# Patient Record
Sex: Female | Born: 1945 | Race: White | Hispanic: No | Smoking: Former smoker
Health system: Southern US, Community
[De-identification: ages and names within clinical notes are randomized; demographics above are authoritative.]

## PROBLEM LIST (undated history)

## (undated) DIAGNOSIS — F329 Major depressive disorder, single episode, unspecified: Secondary | ICD-10-CM

## (undated) DIAGNOSIS — R0600 Dyspnea, unspecified: Secondary | ICD-10-CM

## (undated) DIAGNOSIS — N289 Disorder of kidney and ureter, unspecified: Secondary | ICD-10-CM

## (undated) DIAGNOSIS — F32A Depression, unspecified: Secondary | ICD-10-CM

## (undated) DIAGNOSIS — E7800 Pure hypercholesterolemia, unspecified: Secondary | ICD-10-CM

## (undated) DIAGNOSIS — E039 Hypothyroidism, unspecified: Secondary | ICD-10-CM

## (undated) DIAGNOSIS — R413 Other amnesia: Secondary | ICD-10-CM

## (undated) DIAGNOSIS — J449 Chronic obstructive pulmonary disease, unspecified: Secondary | ICD-10-CM

## (undated) DIAGNOSIS — C801 Malignant (primary) neoplasm, unspecified: Secondary | ICD-10-CM

## (undated) HISTORY — DX: Hypothyroidism, unspecified: E03.9

## (undated) HISTORY — DX: Other amnesia: R41.3

## (undated) HISTORY — PX: TIBIA FRACTURE SURGERY: SHX806

## (undated) HISTORY — PX: ANKLE FRACTURE SURGERY: SHX122

## (undated) HISTORY — PX: OTHER SURGICAL HISTORY: SHX169

---

## 2006-09-27 ENCOUNTER — Ambulatory Visit (HOSPITAL_COMMUNITY): Admission: RE | Admit: 2006-09-27 | Discharge: 2006-09-27 | Payer: Self-pay | Admitting: Family Medicine

## 2006-10-16 ENCOUNTER — Ambulatory Visit: Payer: Self-pay | Admitting: Gastroenterology

## 2007-08-25 ENCOUNTER — Ambulatory Visit (HOSPITAL_COMMUNITY): Admission: RE | Admit: 2007-08-25 | Discharge: 2007-08-25 | Payer: Self-pay | Admitting: Gastroenterology

## 2007-08-25 ENCOUNTER — Ambulatory Visit: Payer: Self-pay | Admitting: Gastroenterology

## 2007-08-25 ENCOUNTER — Encounter: Payer: Self-pay | Admitting: Gastroenterology

## 2009-11-25 ENCOUNTER — Emergency Department (HOSPITAL_COMMUNITY): Admission: EM | Admit: 2009-11-25 | Discharge: 2009-11-25 | Payer: Self-pay | Admitting: Emergency Medicine

## 2011-04-17 NOTE — Op Note (Signed)
NAMEJAYLIANA, Laura Foster             ACCOUNT NO.:  [id number]   MEDICAL RECORD NO.:  [id number]          PATIENT TYPE:  AMB   LOCATION:  DAY                           FACILITY:  [facility]   PHYSICIAN:  Kassie Mends, M.D.      DATE OF BIRTH:  07/05/1946   DATE OF PROCEDURE:  08/25/2007  DATE OF DISCHARGE:                               OPERATIVE REPORT   PROCEDURE:  Colonoscopy with cold forceps and snare cautery polypectomy.   INDICATION FOR EXAM:  Laura Foster is a 66 year old female who presents  for average risk colon cancer screening.   FINDINGS:  1. Multiple sessile rectal polyps seen.  The polyps ranged from 4 mm      to 8 mm.  The polyps which were less than 6 mm were removed via      cold forceps.  The polyps over 6 mm were removed via snare cautery.  2. Rare sigmoid diverticulosis.  3. Otherwise no masses or inflammatory changes, AVMs or hemorrhoids      seen.   RECOMMENDATIONS:  1. She should follow a high fiber diet.  She is given a handout on      high-fiber diet polyps and diverticulosis.  2. Will call Ms. Turski with results of her biopsies.  If polyps      are adenomatous, then she needs a screening colonoscopy in 5 years.      As well her siblings and children need to begin colon cancer      screening at age 52 and then every 10 years, because her      adenomatous polyps would have been diagnosed after the age of 75.  3. No aspirin, NSAIDs or anticoagulation for 7 days.   MEDICATIONS:  1. Demerol 50 mg IV.  2. Versed 5 mg IV.   PROCEDURE TECHNIQUE:  Physical exam was performed.  Informed consent was  obtained from the patient after explaining the benefits, risks and  alternatives to the procedure.  The patient was connected to the monitor  and placed in the left lateral position.  Continuous oxygen was supplied  by nasal cannula.  IV medicine administered through an indwelling  cannula.  After administration of sedation and rectal exam, the  patient's rectum was  intubated.  The scope  was advanced under direct visualization to the cecum.  The scope was  removed slowly by carefully examine the color, texture, anatomy and  integrity of the mucosa on the way out.  The patient was recovered in  endoscopy and discharged home in satisfactory condition.      Kassie Mends, M.D.  Electronically Signed     SM/MEDQ  D:  08/25/2007  T:  08/25/2007  Job:  [id number]   cc:   Patrica Duel, M.D.  Fax: [phone]
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
## 2012-03-26 ENCOUNTER — Other Ambulatory Visit (HOSPITAL_COMMUNITY): Payer: Self-pay | Admitting: Internal Medicine

## 2012-03-26 DIAGNOSIS — Z139 Encounter for screening, unspecified: Secondary | ICD-10-CM

## 2012-03-26 DIAGNOSIS — IMO0002 Reserved for concepts with insufficient information to code with codable children: Secondary | ICD-10-CM | POA: Diagnosis not present

## 2012-03-26 DIAGNOSIS — K219 Gastro-esophageal reflux disease without esophagitis: Secondary | ICD-10-CM | POA: Diagnosis not present

## 2012-03-27 DIAGNOSIS — K219 Gastro-esophageal reflux disease without esophagitis: Secondary | ICD-10-CM | POA: Diagnosis not present

## 2012-03-27 DIAGNOSIS — Z79899 Other long term (current) drug therapy: Secondary | ICD-10-CM | POA: Diagnosis not present

## 2012-03-28 ENCOUNTER — Ambulatory Visit (HOSPITAL_COMMUNITY): Payer: Self-pay

## 2012-04-03 ENCOUNTER — Ambulatory Visit (HOSPITAL_COMMUNITY)
Admission: RE | Admit: 2012-04-03 | Discharge: 2012-04-03 | Disposition: A | Discharge status: Home / Self Care | Payer: Medicare Other | Source: Ambulatory Visit | Attending: Internal Medicine | Admitting: Internal Medicine

## 2012-04-03 DIAGNOSIS — Z13820 Encounter for screening for osteoporosis: Secondary | ICD-10-CM | POA: Diagnosis not present

## 2012-04-03 DIAGNOSIS — Z1231 Encounter for screening mammogram for malignant neoplasm of breast: Secondary | ICD-10-CM | POA: Insufficient documentation

## 2012-04-03 DIAGNOSIS — Z139 Encounter for screening, unspecified: Secondary | ICD-10-CM

## 2012-04-03 DIAGNOSIS — M810 Age-related osteoporosis without current pathological fracture: Secondary | ICD-10-CM | POA: Diagnosis not present

## 2012-04-03 DIAGNOSIS — N63 Unspecified lump in unspecified breast: Secondary | ICD-10-CM | POA: Diagnosis not present

## 2012-04-03 DIAGNOSIS — Z780 Asymptomatic menopausal state: Secondary | ICD-10-CM | POA: Diagnosis not present

## 2012-04-07 ENCOUNTER — Other Ambulatory Visit: Payer: Self-pay | Admitting: Internal Medicine

## 2012-04-07 DIAGNOSIS — R928 Other abnormal and inconclusive findings on diagnostic imaging of breast: Secondary | ICD-10-CM

## 2012-04-16 ENCOUNTER — Ambulatory Visit (HOSPITAL_COMMUNITY)
Admission: RE | Admit: 2012-04-16 | Discharge: 2012-04-16 | Disposition: A | Discharge status: Home / Self Care | Payer: Medicare Other | Source: Ambulatory Visit | Attending: Internal Medicine | Admitting: Internal Medicine

## 2012-04-16 DIAGNOSIS — R928 Other abnormal and inconclusive findings on diagnostic imaging of breast: Secondary | ICD-10-CM | POA: Diagnosis not present

## 2012-04-30 DIAGNOSIS — IMO0002 Reserved for concepts with insufficient information to code with codable children: Secondary | ICD-10-CM | POA: Diagnosis not present

## 2012-04-30 DIAGNOSIS — E039 Hypothyroidism, unspecified: Secondary | ICD-10-CM | POA: Diagnosis not present

## 2012-06-13 DIAGNOSIS — E0590 Thyrotoxicosis, unspecified without thyrotoxic crisis or storm: Secondary | ICD-10-CM | POA: Diagnosis not present

## 2012-06-17 DIAGNOSIS — E039 Hypothyroidism, unspecified: Secondary | ICD-10-CM | POA: Insufficient documentation

## 2012-06-20 DIAGNOSIS — E079 Disorder of thyroid, unspecified: Secondary | ICD-10-CM | POA: Diagnosis not present

## 2012-08-08 DIAGNOSIS — E079 Disorder of thyroid, unspecified: Secondary | ICD-10-CM | POA: Diagnosis not present

## 2012-08-15 ENCOUNTER — Other Ambulatory Visit (HOSPITAL_COMMUNITY): Payer: Self-pay | Admitting: "Endocrinology

## 2012-08-15 DIAGNOSIS — E0590 Thyrotoxicosis, unspecified without thyrotoxic crisis or storm: Secondary | ICD-10-CM | POA: Diagnosis not present

## 2012-08-18 ENCOUNTER — Ambulatory Visit (HOSPITAL_COMMUNITY)
Admission: RE | Admit: 2012-08-18 | Discharge: 2012-08-18 | Disposition: A | Discharge status: Home / Self Care | Payer: Medicare Other | Source: Ambulatory Visit | Attending: "Endocrinology | Admitting: "Endocrinology

## 2012-08-18 ENCOUNTER — Encounter (HOSPITAL_COMMUNITY): Payer: Self-pay

## 2012-08-18 DIAGNOSIS — E0590 Thyrotoxicosis, unspecified without thyrotoxic crisis or storm: Secondary | ICD-10-CM

## 2012-08-18 HISTORY — DX: Disorder of kidney and ureter, unspecified: N28.9

## 2012-08-18 MED ORDER — SODIUM IODIDE I 131 CAPSULE
10.0000 | Freq: Once | INTRAVENOUS | Status: AC | PRN
  Administered 2012-08-18: 11 via ORAL

## 2012-08-19 ENCOUNTER — Encounter (HOSPITAL_COMMUNITY)
Admission: RE | Admit: 2012-08-19 | Discharge: 2012-08-19 | Disposition: A | Discharge status: Home / Self Care | Payer: Medicare Other | Source: Ambulatory Visit | Attending: "Endocrinology | Admitting: "Endocrinology

## 2012-08-19 DIAGNOSIS — E0520 Thyrotoxicosis with toxic multinodular goiter without thyrotoxic crisis or storm: Secondary | ICD-10-CM | POA: Diagnosis not present

## 2012-08-19 MED ORDER — SODIUM PERTECHNETATE TC 99M INJECTION
10.0000 | Freq: Once | INTRAVENOUS | Status: AC | PRN
  Administered 2012-08-19: 10 via INTRAVENOUS

## 2012-08-26 DIAGNOSIS — H2589 Other age-related cataract: Secondary | ICD-10-CM | POA: Diagnosis not present

## 2012-08-29 ENCOUNTER — Other Ambulatory Visit (HOSPITAL_COMMUNITY): Payer: Self-pay | Admitting: "Endocrinology

## 2012-08-29 DIAGNOSIS — E0510 Thyrotoxicosis with toxic single thyroid nodule without thyrotoxic crisis or storm: Secondary | ICD-10-CM

## 2012-09-01 ENCOUNTER — Other Ambulatory Visit (HOSPITAL_COMMUNITY): Payer: Self-pay | Admitting: "Endocrinology

## 2012-09-01 DIAGNOSIS — E0510 Thyrotoxicosis with toxic single thyroid nodule without thyrotoxic crisis or storm: Secondary | ICD-10-CM

## 2012-09-02 ENCOUNTER — Ambulatory Visit (HOSPITAL_BASED_OUTPATIENT_CLINIC_OR_DEPARTMENT_OTHER): Admission: RE | Admit: 2012-09-02 | Payer: Medicare Other | Source: Ambulatory Visit

## 2012-09-02 ENCOUNTER — Encounter (HOSPITAL_COMMUNITY)
Admission: RE | Admit: 2012-09-02 | Discharge: 2012-09-02 | Disposition: A | Discharge status: Home / Self Care | Payer: Medicare Other | Source: Ambulatory Visit | Attending: "Endocrinology | Admitting: "Endocrinology

## 2012-09-02 ENCOUNTER — Encounter (HOSPITAL_COMMUNITY): Payer: Self-pay

## 2012-09-02 DIAGNOSIS — E0510 Thyrotoxicosis with toxic single thyroid nodule without thyrotoxic crisis or storm: Secondary | ICD-10-CM | POA: Diagnosis not present

## 2012-09-02 DIAGNOSIS — E0590 Thyrotoxicosis, unspecified without thyrotoxic crisis or storm: Secondary | ICD-10-CM | POA: Diagnosis not present

## 2012-09-02 MED ORDER — SODIUM IODIDE I 131 CAPSULE
30.0000 | Freq: Once | INTRAVENOUS | Status: AC | PRN
  Administered 2012-09-02: 30 via ORAL

## 2012-10-03 DIAGNOSIS — E0510 Thyrotoxicosis with toxic single thyroid nodule without thyrotoxic crisis or storm: Secondary | ICD-10-CM | POA: Diagnosis not present

## 2012-10-10 DIAGNOSIS — E0590 Thyrotoxicosis, unspecified without thyrotoxic crisis or storm: Secondary | ICD-10-CM | POA: Diagnosis not present

## 2012-11-07 DIAGNOSIS — E0590 Thyrotoxicosis, unspecified without thyrotoxic crisis or storm: Secondary | ICD-10-CM | POA: Diagnosis not present

## 2012-11-10 DIAGNOSIS — H2510 Age-related nuclear cataract, unspecified eye: Secondary | ICD-10-CM | POA: Diagnosis not present

## 2012-11-11 DIAGNOSIS — H2510 Age-related nuclear cataract, unspecified eye: Secondary | ICD-10-CM | POA: Diagnosis not present

## 2012-11-14 DIAGNOSIS — E018 Other iodine-deficiency related thyroid disorders and allied conditions: Secondary | ICD-10-CM | POA: Diagnosis not present

## 2012-11-19 ENCOUNTER — Ambulatory Visit: Payer: Self-pay | Admitting: Ophthalmology

## 2012-11-19 DIAGNOSIS — R0602 Shortness of breath: Secondary | ICD-10-CM | POA: Diagnosis not present

## 2012-11-19 DIAGNOSIS — H2510 Age-related nuclear cataract, unspecified eye: Secondary | ICD-10-CM | POA: Diagnosis not present

## 2012-11-19 DIAGNOSIS — Z79899 Other long term (current) drug therapy: Secondary | ICD-10-CM | POA: Diagnosis not present

## 2012-11-19 DIAGNOSIS — M810 Age-related osteoporosis without current pathological fracture: Secondary | ICD-10-CM | POA: Diagnosis not present

## 2012-11-19 DIAGNOSIS — F17200 Nicotine dependence, unspecified, uncomplicated: Secondary | ICD-10-CM | POA: Diagnosis not present

## 2012-11-19 DIAGNOSIS — R05 Cough: Secondary | ICD-10-CM | POA: Diagnosis not present

## 2012-11-19 DIAGNOSIS — K219 Gastro-esophageal reflux disease without esophagitis: Secondary | ICD-10-CM | POA: Diagnosis not present

## 2012-11-19 DIAGNOSIS — R059 Cough, unspecified: Secondary | ICD-10-CM | POA: Diagnosis not present

## 2012-11-19 DIAGNOSIS — R51 Headache: Secondary | ICD-10-CM | POA: Diagnosis not present

## 2012-11-19 DIAGNOSIS — E7800 Pure hypercholesterolemia, unspecified: Secondary | ICD-10-CM | POA: Diagnosis not present

## 2012-11-19 DIAGNOSIS — E079 Disorder of thyroid, unspecified: Secondary | ICD-10-CM | POA: Diagnosis not present

## 2012-11-21 DIAGNOSIS — J343 Hypertrophy of nasal turbinates: Secondary | ICD-10-CM | POA: Diagnosis not present

## 2012-11-21 DIAGNOSIS — R05 Cough: Secondary | ICD-10-CM | POA: Diagnosis not present

## 2012-11-21 DIAGNOSIS — R059 Cough, unspecified: Secondary | ICD-10-CM | POA: Diagnosis not present

## 2013-02-10 DIAGNOSIS — E018 Other iodine-deficiency related thyroid disorders and allied conditions: Secondary | ICD-10-CM | POA: Diagnosis not present

## 2013-02-19 DIAGNOSIS — E018 Other iodine-deficiency related thyroid disorders and allied conditions: Secondary | ICD-10-CM | POA: Diagnosis not present

## 2013-03-12 ENCOUNTER — Other Ambulatory Visit (HOSPITAL_COMMUNITY): Payer: Self-pay | Admitting: Internal Medicine

## 2013-03-12 DIAGNOSIS — Z139 Encounter for screening, unspecified: Secondary | ICD-10-CM

## 2013-04-06 ENCOUNTER — Ambulatory Visit (HOSPITAL_COMMUNITY)
Admission: RE | Admit: 2013-04-06 | Discharge: 2013-04-06 | Disposition: A | Discharge status: Home / Self Care | Payer: Medicare Other | Source: Ambulatory Visit | Attending: Internal Medicine | Admitting: Internal Medicine

## 2013-04-06 DIAGNOSIS — Z1231 Encounter for screening mammogram for malignant neoplasm of breast: Secondary | ICD-10-CM | POA: Insufficient documentation

## 2013-04-06 DIAGNOSIS — Z139 Encounter for screening, unspecified: Secondary | ICD-10-CM

## 2013-05-08 DIAGNOSIS — Z719 Counseling, unspecified: Secondary | ICD-10-CM | POA: Diagnosis not present

## 2013-05-08 DIAGNOSIS — J209 Acute bronchitis, unspecified: Secondary | ICD-10-CM | POA: Diagnosis not present

## 2013-05-08 DIAGNOSIS — IMO0002 Reserved for concepts with insufficient information to code with codable children: Secondary | ICD-10-CM | POA: Diagnosis not present

## 2013-05-25 DIAGNOSIS — IMO0002 Reserved for concepts with insufficient information to code with codable children: Secondary | ICD-10-CM | POA: Diagnosis not present

## 2013-05-25 DIAGNOSIS — L259 Unspecified contact dermatitis, unspecified cause: Secondary | ICD-10-CM | POA: Diagnosis not present

## 2013-06-18 DIAGNOSIS — E079 Disorder of thyroid, unspecified: Secondary | ICD-10-CM | POA: Diagnosis not present

## 2013-06-18 DIAGNOSIS — E0510 Thyrotoxicosis with toxic single thyroid nodule without thyrotoxic crisis or storm: Secondary | ICD-10-CM | POA: Diagnosis not present

## 2013-06-25 DIAGNOSIS — E018 Other iodine-deficiency related thyroid disorders and allied conditions: Secondary | ICD-10-CM | POA: Diagnosis not present

## 2013-06-29 DIAGNOSIS — Z961 Presence of intraocular lens: Secondary | ICD-10-CM | POA: Diagnosis not present

## 2013-06-29 DIAGNOSIS — H2510 Age-related nuclear cataract, unspecified eye: Secondary | ICD-10-CM | POA: Diagnosis not present

## 2013-09-01 DIAGNOSIS — H2589 Other age-related cataract: Secondary | ICD-10-CM | POA: Diagnosis not present

## 2013-10-12 DIAGNOSIS — Z719 Counseling, unspecified: Secondary | ICD-10-CM | POA: Diagnosis not present

## 2013-10-12 DIAGNOSIS — J209 Acute bronchitis, unspecified: Secondary | ICD-10-CM | POA: Diagnosis not present

## 2013-10-12 DIAGNOSIS — IMO0002 Reserved for concepts with insufficient information to code with codable children: Secondary | ICD-10-CM | POA: Diagnosis not present

## 2013-10-19 DIAGNOSIS — E018 Other iodine-deficiency related thyroid disorders and allied conditions: Secondary | ICD-10-CM | POA: Diagnosis not present

## 2013-10-26 DIAGNOSIS — E018 Other iodine-deficiency related thyroid disorders and allied conditions: Secondary | ICD-10-CM | POA: Diagnosis not present

## 2014-03-15 ENCOUNTER — Other Ambulatory Visit (HOSPITAL_COMMUNITY): Payer: Self-pay | Admitting: Family Medicine

## 2014-03-15 DIAGNOSIS — Z1231 Encounter for screening mammogram for malignant neoplasm of breast: Secondary | ICD-10-CM

## 2014-03-16 DIAGNOSIS — IMO0002 Reserved for concepts with insufficient information to code with codable children: Secondary | ICD-10-CM | POA: Diagnosis not present

## 2014-03-16 DIAGNOSIS — Z0000 Encounter for general adult medical examination without abnormal findings: Secondary | ICD-10-CM | POA: Diagnosis not present

## 2014-03-16 DIAGNOSIS — Z01419 Encounter for gynecological examination (general) (routine) without abnormal findings: Secondary | ICD-10-CM | POA: Diagnosis not present

## 2014-03-16 DIAGNOSIS — Z23 Encounter for immunization: Secondary | ICD-10-CM | POA: Diagnosis not present

## 2014-03-26 DIAGNOSIS — IMO0002 Reserved for concepts with insufficient information to code with codable children: Secondary | ICD-10-CM | POA: Diagnosis not present

## 2014-03-26 DIAGNOSIS — F4321 Adjustment disorder with depressed mood: Secondary | ICD-10-CM | POA: Diagnosis not present

## 2014-04-08 ENCOUNTER — Ambulatory Visit (HOSPITAL_COMMUNITY)
Admission: RE | Admit: 2014-04-08 | Discharge: 2014-04-08 | Disposition: A | Discharge status: Home / Self Care | Payer: Medicare Other | Source: Ambulatory Visit | Attending: Family Medicine | Admitting: Family Medicine

## 2014-04-08 DIAGNOSIS — Z1231 Encounter for screening mammogram for malignant neoplasm of breast: Secondary | ICD-10-CM | POA: Insufficient documentation

## 2014-04-27 DIAGNOSIS — E785 Hyperlipidemia, unspecified: Secondary | ICD-10-CM | POA: Diagnosis not present

## 2014-04-27 DIAGNOSIS — E559 Vitamin D deficiency, unspecified: Secondary | ICD-10-CM | POA: Diagnosis not present

## 2014-04-27 DIAGNOSIS — E018 Other iodine-deficiency related thyroid disorders and allied conditions: Secondary | ICD-10-CM | POA: Diagnosis not present

## 2014-05-03 DIAGNOSIS — E018 Other iodine-deficiency related thyroid disorders and allied conditions: Secondary | ICD-10-CM | POA: Diagnosis not present

## 2014-05-24 DIAGNOSIS — H2510 Age-related nuclear cataract, unspecified eye: Secondary | ICD-10-CM | POA: Diagnosis not present

## 2014-09-22 DIAGNOSIS — Z6823 Body mass index (BMI) 23.0-23.9, adult: Secondary | ICD-10-CM | POA: Diagnosis not present

## 2014-09-22 DIAGNOSIS — Z719 Counseling, unspecified: Secondary | ICD-10-CM | POA: Diagnosis not present

## 2014-09-22 DIAGNOSIS — Z720 Tobacco use: Secondary | ICD-10-CM | POA: Diagnosis not present

## 2014-09-22 DIAGNOSIS — E063 Autoimmune thyroiditis: Secondary | ICD-10-CM | POA: Diagnosis not present

## 2014-09-22 DIAGNOSIS — E782 Mixed hyperlipidemia: Secondary | ICD-10-CM | POA: Diagnosis not present

## 2014-09-22 DIAGNOSIS — Z23 Encounter for immunization: Secondary | ICD-10-CM | POA: Diagnosis not present

## 2014-11-03 DIAGNOSIS — E063 Autoimmune thyroiditis: Secondary | ICD-10-CM | POA: Diagnosis not present

## 2014-11-03 DIAGNOSIS — R7301 Impaired fasting glucose: Secondary | ICD-10-CM | POA: Diagnosis not present

## 2014-11-03 DIAGNOSIS — E782 Mixed hyperlipidemia: Secondary | ICD-10-CM | POA: Diagnosis not present

## 2014-11-03 DIAGNOSIS — Z6823 Body mass index (BMI) 23.0-23.9, adult: Secondary | ICD-10-CM | POA: Diagnosis not present

## 2014-11-09 DIAGNOSIS — E063 Autoimmune thyroiditis: Secondary | ICD-10-CM | POA: Diagnosis not present

## 2014-11-09 DIAGNOSIS — Z6823 Body mass index (BMI) 23.0-23.9, adult: Secondary | ICD-10-CM | POA: Diagnosis not present

## 2014-11-09 DIAGNOSIS — E782 Mixed hyperlipidemia: Secondary | ICD-10-CM | POA: Diagnosis not present

## 2014-11-09 DIAGNOSIS — R7301 Impaired fasting glucose: Secondary | ICD-10-CM | POA: Diagnosis not present

## 2014-11-16 DIAGNOSIS — E032 Hypothyroidism due to medicaments and other exogenous substances: Secondary | ICD-10-CM | POA: Diagnosis not present

## 2015-03-02 DIAGNOSIS — J301 Allergic rhinitis due to pollen: Secondary | ICD-10-CM | POA: Diagnosis not present

## 2015-03-02 DIAGNOSIS — Z6822 Body mass index (BMI) 22.0-22.9, adult: Secondary | ICD-10-CM | POA: Diagnosis not present

## 2015-03-02 DIAGNOSIS — F419 Anxiety disorder, unspecified: Secondary | ICD-10-CM | POA: Diagnosis not present

## 2015-03-08 ENCOUNTER — Other Ambulatory Visit (HOSPITAL_COMMUNITY): Payer: Self-pay | Admitting: Family Medicine

## 2015-03-08 DIAGNOSIS — Z1231 Encounter for screening mammogram for malignant neoplasm of breast: Secondary | ICD-10-CM

## 2015-03-31 DIAGNOSIS — Z6821 Body mass index (BMI) 21.0-21.9, adult: Secondary | ICD-10-CM | POA: Diagnosis not present

## 2015-03-31 DIAGNOSIS — N6459 Other signs and symptoms in breast: Secondary | ICD-10-CM | POA: Diagnosis not present

## 2015-03-31 DIAGNOSIS — E782 Mixed hyperlipidemia: Secondary | ICD-10-CM | POA: Diagnosis not present

## 2015-03-31 DIAGNOSIS — R7301 Impaired fasting glucose: Secondary | ICD-10-CM | POA: Diagnosis not present

## 2015-03-31 DIAGNOSIS — Z0000 Encounter for general adult medical examination without abnormal findings: Secondary | ICD-10-CM | POA: Diagnosis not present

## 2015-04-08 ENCOUNTER — Ambulatory Visit (HOSPITAL_COMMUNITY)
Admission: RE | Admit: 2015-04-08 | Discharge: 2015-04-08 | Disposition: A | Discharge status: Home / Self Care | Payer: Medicare Other | Source: Ambulatory Visit | Attending: Family Medicine | Admitting: Family Medicine

## 2015-04-08 DIAGNOSIS — Z1231 Encounter for screening mammogram for malignant neoplasm of breast: Secondary | ICD-10-CM

## 2015-04-11 ENCOUNTER — Ambulatory Visit (HOSPITAL_COMMUNITY)
Admission: RE | Admit: 2015-04-11 | Discharge: 2015-04-11 | Disposition: A | Discharge status: Home / Self Care | Payer: Medicare Other | Source: Ambulatory Visit | Attending: Family Medicine | Admitting: Family Medicine

## 2015-04-11 DIAGNOSIS — Z1231 Encounter for screening mammogram for malignant neoplasm of breast: Secondary | ICD-10-CM | POA: Insufficient documentation

## 2015-04-28 DIAGNOSIS — Z6822 Body mass index (BMI) 22.0-22.9, adult: Secondary | ICD-10-CM | POA: Diagnosis not present

## 2015-04-28 DIAGNOSIS — D473 Essential (hemorrhagic) thrombocythemia: Secondary | ICD-10-CM | POA: Diagnosis not present

## 2015-05-10 DIAGNOSIS — Z1211 Encounter for screening for malignant neoplasm of colon: Secondary | ICD-10-CM | POA: Diagnosis not present

## 2015-05-11 DIAGNOSIS — E032 Hypothyroidism due to medicaments and other exogenous substances: Secondary | ICD-10-CM | POA: Diagnosis not present

## 2015-05-11 NOTE — H&P (Signed)
  NTS SOAP Note  Vital Signs:  Vitals as of: 07/05/2918: Systolic 166: Diastolic 82: Heart Rate 77: Temp 65F: Height 68ft 3in: Weight 134Lbs 0 Ounces: BMI 23.74  BMI : 23.74 kg/m2  Subjective: This 69 year old female presents for of need for screening TCS.  Last had a colonscopy many years ago.  No gi complaints.  No family h/o colon carcinoma.  Review of Symptoms:  Constitutional:unremarkable   Head:unremarkable Eyes:unremarkable   Nose/Mouth/Throat:unremarkable Cardiovascular:  unremarkable Respiratory:unremarkable Gastrointestinal:  unremarkable   Genitourinary:unremarkable   Musculoskeletal:unremarkable Skin:unremarkable Hematolgic/Lymphatic:unremarkable   Allergic/Immunologic:unremarkable   Past Medical History:  Reviewed  Past Medical History  Surgical History: none Medical Problems: high cholesterol, hypothyroidism Allergies: nkda Medications: levothyroxine, alprazolam, atorvastatin, alendronate   Social History:Reviewed  Social History  Preferred Language: English Race:  White Ethnicity: Not Hispanic / Latino Age: 61 year Marital Status:  M Alcohol: no   Smoking Status: Current every day smoker reviewed on 05/10/2015 Started Date:  Packs per day: 1.00 Functional Status reviewed on 05/10/2015 ------------------------------------------------ Bathing: Normal Cooking: Normal Dressing: Normal Driving: Normal Eating: Normal Managing Meds: Normal Oral Care: Normal Shopping: Normal Toileting: Normal Transferring: Normal Walking: Normal Cognitive Status reviewed on 05/10/2015 ------------------------------------------------ Attention: Normal Decision Making: Normal Language: Normal Memory: Normal Motor: Normal Perception: Normal Problem Solving: Normal Visual and Spatial: Normal   Family History:Reviewed  Family Health History Mother, Deceased; Healthy;  Father, Deceased; Heart disease;     Objective  Information: General:Well appearing, well nourished in no distress. Heart:RRR, no murmur Lungs:  CTA bilaterally, no wheezes, rhonchi, rales.  Breathing unlabored. Abdomen:Soft, NT/ND, no HSM, no masses. deferred to procedure  Assessment:Need for screening TCS  Diagnoses: V76.51  Z12.11 Screening for malignant neoplasm of colon (Encounter for screening for malignant neoplasm of colon)  Procedures: 06004 - OFFICE OUTPATIENT NEW 20 MINUTES    Plan:  Scheduled for TCS on 05/17/15.   Patient Education:Alternative treatments to surgery were discussed with patient (and family).  Risks and benefits  of procedure including bleeding and perforation were fully explained to the patient (and family) who gave informed consent. Patient/family questions were addressed.  Follow-up:Pending Surgery

## 2015-05-17 ENCOUNTER — Ambulatory Visit (HOSPITAL_COMMUNITY)
Admission: RE | Admit: 2015-05-17 | Discharge: 2015-05-17 | Disposition: A | Discharge status: Home / Self Care | Payer: Medicare Other | Source: Ambulatory Visit | Attending: General Surgery | Admitting: General Surgery

## 2015-05-17 ENCOUNTER — Encounter (HOSPITAL_COMMUNITY): Payer: Self-pay | Admitting: *Deleted

## 2015-05-17 ENCOUNTER — Encounter (HOSPITAL_COMMUNITY)
Admission: RE | Disposition: A | Discharge status: Home / Self Care | Payer: Self-pay | Source: Ambulatory Visit | Attending: General Surgery

## 2015-05-17 DIAGNOSIS — Z79899 Other long term (current) drug therapy: Secondary | ICD-10-CM | POA: Insufficient documentation

## 2015-05-17 DIAGNOSIS — F17210 Nicotine dependence, cigarettes, uncomplicated: Secondary | ICD-10-CM | POA: Diagnosis not present

## 2015-05-17 DIAGNOSIS — E780 Pure hypercholesterolemia: Secondary | ICD-10-CM | POA: Diagnosis not present

## 2015-05-17 DIAGNOSIS — Z1211 Encounter for screening for malignant neoplasm of colon: Secondary | ICD-10-CM | POA: Insufficient documentation

## 2015-05-17 DIAGNOSIS — K5730 Diverticulosis of large intestine without perforation or abscess without bleeding: Secondary | ICD-10-CM | POA: Diagnosis not present

## 2015-05-17 DIAGNOSIS — E039 Hypothyroidism, unspecified: Secondary | ICD-10-CM | POA: Diagnosis not present

## 2015-05-17 HISTORY — PX: COLONOSCOPY: SHX5424

## 2015-05-17 SURGERY — COLONOSCOPY
Anesthesia: Moderate Sedation

## 2015-05-17 MED ORDER — MEPERIDINE HCL 50 MG/ML IJ SOLN
INTRAMUSCULAR | Status: DC | PRN
  Administered 2015-05-17: 50 mg via INTRAVENOUS

## 2015-05-17 MED ORDER — MEPERIDINE HCL 50 MG/ML IJ SOLN
INTRAMUSCULAR | Status: DC
Start: 2015-05-17 — End: 2015-05-17
  Filled 2015-05-17: qty 1

## 2015-05-17 MED ORDER — MIDAZOLAM HCL 5 MG/5ML IJ SOLN
INTRAMUSCULAR | Status: AC
  Filled 2015-05-17: qty 5

## 2015-05-17 MED ORDER — MIDAZOLAM HCL 5 MG/5ML IJ SOLN
INTRAMUSCULAR | Status: DC | PRN
  Administered 2015-05-17: 3 mg via INTRAVENOUS
  Administered 2015-05-17: 1 mg via INTRAVENOUS

## 2015-05-17 MED ORDER — STERILE WATER FOR IRRIGATION IR SOLN
Status: DC | PRN
  Administered 2015-05-17: 08:00:00

## 2015-05-17 MED ORDER — SODIUM CHLORIDE 0.9 % IV SOLN
INTRAVENOUS | Status: DC
  Administered 2015-05-17: 07:00:00 via INTRAVENOUS

## 2015-05-17 NOTE — Discharge Instructions (Signed)
Diverticulosis °Diverticulosis is the condition that develops when small pouches (diverticula) form in the wall of your colon. Your colon, or large intestine, is where water is absorbed and stool is formed. The pouches form when the inside layer of your colon pushes through weak spots in the outer layers of your colon. °CAUSES  °No one knows exactly what causes diverticulosis. °RISK FACTORS °· Being older than 50. Your risk for this condition increases with age. Diverticulosis is rare in people younger than 40 years. By age 80, almost everyone has it. °· Eating a low-fiber diet. °· Being frequently constipated. °· Being overweight. °· Not getting enough exercise. °· Smoking. °· Taking over-the-counter pain medicines, like aspirin and ibuprofen. °SYMPTOMS  °Most people with diverticulosis do not have symptoms. °DIAGNOSIS  °Because diverticulosis often has no symptoms, health care providers often discover the condition during an exam for other colon problems. In many cases, a health care provider will diagnose diverticulosis while using a flexible scope to examine the colon (colonoscopy). °TREATMENT  °If you have never developed an infection related to diverticulosis, you may not need treatment. If you have had an infection before, treatment may include: °· Eating more fruits, vegetables, and grains. °· Taking a fiber supplement. °· Taking a live bacteria supplement (probiotic). °· Taking medicine to relax your colon. °HOME CARE INSTRUCTIONS  °· Drink at least 6-8 glasses of water each day to prevent constipation. °· Try not to strain when you have a bowel movement. °· Keep all follow-up appointments. °If you have had an infection before:  °· Increase the fiber in your diet as directed by your health care provider or dietitian. °· Take a dietary fiber supplement if your health care provider approves. °· Only take medicines as directed by your health care provider. °SEEK MEDICAL CARE IF:  °· You have abdominal  pain. °· You have bloating. °· You have cramps. °· You have not gone to the bathroom in 3 days. °SEEK IMMEDIATE MEDICAL CARE IF:  °· Your pain gets worse. °· Your bloating becomes very bad. °· You have a fever or chills, and your symptoms suddenly get worse. °· You begin vomiting. °· You have bowel movements that are bloody or black. °MAKE SURE YOU: °· Understand these instructions. °· Will watch your condition. °· Will get help right away if you are not doing well or get worse. °Document Released: 08/16/2004 Document Revised: 11/24/2013 Document Reviewed: 10/14/2013 °ExitCare® Patient Information ©2015 [employer]. This information is not intended to replace advice given to you by your health care provider. Make sure you discuss any questions you have with your health care provider. °Colonoscopy, Care After °Refer to this sheet in the next few weeks. These instructions provide you with information on caring for yourself after your procedure. Your health care provider may also give you more specific instructions. Your treatment has been planned according to current medical practices, but problems sometimes occur. Call your health care provider if you have any problems or questions after your procedure. °WHAT TO EXPECT AFTER THE PROCEDURE  °After your procedure, it is typical to have the following: °· A small amount of blood in your stool. °· Moderate amounts of gas and mild abdominal cramping or bloating. °HOME CARE INSTRUCTIONS °· Do not drive, operate machinery, or sign important documents for 24 hours. °· You may shower and resume your regular physical activities, but move at a slower pace for the first 24 hours. °· Take frequent rest periods for the first 24 hours. °· Walk   around or put a warm pack on your abdomen to help reduce abdominal cramping and bloating. °· Drink enough fluids to keep your urine clear or pale yellow. °· You may resume your normal diet as instructed by your health care provider. Avoid  heavy or fried foods that are hard to digest. °· Avoid drinking alcohol for 24 hours or as instructed by your health care provider. °· Only take over-the-counter or prescription medicines as directed by your health care provider. °· If a tissue sample (biopsy) was taken during your procedure: °¨ Do not take aspirin or blood thinners for 7 days, or as instructed by your health care provider. °¨ Do not drink alcohol for 7 days, or as instructed by your health care provider. °¨ Eat soft foods for the first 24 hours. °SEEK MEDICAL CARE IF: °You have persistent spotting of blood in your stool 2-3 days after the procedure. °SEEK IMMEDIATE MEDICAL CARE IF: °· You have more than a small spotting of blood in your stool. °· You pass large blood clots in your stool. °· Your abdomen is swollen (distended). °· You have nausea or vomiting. °· You have a fever. °· You have increasing abdominal pain that is not relieved with medicine. °Document Released: 07/03/2004 Document Revised: 09/09/2013 Document Reviewed: 07/27/2013 °ExitCare® Patient Information ©2015 [employer]. This information is not intended to replace advice given to you by your health care provider. Make sure you discuss any questions you have with your health care provider. ° °

## 2015-05-17 NOTE — Op Note (Signed)
[facility] [address] [place], [address]   COLONOSCOPY PROCEDURE REPORT     EXAM DATE: 06-03-15  PATIENT NAME:      Laura Foster, Laura Foster           MR #:      [id number]  BIRTHDATE:       January 05, 1946      VISIT #:     [phone]  ATTENDING:     Aviva Signs, MD     STATUS:     outpatient ASSISTANT:  INDICATIONS:  The patient is a 69 yr old female here for a colonoscopy due to average risk patient for colon cancer. PROCEDURE PERFORMED:     Colonoscopy, screening MEDICATIONS:     Demerol 50 mg IV and Versed 4 mg IV ESTIMATED BLOOD LOSS:     None  CONSENT: The patient understands the risks and benefits of the procedure and understands that these risks include, but are not limited to: sedation, allergic reaction, infection, perforation and/or bleeding. Alternative means of evaluation and treatment include, among others: physical exam, x-rays, and/or surgical intervention. The patient elects to proceed with this endoscopic procedure.  DESCRIPTION OF PROCEDURE: During intra-op preparation period all mechanical & medical equipment was checked for proper function. Hand hygiene and appropriate measures for infection prevention was taken. After the risks, benefits and alternatives of the procedure were thoroughly explained, Informed consent was verified, confirmed and timeout was successfully executed by the treatment team. A digital exam revealed no abnormalities of the rectum. The [id number] ([id number]) endoscope was introduced through the anus and advanced to the cecum, which was identified by both the appendix and ileocecal valve. adequate (Trilyte was used) The instrument was then slowly withdrawn as the colon was fully examined.Estimated blood loss is zero unless otherwise noted in this procedure report.   COLON FINDINGS: There was mild diverticulosis noted in the sigmoid colon.   The examination was otherwise normal. Retroflexed views revealed no  abnormalities. The scope was then completely withdrawn from the patient and the procedure terminated. SCOPE WITHDRAWAL TIME:    ADVERSE EVENTS:      There were no immediate complications.  IMPRESSIONS:     1.  Mild diverticulosis was noted in the sigmoid colon 2.  The examination was otherwise normal  RECOMMENDATIONS:     OP follow-up is advised on a PRN basis. RECALL:  _____________________________ Aviva Signs, MD eSigned:  Aviva Signs, MD 2015/06/03 7:50 AM   cc:   CPT CODES: ICD CODES:  The ICD and CPT codes recommended by this software are interpretations from the data that the clinical staff has captured with the software.  The verification of the translation of this report to the ICD and CPT codes and modifiers is the sole responsibility of the health care institution and practicing physician where this report was generated.  [employer] will not be held responsible for the validity of the ICD and CPT codes included on this report.  AMA assumes no liability for data contained or not contained herein. CPT is a Designer, television/film set of the [employer].

## 2015-05-17 NOTE — Interval H&P Note (Signed)
History and Physical Interval Note:  05/17/2015 7:18 AM  Laura Foster  has presented today for surgery, with the diagnosis of screening  The various methods of treatment have been discussed with the patient and family. After consideration of risks, benefits and other options for treatment, the patient has consented to  Procedure(s): COLONOSCOPY (N/A) as a surgical intervention .  The patient's history has been reviewed, patient examined, no change in status, stable for surgery.  I have reviewed the patient's chart and labs.  Questions were answered to the patient's satisfaction.     Aviva Signs A

## 2015-05-18 DIAGNOSIS — E032 Hypothyroidism due to medicaments and other exogenous substances: Secondary | ICD-10-CM | POA: Diagnosis not present

## 2015-05-19 ENCOUNTER — Encounter (HOSPITAL_COMMUNITY): Payer: Self-pay | Admitting: General Surgery

## 2015-05-31 DIAGNOSIS — H2512 Age-related nuclear cataract, left eye: Secondary | ICD-10-CM | POA: Diagnosis not present

## 2015-08-05 DIAGNOSIS — Z1389 Encounter for screening for other disorder: Secondary | ICD-10-CM | POA: Diagnosis not present

## 2015-08-05 DIAGNOSIS — F419 Anxiety disorder, unspecified: Secondary | ICD-10-CM | POA: Diagnosis not present

## 2015-08-05 DIAGNOSIS — Z6823 Body mass index (BMI) 23.0-23.9, adult: Secondary | ICD-10-CM | POA: Diagnosis not present

## 2015-11-10 ENCOUNTER — Other Ambulatory Visit: Payer: Self-pay | Admitting: "Endocrinology

## 2015-11-10 DIAGNOSIS — E032 Hypothyroidism due to medicaments and other exogenous substances: Secondary | ICD-10-CM | POA: Diagnosis not present

## 2015-11-11 LAB — TSH+FREE T4
FREE T4: 1.62 ng/dL (ref 0.82–1.77)
TSH: 3.64 u[IU]/mL (ref 0.450–4.500)

## 2015-11-17 ENCOUNTER — Encounter: Payer: Self-pay | Admitting: "Endocrinology

## 2015-11-17 ENCOUNTER — Ambulatory Visit (INDEPENDENT_AMBULATORY_CARE_PROVIDER_SITE_OTHER): Payer: Medicare Other | Admitting: "Endocrinology

## 2015-11-17 VITALS — BP 122/81 | HR 96 | Ht 62.0 in | Wt 141.0 lb

## 2015-11-17 DIAGNOSIS — E032 Hypothyroidism due to medicaments and other exogenous substances: Secondary | ICD-10-CM

## 2015-11-17 DIAGNOSIS — E890 Postprocedural hypothyroidism: Secondary | ICD-10-CM | POA: Insufficient documentation

## 2015-11-17 MED ORDER — LEVOTHYROXINE SODIUM 75 MCG PO TABS: 75.0000 ug | ORAL_TABLET | Freq: Every day | ORAL | Status: DC
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
## 2016-01-30 DIAGNOSIS — J019 Acute sinusitis, unspecified: Secondary | ICD-10-CM | POA: Diagnosis not present

## 2016-01-30 DIAGNOSIS — Z6823 Body mass index (BMI) 23.0-23.9, adult: Secondary | ICD-10-CM | POA: Diagnosis not present

## 2016-01-30 DIAGNOSIS — J189 Pneumonia, unspecified organism: Secondary | ICD-10-CM | POA: Diagnosis not present

## 2016-01-30 DIAGNOSIS — E782 Mixed hyperlipidemia: Secondary | ICD-10-CM | POA: Diagnosis not present

## 2016-01-30 DIAGNOSIS — Z1389 Encounter for screening for other disorder: Secondary | ICD-10-CM | POA: Diagnosis not present

## 2016-01-30 DIAGNOSIS — E063 Autoimmune thyroiditis: Secondary | ICD-10-CM | POA: Diagnosis not present

## 2016-01-30 DIAGNOSIS — F419 Anxiety disorder, unspecified: Secondary | ICD-10-CM | POA: Diagnosis not present

## 2016-04-16 DIAGNOSIS — Z6822 Body mass index (BMI) 22.0-22.9, adult: Secondary | ICD-10-CM | POA: Diagnosis not present

## 2016-04-16 DIAGNOSIS — Z0000 Encounter for general adult medical examination without abnormal findings: Secondary | ICD-10-CM | POA: Diagnosis not present

## 2016-04-16 DIAGNOSIS — Z1389 Encounter for screening for other disorder: Secondary | ICD-10-CM | POA: Diagnosis not present

## 2016-07-09 ENCOUNTER — Other Ambulatory Visit (HOSPITAL_COMMUNITY): Payer: Self-pay | Admitting: Family Medicine

## 2016-07-09 ENCOUNTER — Ambulatory Visit (HOSPITAL_COMMUNITY)
Admission: RE | Admit: 2016-07-09 | Discharge: 2016-07-09 | Disposition: A | Discharge status: Home / Self Care | Payer: Medicare Other | Source: Ambulatory Visit | Attending: Family Medicine | Admitting: Family Medicine

## 2016-07-09 DIAGNOSIS — J449 Chronic obstructive pulmonary disease, unspecified: Secondary | ICD-10-CM | POA: Insufficient documentation

## 2016-07-09 DIAGNOSIS — Z6822 Body mass index (BMI) 22.0-22.9, adult: Secondary | ICD-10-CM | POA: Diagnosis not present

## 2016-07-09 DIAGNOSIS — R918 Other nonspecific abnormal finding of lung field: Secondary | ICD-10-CM | POA: Insufficient documentation

## 2016-07-09 DIAGNOSIS — J45909 Unspecified asthma, uncomplicated: Secondary | ICD-10-CM | POA: Diagnosis not present

## 2016-11-09 ENCOUNTER — Other Ambulatory Visit: Payer: Self-pay | Admitting: "Endocrinology

## 2016-11-09 DIAGNOSIS — E032 Hypothyroidism due to medicaments and other exogenous substances: Secondary | ICD-10-CM | POA: Diagnosis not present

## 2016-11-10 LAB — T4, FREE: FREE T4: 1.46 ng/dL (ref 0.82–1.77)

## 2016-11-10 LAB — TSH: TSH: 1.6 u[IU]/mL (ref 0.450–4.500)

## 2016-11-16 ENCOUNTER — Encounter: Payer: Self-pay | Admitting: "Endocrinology

## 2016-11-16 ENCOUNTER — Ambulatory Visit (INDEPENDENT_AMBULATORY_CARE_PROVIDER_SITE_OTHER): Payer: Medicare Other | Admitting: "Endocrinology

## 2016-11-16 VITALS — BP 124/75 | HR 86 | Ht 62.0 in | Wt 134.0 lb

## 2016-11-16 DIAGNOSIS — E890 Postprocedural hypothyroidism: Secondary | ICD-10-CM | POA: Diagnosis not present

## 2016-11-16 MED ORDER — LEVOTHYROXINE SODIUM 75 MCG PO TABS: 75.0000 ug | ORAL_TABLET | Freq: Every day | ORAL | 4 refills | Status: DC
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
## 2016-12-05 DIAGNOSIS — R7309 Other abnormal glucose: Secondary | ICD-10-CM | POA: Diagnosis not present

## 2016-12-05 DIAGNOSIS — F419 Anxiety disorder, unspecified: Secondary | ICD-10-CM | POA: Diagnosis not present

## 2016-12-05 DIAGNOSIS — Z1389 Encounter for screening for other disorder: Secondary | ICD-10-CM | POA: Diagnosis not present

## 2016-12-05 DIAGNOSIS — E782 Mixed hyperlipidemia: Secondary | ICD-10-CM | POA: Diagnosis not present

## 2016-12-05 DIAGNOSIS — J449 Chronic obstructive pulmonary disease, unspecified: Secondary | ICD-10-CM | POA: Diagnosis not present

## 2016-12-05 DIAGNOSIS — Z6821 Body mass index (BMI) 21.0-21.9, adult: Secondary | ICD-10-CM | POA: Diagnosis not present

## 2017-05-17 DIAGNOSIS — Z6820 Body mass index (BMI) 20.0-20.9, adult: Secondary | ICD-10-CM | POA: Diagnosis not present

## 2017-05-17 DIAGNOSIS — E063 Autoimmune thyroiditis: Secondary | ICD-10-CM | POA: Diagnosis not present

## 2017-05-17 DIAGNOSIS — Z79899 Other long term (current) drug therapy: Secondary | ICD-10-CM | POA: Diagnosis not present

## 2017-05-17 DIAGNOSIS — F419 Anxiety disorder, unspecified: Secondary | ICD-10-CM | POA: Diagnosis not present

## 2017-05-17 DIAGNOSIS — E782 Mixed hyperlipidemia: Secondary | ICD-10-CM | POA: Diagnosis not present

## 2017-10-09 DIAGNOSIS — Z23 Encounter for immunization: Secondary | ICD-10-CM | POA: Diagnosis not present

## 2017-10-09 DIAGNOSIS — F17210 Nicotine dependence, cigarettes, uncomplicated: Secondary | ICD-10-CM | POA: Diagnosis not present

## 2017-10-09 DIAGNOSIS — E785 Hyperlipidemia, unspecified: Secondary | ICD-10-CM | POA: Diagnosis not present

## 2017-10-09 DIAGNOSIS — F411 Generalized anxiety disorder: Secondary | ICD-10-CM | POA: Diagnosis not present

## 2017-10-09 DIAGNOSIS — Z6821 Body mass index (BMI) 21.0-21.9, adult: Secondary | ICD-10-CM | POA: Diagnosis not present

## 2017-10-17 DIAGNOSIS — E782 Mixed hyperlipidemia: Secondary | ICD-10-CM | POA: Diagnosis not present

## 2017-10-23 DIAGNOSIS — E039 Hypothyroidism, unspecified: Secondary | ICD-10-CM | POA: Diagnosis not present

## 2017-10-23 DIAGNOSIS — F419 Anxiety disorder, unspecified: Secondary | ICD-10-CM | POA: Diagnosis not present

## 2017-10-23 DIAGNOSIS — F17210 Nicotine dependence, cigarettes, uncomplicated: Secondary | ICD-10-CM | POA: Diagnosis not present

## 2017-10-23 DIAGNOSIS — E785 Hyperlipidemia, unspecified: Secondary | ICD-10-CM | POA: Diagnosis not present

## 2017-10-23 DIAGNOSIS — J449 Chronic obstructive pulmonary disease, unspecified: Secondary | ICD-10-CM | POA: Diagnosis not present

## 2017-10-23 DIAGNOSIS — R634 Abnormal weight loss: Secondary | ICD-10-CM | POA: Diagnosis not present

## 2017-10-23 DIAGNOSIS — Z6821 Body mass index (BMI) 21.0-21.9, adult: Secondary | ICD-10-CM | POA: Diagnosis not present

## 2017-10-23 DIAGNOSIS — F339 Major depressive disorder, recurrent, unspecified: Secondary | ICD-10-CM | POA: Diagnosis not present

## 2017-10-23 DIAGNOSIS — G3184 Mild cognitive impairment, so stated: Secondary | ICD-10-CM | POA: Diagnosis not present

## 2017-10-23 DIAGNOSIS — Z0000 Encounter for general adult medical examination without abnormal findings: Secondary | ICD-10-CM | POA: Diagnosis not present

## 2017-11-21 ENCOUNTER — Ambulatory Visit: Payer: Medicare Other | Admitting: "Endocrinology

## 2017-12-02 DIAGNOSIS — F17210 Nicotine dependence, cigarettes, uncomplicated: Secondary | ICD-10-CM | POA: Diagnosis not present

## 2017-12-02 DIAGNOSIS — G3184 Mild cognitive impairment, so stated: Secondary | ICD-10-CM | POA: Diagnosis not present

## 2017-12-02 DIAGNOSIS — Z6821 Body mass index (BMI) 21.0-21.9, adult: Secondary | ICD-10-CM | POA: Diagnosis not present

## 2017-12-02 DIAGNOSIS — J449 Chronic obstructive pulmonary disease, unspecified: Secondary | ICD-10-CM | POA: Diagnosis not present

## 2017-12-26 DIAGNOSIS — H2512 Age-related nuclear cataract, left eye: Secondary | ICD-10-CM | POA: Diagnosis not present

## 2017-12-30 DIAGNOSIS — M79675 Pain in left toe(s): Secondary | ICD-10-CM | POA: Diagnosis not present

## 2017-12-30 DIAGNOSIS — I739 Peripheral vascular disease, unspecified: Secondary | ICD-10-CM | POA: Diagnosis not present

## 2017-12-30 DIAGNOSIS — M79674 Pain in right toe(s): Secondary | ICD-10-CM | POA: Diagnosis not present

## 2017-12-30 DIAGNOSIS — B351 Tinea unguium: Secondary | ICD-10-CM | POA: Diagnosis not present

## 2018-01-02 DIAGNOSIS — G3184 Mild cognitive impairment, so stated: Secondary | ICD-10-CM | POA: Diagnosis not present

## 2018-01-02 DIAGNOSIS — J449 Chronic obstructive pulmonary disease, unspecified: Secondary | ICD-10-CM | POA: Diagnosis not present

## 2018-02-12 ENCOUNTER — Other Ambulatory Visit: Payer: Self-pay

## 2018-02-12 NOTE — Patient Outreach (Signed)
[facility]) Care Management  02/12/2018  TIFFANI KADOW 1946/07/25 [id number]   TELEPHONE SCREENING Referral date: 02/11/18 Referral source: primary MD  Referral reason: medical, social work assessment and environmental safety Insurance: Medicare  Telephone call to patient regarding primary MD referral. HIPAA verified with patient. Discussed and offered [facility] care management services to patient.  Patient refused services. Patient states, " I am doing fine right now and don't feel I need the services." patient verbally agreed to have [facility] care management brochure mailed to her.   RNCM called patients primary MD and left message for Caryl Pina regarding patients refusal of services.   PLAN: RNCM will refer patient to care management assistant to close due to patient refusing services.  RNCM will send notification to patients primary MD of closure.  RNCM will send patient [facility] care management brochure as discussed.   Quinn Plowman RN,BSN,CCM [facility] Telephonic  [phone]

## 2018-02-13 DIAGNOSIS — H2512 Age-related nuclear cataract, left eye: Secondary | ICD-10-CM | POA: Diagnosis not present

## 2018-02-17 DIAGNOSIS — J449 Chronic obstructive pulmonary disease, unspecified: Secondary | ICD-10-CM | POA: Diagnosis not present

## 2018-02-17 DIAGNOSIS — G3184 Mild cognitive impairment, so stated: Secondary | ICD-10-CM | POA: Diagnosis not present

## 2018-02-20 NOTE — Discharge Instructions (Signed)

## 2018-02-26 ENCOUNTER — Ambulatory Visit: Payer: Medicare Other | Admitting: Anesthesiology

## 2018-02-26 ENCOUNTER — Encounter
Admission: RE | Disposition: A | Discharge status: Home / Self Care | Payer: Self-pay | Source: Ambulatory Visit | Attending: Ophthalmology

## 2018-02-26 ENCOUNTER — Ambulatory Visit
Admission: RE | Admit: 2018-02-26 | Discharge: 2018-02-26 | Disposition: A | Discharge status: Home / Self Care | Payer: Medicare Other | Source: Ambulatory Visit | Attending: Ophthalmology | Admitting: Ophthalmology

## 2018-02-26 DIAGNOSIS — H25812 Combined forms of age-related cataract, left eye: Secondary | ICD-10-CM | POA: Diagnosis not present

## 2018-02-26 DIAGNOSIS — J449 Chronic obstructive pulmonary disease, unspecified: Secondary | ICD-10-CM | POA: Insufficient documentation

## 2018-02-26 DIAGNOSIS — F17210 Nicotine dependence, cigarettes, uncomplicated: Secondary | ICD-10-CM | POA: Diagnosis not present

## 2018-02-26 DIAGNOSIS — H2512 Age-related nuclear cataract, left eye: Secondary | ICD-10-CM | POA: Diagnosis not present

## 2018-02-26 HISTORY — PX: CATARACT EXTRACTION W/PHACO: SHX586

## 2018-02-26 HISTORY — DX: Chronic obstructive pulmonary disease, unspecified: J44.9

## 2018-02-26 HISTORY — DX: Dyspnea, unspecified: R06.00

## 2018-02-26 HISTORY — DX: Other amnesia: R41.3

## 2018-02-26 HISTORY — DX: Pure hypercholesterolemia, unspecified: E78.00

## 2018-02-26 HISTORY — DX: Major depressive disorder, single episode, unspecified: F32.9

## 2018-02-26 HISTORY — DX: Depression, unspecified: F32.A

## 2018-02-26 SURGERY — PHACOEMULSIFICATION, CATARACT, WITH IOL INSERTION
Anesthesia: Monitor Anesthesia Care | Site: Eye | Laterality: Left | Wound class: "Clean "

## 2018-02-26 MED ORDER — ACETAMINOPHEN 160 MG/5ML PO SOLN: 325.0000 mg | ORAL | Status: DC | PRN

## 2018-02-26 MED ORDER — FENTANYL CITRATE (PF) 100 MCG/2ML IJ SOLN
INTRAMUSCULAR | Status: DC | PRN
  Administered 2018-02-26: 50 ug via INTRAVENOUS

## 2018-02-26 MED ORDER — CEFUROXIME OPHTHALMIC INJECTION 1 MG/0.1 ML
INJECTION | OPHTHALMIC | Status: DC | PRN
  Administered 2018-02-26: 0.1 mL via OPHTHALMIC

## 2018-02-26 MED ORDER — ACETAMINOPHEN 325 MG PO TABS: 650.0000 mg | ORAL_TABLET | Freq: Once | ORAL | Status: DC | PRN

## 2018-02-26 MED ORDER — ONDANSETRON HCL 4 MG/2ML IJ SOLN: 4.0000 mg | Freq: Once | INTRAMUSCULAR | Status: DC | PRN

## 2018-02-26 MED ORDER — BRIMONIDINE TARTRATE-TIMOLOL 0.2-0.5 % OP SOLN
OPHTHALMIC | Status: DC | PRN
  Administered 2018-02-26: 1 [drp] via OPHTHALMIC

## 2018-02-26 MED ORDER — EPINEPHRINE PF 1 MG/ML IJ SOLN
INTRAOCULAR | Status: DC | PRN
  Administered 2018-02-26: 58 mL via OPHTHALMIC

## 2018-02-26 MED ORDER — MIDAZOLAM HCL 2 MG/2ML IJ SOLN
INTRAMUSCULAR | Status: DC | PRN
  Administered 2018-02-26: 1.5 mg via INTRAVENOUS

## 2018-02-26 MED ORDER — LIDOCAINE HCL (PF) 2 % IJ SOLN
INTRAOCULAR | Status: DC | PRN
  Administered 2018-02-26: 1 mL

## 2018-02-26 MED ORDER — MOXIFLOXACIN HCL 0.5 % OP SOLN
1.0000 [drp] | OPHTHALMIC | Status: DC | PRN
  Administered 2018-02-26 (×3): 1 [drp] via OPHTHALMIC

## 2018-02-26 MED ORDER — LACTATED RINGERS IV SOLN: INTRAVENOUS | Status: DC

## 2018-02-26 MED ORDER — NA HYALUR & NA CHOND-NA HYALUR 0.4-0.35 ML IO KIT
PACK | INTRAOCULAR | Status: DC | PRN
  Administered 2018-02-26: 1 mL via INTRAOCULAR

## 2018-02-26 MED ORDER — ARMC OPHTHALMIC DILATING DROPS
1.0000 "application " | OPHTHALMIC | Status: DC | PRN
  Administered 2018-02-26 (×3): 1 via OPHTHALMIC

## 2018-02-26 SURGICAL SUPPLY — 27 items

## 2018-02-26 NOTE — Op Note (Signed)
OPERATIVE NOTE  YAMELI DELAMATER [id number] 02/26/2018   PREOPERATIVE DIAGNOSIS:  Nuclear sclerotic cataract left eye. H25.12   POSTOPERATIVE DIAGNOSIS:    Nuclear sclerotic cataract left eye.     PROCEDURE:  Phacoemusification with posterior chamber intraocular lens placement of the left eye   LENS:   Implant Name Type Inv. Item Serial No. Manufacturer Lot No. LRB No. Used  LENS IOL DIOP 23.0 - [id number] Intraocular Lens LENS IOL DIOP 23.0 [id number] AMO  Left 1        ULTRASOUND TIME: 18  % of 1 minutes 25 seconds, CDE 15.6  SURGEON:  Wyonia Hough, MD   ANESTHESIA:  Topical with tetracaine drops and 2% Xylocaine jelly, augmented with 1% preservative-free intracameral lidocaine.    COMPLICATIONS:  None.   DESCRIPTION OF PROCEDURE:  The patient was identified in the holding room and transported to the operating room and placed in the supine position under the operating microscope.  The left eye was identified as the operative eye and it was prepped and draped in the usual sterile ophthalmic fashion.   A 1 millimeter clear-corneal paracentesis was made at the 1:30 position.  0.5 ml of preservative-free 1% lidocaine was injected into the anterior chamber.  The anterior chamber was filled with Viscoat viscoelastic.  A 2.4 millimeter keratome was used to make a near-clear corneal incision at the 10:30 position.  .  A curvilinear capsulorrhexis was made with a cystotome and capsulorrhexis forceps.  Balanced salt solution was used to hydrodissect and hydrodelineate the nucleus.   Phacoemulsification was then used in stop and chop fashion to remove the lens nucleus and epinucleus.  The remaining cortex was then removed using the irrigation and aspiration handpiece. Provisc was then placed into the capsular bag to distend it for lens placement.  A lens was then injected into the capsular bag.  The remaining viscoelastic was aspirated.   Wounds were hydrated with balanced salt  solution.  The anterior chamber was inflated to a physiologic pressure with balanced salt solution.  No wound leaks were noted. Cefuroxime 0.1 ml of a 10mg /ml solution was injected into the anterior chamber for a dose of 1 mg of intracameral antibiotic at the completion of the case.   Timolol and Brimonidine drops were applied to the eye.  The patient was taken to the recovery room in stable condition without complications of anesthesia or surgery.  Tiesha Marich 02/26/2018, 11:42 AM

## 2018-02-26 NOTE — Transfer of Care (Signed)
Immediate Anesthesia Transfer of Care Note  Patient: Laura Foster  Procedure(s) Performed: CATARACT EXTRACTION PHACO AND INTRAOCULAR LENS PLACEMENT (IOC) LEFT (Left Eye)  Patient Location: PACU  Anesthesia Type: MAC  Level of Consciousness: awake, alert  and patient cooperative  Airway and Oxygen Therapy: Patient Spontanous Breathing and Patient connected to supplemental oxygen  Post-op Assessment: Post-op Vital signs reviewed, Patient's Cardiovascular Status Stable, Respiratory Function Stable, Patent Airway and No signs of Nausea or vomiting  Post-op Vital Signs: Reviewed and stable  Complications: No apparent anesthesia complications

## 2018-02-26 NOTE — Anesthesia Postprocedure Evaluation (Signed)
Anesthesia Post Note  Patient: Laura Foster  Procedure(s) Performed: CATARACT EXTRACTION PHACO AND INTRAOCULAR LENS PLACEMENT (IOC) LEFT (Left Eye)  Patient location during evaluation: PACU Anesthesia Type: MAC Level of consciousness: awake and alert, oriented and patient cooperative Pain management: pain level controlled Vital Signs Assessment: post-procedure vital signs reviewed and stable Respiratory status: spontaneous breathing, nonlabored ventilation and respiratory function stable Cardiovascular status: blood pressure returned to baseline and stable Postop Assessment: adequate PO intake Anesthetic complications: no    Darrin Nipper

## 2018-02-26 NOTE — H&P (Signed)
The History and Physical notes are on paper, have been signed, and are to be scanned. The patient remains stable and unchanged from the H&P.   Previous H&P reviewed, patient examined, and there are no changes.  Laura Foster 02/26/2018 10:28 AM

## 2018-02-26 NOTE — Anesthesia Procedure Notes (Signed)
Procedure Name: MAC Performed by: Ophie Burrowes, CRNA Pre-anesthesia Checklist: Patient identified, Emergency Drugs available, Suction available, Timeout performed and Patient being monitored Patient Re-evaluated:Patient Re-evaluated prior to induction Oxygen Delivery Method: Nasal cannula Placement Confirmation: positive ETCO2       

## 2018-02-26 NOTE — Anesthesia Preprocedure Evaluation (Signed)
Anesthesia Evaluation  Patient identified by MRN, date of birth, ID band Patient awake    Reviewed: Allergy & Precautions, NPO status , Patient's Chart, lab work & pertinent test results  History of Anesthesia Complications Negative for: history of anesthetic complications  Airway Mallampati: III  TM Distance: >3 FB Neck ROM: Full    Dental  (+) Lower Dentures, Upper Dentures   Pulmonary COPD, Current Smoker (trying to quit; had 3 cigarettes yesterday),  Snoring    Pulmonary exam normal breath sounds clear to auscultation       Cardiovascular Exercise Tolerance: Good negative cardio ROS Normal cardiovascular exam Rhythm:Regular Rate:Normal     Neuro/Psych PSYCHIATRIC DISORDERS Depression negative neurological ROS     GI/Hepatic negative GI ROS,   Endo/Other  Hypothyroidism   Renal/GU Renal InsufficiencyRenal disease     Musculoskeletal   Abdominal   Peds  Hematology negative hematology ROS (+)   Anesthesia Other Findings   Reproductive/Obstetrics                             Anesthesia Physical Anesthesia Plan  ASA: III  Anesthesia Plan: MAC   Post-op Pain Management:    Induction: Intravenous  PONV Risk Score and Plan: 1 and TIVA and Midazolam  Airway Management Planned: Natural Airway  Additional Equipment:   Intra-op Plan:   Post-operative Plan:   Informed Consent: I have reviewed the patients History and Physical, chart, labs and discussed the procedure including the risks, benefits and alternatives for the proposed anesthesia with the patient or authorized representative who has indicated his/her understanding and acceptance.     Plan Discussed with: CRNA  Anesthesia Plan Comments:         Anesthesia Quick Evaluation

## 2018-02-27 ENCOUNTER — Encounter: Payer: Self-pay | Admitting: Ophthalmology

## 2018-03-06 ENCOUNTER — Ambulatory Visit: Payer: Medicare Other | Admitting: Neurology

## 2018-04-09 DIAGNOSIS — I10 Essential (primary) hypertension: Secondary | ICD-10-CM | POA: Diagnosis not present

## 2018-04-09 DIAGNOSIS — E782 Mixed hyperlipidemia: Secondary | ICD-10-CM | POA: Diagnosis not present

## 2018-04-14 DIAGNOSIS — F17201 Nicotine dependence, unspecified, in remission: Secondary | ICD-10-CM | POA: Diagnosis not present

## 2018-04-14 DIAGNOSIS — G3184 Mild cognitive impairment, so stated: Secondary | ICD-10-CM | POA: Diagnosis not present

## 2018-04-14 DIAGNOSIS — E039 Hypothyroidism, unspecified: Secondary | ICD-10-CM | POA: Diagnosis not present

## 2018-04-14 DIAGNOSIS — Z6822 Body mass index (BMI) 22.0-22.9, adult: Secondary | ICD-10-CM | POA: Diagnosis not present

## 2018-04-14 DIAGNOSIS — J449 Chronic obstructive pulmonary disease, unspecified: Secondary | ICD-10-CM | POA: Diagnosis not present

## 2018-04-24 ENCOUNTER — Other Ambulatory Visit: Payer: Self-pay

## 2018-04-24 ENCOUNTER — Ambulatory Visit (INDEPENDENT_AMBULATORY_CARE_PROVIDER_SITE_OTHER): Payer: Medicare Other | Admitting: Neurology

## 2018-04-24 ENCOUNTER — Telehealth: Payer: Self-pay | Admitting: Neurology

## 2018-04-24 ENCOUNTER — Encounter: Payer: Self-pay | Admitting: Neurology

## 2018-04-24 VITALS — BP 98/67 | HR 84 | Ht 63.0 in

## 2018-04-24 DIAGNOSIS — R413 Other amnesia: Secondary | ICD-10-CM | POA: Insufficient documentation

## 2018-04-24 DIAGNOSIS — E538 Deficiency of other specified B group vitamins: Secondary | ICD-10-CM | POA: Diagnosis not present

## 2018-04-24 HISTORY — DX: Other amnesia: R41.3

## 2018-04-24 MED ORDER — DONEPEZIL HCL 10 MG PO TABS: 10.0000 mg | ORAL_TABLET | Freq: Every day | ORAL | 3 refills | Status: DC

## 2018-04-24 NOTE — Telephone Encounter (Signed)
Medicare/aarp order sent to GI. No auth they will reach the pt to schedule.
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
## 2018-04-24 NOTE — Patient Instructions (Addendum)
   We will go up on the Aricept to 10 mg at night.  Begin Aricept (donepezil) at 5 mg at night for one month. If this medication is well-tolerated, please call our office and we will call in a prescription for the 10 mg tablets. Look out for side effects that may include nausea, diarrhea, weight loss, or stomach cramps. This medication will also cause a runny nose, therefore there is no need for allergy medications for this purpose.

## 2018-04-25 LAB — VITAMIN B12: VITAMIN B 12: 290 pg/mL (ref 232–1245)

## 2018-04-25 LAB — SEDIMENTATION RATE: Sed Rate: 3 mm/hr (ref 0–40)

## 2018-04-25 LAB — RPR: RPR: NONREACTIVE

## 2018-04-29 ENCOUNTER — Telehealth: Payer: Self-pay | Admitting: *Deleted

## 2018-04-29 NOTE — Telephone Encounter (Signed)
-----   Message from Kathrynn Ducking, MD sent at 04/25/2018  2:58 PM EDT -----  The blood work results are unremarkable. Please call the patient.  ----- Message ----- From: Lavone Neri Lab Results In Sent: 04/25/2018   5:38 AM To: Kathrynn Ducking, MD

## 2018-04-29 NOTE — Telephone Encounter (Signed)
Called and spoke w/ pt daughter, [name] (on [place]) about unremarkable labs per CW,MD. She verbalized understanding. She asked when they would hear about scheduling MRI.  I checked and order was sent to [facility]. Gave her phone to their office to call and schedule: [phone].

## 2018-05-07 ENCOUNTER — Ambulatory Visit
Admission: RE | Admit: 2018-05-07 | Discharge: 2018-05-07 | Disposition: A | Discharge status: Home / Self Care | Payer: Medicare Other | Source: Ambulatory Visit | Attending: Neurology | Admitting: Neurology

## 2018-05-07 DIAGNOSIS — R413 Other amnesia: Secondary | ICD-10-CM | POA: Diagnosis not present

## 2018-05-08 ENCOUNTER — Telehealth: Payer: Self-pay | Admitting: Neurology

## 2018-05-08 NOTE — Telephone Encounter (Signed)
I called the patient.  MRI of the brain shows minimal white matter changes.  A single area of hemosiderin deposition was noted in the left parieto-occipital area, significance of this is not clear.    MRI brain 05/08/18:  IMPRESSION:   MRI brain (without) demonstrating: 1. Small focus of hemosiderin noted in the left parietal-occipital sulci. Could be related to amyloid angiopathy, remote subarachnoid hemorrhage, or other causes of mineral deposition.  2. Mild chronic small vessel ischemic disease.  3. No acute findings.

## 2018-06-09 ENCOUNTER — Telehealth: Payer: Self-pay | Admitting: Neurology

## 2018-06-09 NOTE — Telephone Encounter (Signed)
Pt's daughter [name] [facility]) request MRI results. She can be reached at [phone].

## 2018-06-09 NOTE — Telephone Encounter (Signed)
I spoke with her daughter to discuss the MRI of the brain.  It shows age-related mild generalized cortical atrophy (not significantly worse in the mesial temporal lobes) and age-related chronic microvascular ischemic changes with a small chronic hemorrhage in the left occipital lobe.  As there is only one hemosiderin focus, cerebral amyloid angiopathy is less likely.

## 2018-06-09 NOTE — Telephone Encounter (Signed)
Dr. Felecia Shelling- can you call daughter back to discuss MRI results. I am not comfortable with this per Dr. Jannifer Franklin note

## 2018-10-13 DIAGNOSIS — E039 Hypothyroidism, unspecified: Secondary | ICD-10-CM | POA: Diagnosis not present

## 2018-10-13 DIAGNOSIS — Z6822 Body mass index (BMI) 22.0-22.9, adult: Secondary | ICD-10-CM | POA: Diagnosis not present

## 2018-10-13 DIAGNOSIS — J449 Chronic obstructive pulmonary disease, unspecified: Secondary | ICD-10-CM | POA: Diagnosis not present

## 2018-10-13 DIAGNOSIS — G3184 Mild cognitive impairment, so stated: Secondary | ICD-10-CM | POA: Diagnosis not present

## 2018-10-17 DIAGNOSIS — F411 Generalized anxiety disorder: Secondary | ICD-10-CM | POA: Diagnosis not present

## 2018-10-17 DIAGNOSIS — J449 Chronic obstructive pulmonary disease, unspecified: Secondary | ICD-10-CM | POA: Diagnosis not present

## 2018-10-17 DIAGNOSIS — F17200 Nicotine dependence, unspecified, uncomplicated: Secondary | ICD-10-CM | POA: Diagnosis not present

## 2018-10-17 DIAGNOSIS — G3184 Mild cognitive impairment, so stated: Secondary | ICD-10-CM | POA: Diagnosis not present

## 2018-10-17 DIAGNOSIS — E782 Mixed hyperlipidemia: Secondary | ICD-10-CM | POA: Diagnosis not present

## 2018-10-17 DIAGNOSIS — E039 Hypothyroidism, unspecified: Secondary | ICD-10-CM | POA: Diagnosis not present

## 2018-10-17 DIAGNOSIS — Z0000 Encounter for general adult medical examination without abnormal findings: Secondary | ICD-10-CM | POA: Diagnosis not present

## 2018-10-17 DIAGNOSIS — Z23 Encounter for immunization: Secondary | ICD-10-CM | POA: Diagnosis not present

## 2018-10-17 DIAGNOSIS — F331 Major depressive disorder, recurrent, moderate: Secondary | ICD-10-CM | POA: Diagnosis not present

## 2018-10-27 ENCOUNTER — Encounter: Payer: Self-pay | Admitting: Adult Health

## 2018-10-27 ENCOUNTER — Ambulatory Visit (INDEPENDENT_AMBULATORY_CARE_PROVIDER_SITE_OTHER): Payer: Medicare Other | Admitting: Adult Health

## 2018-10-27 VITALS — BP 102/59 | HR 86 | Ht 63.0 in | Wt 139.6 lb

## 2018-10-27 DIAGNOSIS — R413 Other amnesia: Secondary | ICD-10-CM | POA: Diagnosis not present

## 2018-10-27 MED ORDER — MEMANTINE HCL 5 MG PO TABS: 5.0000 mg | ORAL_TABLET | Freq: Two times a day (BID) | ORAL | 11 refills | Status: DC

## 2018-10-27 NOTE — Progress Notes (Signed)
I have read the note, and I agree with the clinical assessment and plan.  Charles K Willis   
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
## 2018-10-27 NOTE — Patient Instructions (Signed)
Your Plan:  Continue Aricept  Start Namenda 5 mg twice a day Memory score is stable If your symptoms worsen or you develop new symptoms please let us know.   Thank you for coming to see [place] at [facility]. I hope we have been able to provide you high quality care today.  You may receive a patient satisfaction survey over the next few weeks. We would appreciate your feedback and comments so that we may continue to improve ourselves and the health of our patients.  Memantine Tablets What is this medicine? MEMANTINE (MEM an teen) is used to treat dementia caused by Alzheimer's disease. This medicine may be used for other purposes; ask your health care provider or pharmacist if you have questions. COMMON BRAND NAME(S): Namenda What should I tell my health care provider before I take this medicine? They need to know if you have any of these conditions: -difficulty passing urine -kidney disease -liver disease -seizures -an unusual or allergic reaction to memantine, other medicines, foods, dyes, or preservatives -pregnant or trying to get pregnant -breast-feeding How should I use this medicine? Take this medicine by mouth with a glass of water. Follow the directions on the prescription label. You may take this medicine with or without food. Take your doses at regular intervals. Do not take your medicine more often than directed. Continue to take your medicine even if you feel better. Do not stop taking except on the advice of your doctor or health care professional. Talk to your pediatrician regarding the use of this medicine in children. Special care may be needed. Overdosage: If you think you have taken too much of this medicine contact a poison control center or emergency room at once. NOTE: This medicine is only for you. Do not share this medicine with others. What if I miss a dose? If you miss a dose, take it as soon as you can. If it is almost time for your next dose,  take only that dose. Do not take double or extra doses. If you do not take your medicine for several days, contact your health care provider. Your dose may need to be changed. What may interact with this medicine? -acetazolamide -amantadine -cimetidine -dextromethorphan -dofetilide -hydrochlorothiazide -ketamine -metformin -methazolamide -quinidine -ranitidine -sodium bicarbonate -triamterene This list may not describe all possible interactions. Give your health care provider a list of all the medicines, herbs, non-prescription drugs, or dietary supplements you use. Also tell them if you smoke, drink alcohol, or use illegal drugs. Some items may interact with your medicine. What should I watch for while using this medicine? Visit your doctor or health care professional for regular checks on your progress. Check with your doctor or health care professional if there is no improvement in your symptoms or if they get worse. You may get drowsy or dizzy. Do not drive, use machinery, or do anything that needs mental alertness until you know how this drug affects you. Do not stand or sit up quickly, especially if you are an older patient. This reduces the risk of dizzy or fainting spells. Alcohol can make you more drowsy and dizzy. Avoid alcoholic drinks. What side effects may I notice from receiving this medicine? Side effects that you should report to your doctor or health care professional as soon as possible: -allergic reactions like skin rash, itching or hives, swelling of the face, lips, or tongue -agitation or a feeling of restlessness -depressed mood -dizziness -hallucinations -redness, blistering, peeling or loosening of the skin,  including inside the mouth -seizures -vomiting Side effects that usually do not require medical attention (report to your doctor or health care professional if they continue or are bothersome): -constipation -diarrhea -headache -nausea -trouble  sleeping This list may not describe all possible side effects. Call your doctor for medical advice about side effects. You may report side effects to FDA at 1-800-FDA-1088. Where should I keep my medicine? Keep out of the reach of children. Store at room temperature between 15 degrees and 30 degrees C (59 degrees and 86 degrees F). Throw away any unused medicine after the expiration date. NOTE: This sheet is a summary. It may not cover all possible information. If you have questions about this medicine, talk to your doctor, pharmacist, or health care provider.  2018 Elsevier/Gold Standard (2013-09-07 14:10:42)

## 2019-04-16 DIAGNOSIS — Z0000 Encounter for general adult medical examination without abnormal findings: Secondary | ICD-10-CM | POA: Diagnosis not present

## 2019-04-20 DIAGNOSIS — E039 Hypothyroidism, unspecified: Secondary | ICD-10-CM | POA: Diagnosis not present

## 2019-04-20 DIAGNOSIS — E782 Mixed hyperlipidemia: Secondary | ICD-10-CM | POA: Diagnosis not present

## 2019-04-22 ENCOUNTER — Other Ambulatory Visit: Payer: Self-pay | Admitting: Neurology

## 2019-04-29 ENCOUNTER — Other Ambulatory Visit: Payer: Self-pay | Admitting: Internal Medicine

## 2019-04-29 DIAGNOSIS — E039 Hypothyroidism, unspecified: Secondary | ICD-10-CM | POA: Diagnosis not present

## 2019-04-29 DIAGNOSIS — Z0000 Encounter for general adult medical examination without abnormal findings: Secondary | ICD-10-CM | POA: Diagnosis not present

## 2019-04-29 DIAGNOSIS — G3184 Mild cognitive impairment, so stated: Secondary | ICD-10-CM | POA: Diagnosis not present

## 2019-04-29 DIAGNOSIS — F331 Major depressive disorder, recurrent, moderate: Secondary | ICD-10-CM | POA: Diagnosis not present

## 2019-04-29 DIAGNOSIS — Z780 Asymptomatic menopausal state: Secondary | ICD-10-CM

## 2019-04-29 DIAGNOSIS — J449 Chronic obstructive pulmonary disease, unspecified: Secondary | ICD-10-CM | POA: Diagnosis not present

## 2019-04-29 DIAGNOSIS — E782 Mixed hyperlipidemia: Secondary | ICD-10-CM | POA: Diagnosis not present

## 2019-04-29 DIAGNOSIS — F17218 Nicotine dependence, cigarettes, with other nicotine-induced disorders: Secondary | ICD-10-CM | POA: Diagnosis not present

## 2019-04-29 DIAGNOSIS — E2839 Other primary ovarian failure: Secondary | ICD-10-CM

## 2019-04-29 DIAGNOSIS — F411 Generalized anxiety disorder: Secondary | ICD-10-CM | POA: Diagnosis not present

## 2019-04-30 ENCOUNTER — Other Ambulatory Visit: Payer: Self-pay | Admitting: Neurology

## 2019-04-30 NOTE — Telephone Encounter (Signed)
Rx refilled.

## 2019-06-09 ENCOUNTER — Encounter: Payer: Self-pay | Admitting: Adult Health

## 2019-06-09 ENCOUNTER — Ambulatory Visit (INDEPENDENT_AMBULATORY_CARE_PROVIDER_SITE_OTHER): Payer: Medicare Other | Admitting: Adult Health

## 2019-06-09 ENCOUNTER — Other Ambulatory Visit: Payer: Self-pay

## 2019-06-09 VITALS — BP 112/70 | HR 68 | Temp 96.6°F | Ht 63.0 in | Wt 142.6 lb

## 2019-06-09 DIAGNOSIS — R413 Other amnesia: Secondary | ICD-10-CM | POA: Diagnosis not present

## 2019-06-09 NOTE — Patient Instructions (Signed)
Your Plan:  Continue Aricept  Memory score stable Consider help for the home If your symptoms worsen or you develop new symptoms please let us know.   Thank you for coming to see [place] at [facility]. I hope we have been able to provide you high quality care today.  You may receive a patient satisfaction survey over the next few weeks. We would appreciate your feedback and comments so that we may continue to improve ourselves and the health of our patients.

## 2019-06-09 NOTE — Progress Notes (Signed)
I have read the note, and I agree with the clinical assessment and plan.  Charles K Willis   
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
## 2019-08-11 DIAGNOSIS — E039 Hypothyroidism, unspecified: Secondary | ICD-10-CM | POA: Diagnosis not present

## 2019-08-11 DIAGNOSIS — E782 Mixed hyperlipidemia: Secondary | ICD-10-CM | POA: Diagnosis not present

## 2019-08-11 DIAGNOSIS — J449 Chronic obstructive pulmonary disease, unspecified: Secondary | ICD-10-CM | POA: Diagnosis not present

## 2019-08-11 DIAGNOSIS — G3184 Mild cognitive impairment, so stated: Secondary | ICD-10-CM | POA: Diagnosis not present

## 2019-09-22 ENCOUNTER — Other Ambulatory Visit: Payer: Self-pay | Admitting: Neurology

## 2019-10-19 ENCOUNTER — Other Ambulatory Visit: Payer: Self-pay | Admitting: Adult Health

## 2019-10-22 DIAGNOSIS — E782 Mixed hyperlipidemia: Secondary | ICD-10-CM | POA: Diagnosis not present

## 2019-10-22 DIAGNOSIS — E039 Hypothyroidism, unspecified: Secondary | ICD-10-CM | POA: Diagnosis not present

## 2019-11-11 DIAGNOSIS — E7849 Other hyperlipidemia: Secondary | ICD-10-CM | POA: Diagnosis not present

## 2019-11-11 DIAGNOSIS — J449 Chronic obstructive pulmonary disease, unspecified: Secondary | ICD-10-CM | POA: Diagnosis not present

## 2019-12-16 ENCOUNTER — Ambulatory Visit (INDEPENDENT_AMBULATORY_CARE_PROVIDER_SITE_OTHER): Payer: Medicare Other | Admitting: Adult Health

## 2019-12-16 ENCOUNTER — Other Ambulatory Visit: Payer: Self-pay

## 2019-12-16 ENCOUNTER — Encounter: Payer: Self-pay | Admitting: Adult Health

## 2019-12-16 VITALS — BP 117/74 | HR 63 | Temp 96.9°F | Ht 65.0 in | Wt 146.4 lb

## 2019-12-16 DIAGNOSIS — R413 Other amnesia: Secondary | ICD-10-CM

## 2019-12-16 MED ORDER — MEMANTINE HCL 10 MG PO TABS: 10.0000 mg | ORAL_TABLET | Freq: Two times a day (BID) | ORAL | 11 refills | Status: DC

## 2019-12-16 NOTE — Patient Instructions (Signed)
Your Plan:  Continue Aricept Increase Namenda to 10 mg twice a day If your symptoms worsen or you develop new symptoms please let us know.   Thank you for coming to see [place] at [facility]. I hope we have been able to provide you high quality care today.  You may receive a patient satisfaction survey over the next few weeks. We would appreciate your feedback and comments so that we may continue to improve ourselves and the health of our patients.
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
## 2019-12-30 DIAGNOSIS — E7849 Other hyperlipidemia: Secondary | ICD-10-CM | POA: Diagnosis not present

## 2019-12-30 DIAGNOSIS — J449 Chronic obstructive pulmonary disease, unspecified: Secondary | ICD-10-CM | POA: Diagnosis not present

## 2020-01-19 ENCOUNTER — Other Ambulatory Visit: Payer: Self-pay

## 2020-01-19 NOTE — Patient Outreach (Signed)
[facility]) Care Management  01/19/2020  XYA GENUNG [birth year] [id number]   Medication Adherence call to Mrs. [facility] patients telephone number and cell number belongs to some one else. Laura Foster is showing past due on Atorvastatin 10 mg under [name].   [place] Management Direct Dial [phone]  Fax [phone] [email or web address]

## 2020-02-05 DIAGNOSIS — G3184 Mild cognitive impairment, so stated: Secondary | ICD-10-CM | POA: Diagnosis not present

## 2020-02-05 DIAGNOSIS — J449 Chronic obstructive pulmonary disease, unspecified: Secondary | ICD-10-CM | POA: Diagnosis not present

## 2020-02-05 DIAGNOSIS — E782 Mixed hyperlipidemia: Secondary | ICD-10-CM | POA: Diagnosis not present

## 2020-02-17 ENCOUNTER — Other Ambulatory Visit: Payer: Self-pay | Admitting: Adult Health

## 2020-05-26 DIAGNOSIS — E039 Hypothyroidism, unspecified: Secondary | ICD-10-CM | POA: Diagnosis not present

## 2020-05-26 DIAGNOSIS — E782 Mixed hyperlipidemia: Secondary | ICD-10-CM | POA: Diagnosis not present

## 2020-05-26 DIAGNOSIS — E7849 Other hyperlipidemia: Secondary | ICD-10-CM | POA: Diagnosis not present

## 2020-06-13 DIAGNOSIS — E039 Hypothyroidism, unspecified: Secondary | ICD-10-CM | POA: Diagnosis not present

## 2020-06-13 DIAGNOSIS — E782 Mixed hyperlipidemia: Secondary | ICD-10-CM | POA: Diagnosis not present

## 2020-06-13 DIAGNOSIS — J449 Chronic obstructive pulmonary disease, unspecified: Secondary | ICD-10-CM | POA: Diagnosis not present

## 2020-06-13 DIAGNOSIS — G3184 Mild cognitive impairment, so stated: Secondary | ICD-10-CM | POA: Diagnosis not present

## 2020-06-20 ENCOUNTER — Other Ambulatory Visit (HOSPITAL_COMMUNITY): Payer: Self-pay | Admitting: Adult Health Nurse Practitioner

## 2020-06-20 ENCOUNTER — Other Ambulatory Visit: Payer: Self-pay

## 2020-06-20 ENCOUNTER — Ambulatory Visit (HOSPITAL_COMMUNITY)
Admission: RE | Admit: 2020-06-20 | Discharge: 2020-06-20 | Disposition: A | Discharge status: Home / Self Care | Payer: Medicare Other | Source: Ambulatory Visit | Attending: Adult Health Nurse Practitioner | Admitting: Adult Health Nurse Practitioner

## 2020-06-20 DIAGNOSIS — J439 Emphysema, unspecified: Secondary | ICD-10-CM | POA: Diagnosis not present

## 2020-06-20 DIAGNOSIS — J441 Chronic obstructive pulmonary disease with (acute) exacerbation: Secondary | ICD-10-CM | POA: Insufficient documentation

## 2020-06-21 ENCOUNTER — Ambulatory Visit: Payer: Medicare Other | Admitting: Adult Health

## 2020-06-29 DIAGNOSIS — Z720 Tobacco use: Secondary | ICD-10-CM | POA: Diagnosis not present

## 2020-06-29 DIAGNOSIS — E039 Hypothyroidism, unspecified: Secondary | ICD-10-CM | POA: Diagnosis not present

## 2020-06-29 DIAGNOSIS — E782 Mixed hyperlipidemia: Secondary | ICD-10-CM | POA: Diagnosis not present

## 2020-06-29 DIAGNOSIS — E7849 Other hyperlipidemia: Secondary | ICD-10-CM | POA: Diagnosis not present

## 2020-06-29 DIAGNOSIS — J449 Chronic obstructive pulmonary disease, unspecified: Secondary | ICD-10-CM | POA: Diagnosis not present

## 2020-07-18 DIAGNOSIS — G3184 Mild cognitive impairment, so stated: Secondary | ICD-10-CM | POA: Diagnosis not present

## 2020-07-18 DIAGNOSIS — Z720 Tobacco use: Secondary | ICD-10-CM | POA: Diagnosis not present

## 2020-07-18 DIAGNOSIS — E039 Hypothyroidism, unspecified: Secondary | ICD-10-CM | POA: Diagnosis not present

## 2020-07-18 DIAGNOSIS — J449 Chronic obstructive pulmonary disease, unspecified: Secondary | ICD-10-CM | POA: Diagnosis not present

## 2020-08-15 ENCOUNTER — Other Ambulatory Visit: Payer: Self-pay | Admitting: Adult Health

## 2020-08-24 DIAGNOSIS — E7849 Other hyperlipidemia: Secondary | ICD-10-CM | POA: Diagnosis not present

## 2020-08-24 DIAGNOSIS — E782 Mixed hyperlipidemia: Secondary | ICD-10-CM | POA: Diagnosis not present

## 2020-08-24 DIAGNOSIS — J449 Chronic obstructive pulmonary disease, unspecified: Secondary | ICD-10-CM | POA: Diagnosis not present

## 2020-08-24 DIAGNOSIS — Z720 Tobacco use: Secondary | ICD-10-CM | POA: Diagnosis not present

## 2020-08-24 DIAGNOSIS — E039 Hypothyroidism, unspecified: Secondary | ICD-10-CM | POA: Diagnosis not present

## 2020-10-05 ENCOUNTER — Other Ambulatory Visit: Payer: Self-pay

## 2020-10-05 ENCOUNTER — Ambulatory Visit (INDEPENDENT_AMBULATORY_CARE_PROVIDER_SITE_OTHER): Payer: Medicare Other | Admitting: Adult Health

## 2020-10-05 ENCOUNTER — Encounter: Payer: Self-pay | Admitting: Adult Health

## 2020-10-05 VITALS — BP 118/72 | Ht 65.0 in | Wt 154.2 lb

## 2020-10-05 DIAGNOSIS — R413 Other amnesia: Secondary | ICD-10-CM

## 2020-10-05 NOTE — Patient Instructions (Signed)
Your Plan:  Continue aricept and namenda Memory score is stable If your symptoms worsen or you develop new symptoms please let us know.   Thank you for coming to see [place] at [facility]. I hope we have been able to provide you high quality care today.  You may receive a patient satisfaction survey over the next few weeks. We would appreciate your feedback and comments so that we may continue to improve ourselves and the health of our patients.
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
## 2020-12-12 ENCOUNTER — Other Ambulatory Visit: Payer: Self-pay | Admitting: Adult Health

## 2021-01-11 DIAGNOSIS — F17201 Nicotine dependence, unspecified, in remission: Secondary | ICD-10-CM | POA: Diagnosis not present

## 2021-01-11 DIAGNOSIS — F17218 Nicotine dependence, cigarettes, with other nicotine-induced disorders: Secondary | ICD-10-CM | POA: Diagnosis not present

## 2021-01-11 DIAGNOSIS — E039 Hypothyroidism, unspecified: Secondary | ICD-10-CM | POA: Diagnosis not present

## 2021-01-11 DIAGNOSIS — Z720 Tobacco use: Secondary | ICD-10-CM | POA: Diagnosis not present

## 2021-01-11 DIAGNOSIS — Z6822 Body mass index (BMI) 22.0-22.9, adult: Secondary | ICD-10-CM | POA: Diagnosis not present

## 2021-01-16 DIAGNOSIS — J449 Chronic obstructive pulmonary disease, unspecified: Secondary | ICD-10-CM | POA: Diagnosis not present

## 2021-01-16 DIAGNOSIS — E782 Mixed hyperlipidemia: Secondary | ICD-10-CM | POA: Diagnosis not present

## 2021-01-16 DIAGNOSIS — E039 Hypothyroidism, unspecified: Secondary | ICD-10-CM | POA: Diagnosis not present

## 2021-01-16 DIAGNOSIS — F17218 Nicotine dependence, cigarettes, with other nicotine-induced disorders: Secondary | ICD-10-CM | POA: Diagnosis not present

## 2021-01-16 DIAGNOSIS — R7301 Impaired fasting glucose: Secondary | ICD-10-CM | POA: Diagnosis not present

## 2021-01-16 DIAGNOSIS — G3184 Mild cognitive impairment, so stated: Secondary | ICD-10-CM | POA: Diagnosis not present

## 2021-01-30 DIAGNOSIS — E782 Mixed hyperlipidemia: Secondary | ICD-10-CM | POA: Diagnosis not present

## 2021-01-30 DIAGNOSIS — E039 Hypothyroidism, unspecified: Secondary | ICD-10-CM | POA: Diagnosis not present

## 2021-01-30 DIAGNOSIS — J449 Chronic obstructive pulmonary disease, unspecified: Secondary | ICD-10-CM | POA: Diagnosis not present

## 2021-01-30 DIAGNOSIS — G3184 Mild cognitive impairment, so stated: Secondary | ICD-10-CM | POA: Diagnosis not present

## 2021-01-30 DIAGNOSIS — F17200 Nicotine dependence, unspecified, uncomplicated: Secondary | ICD-10-CM | POA: Diagnosis not present

## 2021-03-01 DIAGNOSIS — E782 Mixed hyperlipidemia: Secondary | ICD-10-CM | POA: Diagnosis not present

## 2021-03-01 DIAGNOSIS — I10 Essential (primary) hypertension: Secondary | ICD-10-CM | POA: Diagnosis not present

## 2021-03-01 DIAGNOSIS — G3184 Mild cognitive impairment, so stated: Secondary | ICD-10-CM | POA: Diagnosis not present

## 2021-03-01 DIAGNOSIS — E039 Hypothyroidism, unspecified: Secondary | ICD-10-CM | POA: Diagnosis not present

## 2021-03-01 DIAGNOSIS — F17200 Nicotine dependence, unspecified, uncomplicated: Secondary | ICD-10-CM | POA: Diagnosis not present

## 2021-04-02 DIAGNOSIS — I10 Essential (primary) hypertension: Secondary | ICD-10-CM | POA: Diagnosis not present

## 2021-04-02 DIAGNOSIS — E1165 Type 2 diabetes mellitus with hyperglycemia: Secondary | ICD-10-CM | POA: Diagnosis not present

## 2021-04-12 ENCOUNTER — Ambulatory Visit: Payer: Medicare Other | Admitting: Adult Health

## 2021-04-27 ENCOUNTER — Ambulatory Visit: Payer: Medicare Other | Admitting: Adult Health

## 2021-05-15 ENCOUNTER — Inpatient Hospital Stay (HOSPITAL_COMMUNITY)
Admission: EM | Admit: 2021-05-15 | Discharge: 2021-05-18 | DRG: 480 | Disposition: A | Discharge status: Skilled Nursing Facility | Payer: Medicare Other | Attending: Internal Medicine | Admitting: Internal Medicine

## 2021-05-15 ENCOUNTER — Other Ambulatory Visit: Payer: Self-pay

## 2021-05-15 ENCOUNTER — Encounter (HOSPITAL_COMMUNITY)
Admission: EM | Disposition: A | Discharge status: Skilled Nursing Facility | Payer: Self-pay | Source: Home / Self Care | Attending: Internal Medicine

## 2021-05-15 ENCOUNTER — Emergency Department (HOSPITAL_COMMUNITY): Payer: Medicare Other

## 2021-05-15 ENCOUNTER — Encounter (HOSPITAL_COMMUNITY): Payer: Self-pay

## 2021-05-15 DIAGNOSIS — E039 Hypothyroidism, unspecified: Secondary | ICD-10-CM | POA: Diagnosis present

## 2021-05-15 DIAGNOSIS — W19XXXA Unspecified fall, initial encounter: Secondary | ICD-10-CM | POA: Diagnosis not present

## 2021-05-15 DIAGNOSIS — Z20822 Contact with and (suspected) exposure to covid-19: Secondary | ICD-10-CM | POA: Diagnosis present

## 2021-05-15 DIAGNOSIS — S72141A Displaced intertrochanteric fracture of right femur, initial encounter for closed fracture: Secondary | ICD-10-CM | POA: Diagnosis not present

## 2021-05-15 DIAGNOSIS — Z9889 Other specified postprocedural states: Secondary | ICD-10-CM

## 2021-05-15 DIAGNOSIS — Z880 Allergy status to penicillin: Secondary | ICD-10-CM

## 2021-05-15 DIAGNOSIS — M25551 Pain in right hip: Secondary | ICD-10-CM | POA: Diagnosis not present

## 2021-05-15 DIAGNOSIS — F32A Depression, unspecified: Secondary | ICD-10-CM | POA: Diagnosis not present

## 2021-05-15 DIAGNOSIS — J441 Chronic obstructive pulmonary disease with (acute) exacerbation: Secondary | ICD-10-CM | POA: Diagnosis not present

## 2021-05-15 DIAGNOSIS — J449 Chronic obstructive pulmonary disease, unspecified: Secondary | ICD-10-CM | POA: Diagnosis present

## 2021-05-15 DIAGNOSIS — Z23 Encounter for immunization: Secondary | ICD-10-CM | POA: Diagnosis not present

## 2021-05-15 DIAGNOSIS — Z79890 Hormone replacement therapy (postmenopausal): Secondary | ICD-10-CM | POA: Diagnosis not present

## 2021-05-15 DIAGNOSIS — S72141D Displaced intertrochanteric fracture of right femur, subsequent encounter for closed fracture with routine healing: Secondary | ICD-10-CM | POA: Diagnosis not present

## 2021-05-15 DIAGNOSIS — W1830XA Fall on same level, unspecified, initial encounter: Secondary | ICD-10-CM | POA: Diagnosis present

## 2021-05-15 DIAGNOSIS — S72001A Fracture of unspecified part of neck of right femur, initial encounter for closed fracture: Secondary | ICD-10-CM

## 2021-05-15 DIAGNOSIS — R412 Retrograde amnesia: Secondary | ICD-10-CM | POA: Diagnosis not present

## 2021-05-15 DIAGNOSIS — D72829 Elevated white blood cell count, unspecified: Secondary | ICD-10-CM | POA: Diagnosis not present

## 2021-05-15 DIAGNOSIS — J9601 Acute respiratory failure with hypoxia: Secondary | ICD-10-CM | POA: Diagnosis not present

## 2021-05-15 DIAGNOSIS — S72141G Displaced intertrochanteric fracture of right femur, subsequent encounter for closed fracture with delayed healing: Secondary | ICD-10-CM | POA: Diagnosis not present

## 2021-05-15 DIAGNOSIS — R9431 Abnormal electrocardiogram [ECG] [EKG]: Secondary | ICD-10-CM | POA: Diagnosis not present

## 2021-05-15 DIAGNOSIS — Z79899 Other long term (current) drug therapy: Secondary | ICD-10-CM

## 2021-05-15 DIAGNOSIS — E7800 Pure hypercholesterolemia, unspecified: Secondary | ICD-10-CM | POA: Diagnosis present

## 2021-05-15 DIAGNOSIS — R413 Other amnesia: Secondary | ICD-10-CM | POA: Diagnosis not present

## 2021-05-15 DIAGNOSIS — Z8249 Family history of ischemic heart disease and other diseases of the circulatory system: Secondary | ICD-10-CM | POA: Diagnosis not present

## 2021-05-15 DIAGNOSIS — R52 Pain, unspecified: Secondary | ICD-10-CM | POA: Diagnosis not present

## 2021-05-15 DIAGNOSIS — F419 Anxiety disorder, unspecified: Secondary | ICD-10-CM | POA: Diagnosis present

## 2021-05-15 DIAGNOSIS — F17210 Nicotine dependence, cigarettes, uncomplicated: Secondary | ICD-10-CM | POA: Diagnosis present

## 2021-05-15 DIAGNOSIS — M6281 Muscle weakness (generalized): Secondary | ICD-10-CM | POA: Diagnosis not present

## 2021-05-15 DIAGNOSIS — R2681 Unsteadiness on feet: Secondary | ICD-10-CM | POA: Diagnosis not present

## 2021-05-15 DIAGNOSIS — Z7951 Long term (current) use of inhaled steroids: Secondary | ICD-10-CM | POA: Diagnosis not present

## 2021-05-15 DIAGNOSIS — R279 Unspecified lack of coordination: Secondary | ICD-10-CM | POA: Diagnosis not present

## 2021-05-15 DIAGNOSIS — I4581 Long QT syndrome: Secondary | ICD-10-CM | POA: Diagnosis not present

## 2021-05-15 DIAGNOSIS — Z743 Need for continuous supervision: Secondary | ICD-10-CM | POA: Diagnosis not present

## 2021-05-15 DIAGNOSIS — S72001D Fracture of unspecified part of neck of right femur, subsequent encounter for closed fracture with routine healing: Secondary | ICD-10-CM

## 2021-05-15 DIAGNOSIS — R2689 Other abnormalities of gait and mobility: Secondary | ICD-10-CM | POA: Diagnosis not present

## 2021-05-15 LAB — MAGNESIUM: Magnesium: 2.1 mg/dL (ref 1.7–2.4)

## 2021-05-15 LAB — CBC WITH DIFFERENTIAL/PLATELET
Abs Immature Granulocytes: 0.04 10*3/uL (ref 0.00–0.07)
Basophils Absolute: 0.1 10*3/uL (ref 0.0–0.1)
Basophils Relative: 0 %
Eosinophils Absolute: 0 10*3/uL (ref 0.0–0.5)
Eosinophils Relative: 0 %
HCT: 38.5 % (ref 36.0–46.0)
Hemoglobin: 12.8 g/dL (ref 12.0–15.0)
Immature Granulocytes: 0 %
Lymphocytes Relative: 5 %
Lymphs Abs: 0.7 10*3/uL (ref 0.7–4.0)
MCH: 31.8 pg (ref 26.0–34.0)
MCHC: 33.2 g/dL (ref 30.0–36.0)
MCV: 95.5 fL (ref 80.0–100.0)
Monocytes Absolute: 0.8 10*3/uL (ref 0.1–1.0)
Monocytes Relative: 5 %
Neutro Abs: 13.7 10*3/uL — ABNORMAL HIGH (ref 1.7–7.7)
Neutrophils Relative %: 90 %
Platelets: 310 10*3/uL (ref 150–400)
RBC: 4.03 MIL/uL (ref 3.87–5.11)
RDW: 13.1 % (ref 11.5–15.5)
WBC: 15.3 10*3/uL — ABNORMAL HIGH (ref 4.0–10.5)
nRBC: 0 % (ref 0.0–0.2)

## 2021-05-15 LAB — BASIC METABOLIC PANEL
Anion gap: 8 (ref 5–15)
BUN: 17 mg/dL (ref 8–23)
CO2: 23 mmol/L (ref 22–32)
Calcium: 8.4 mg/dL — ABNORMAL LOW (ref 8.9–10.3)
Chloride: 103 mmol/L (ref 98–111)
Creatinine, Ser: 0.71 mg/dL (ref 0.44–1.00)
GFR, Estimated: >60 mL/min (ref >60)
Glucose, Bld: 153 mg/dL — ABNORMAL HIGH (ref 70–99)
Potassium: 4 mmol/L (ref 3.5–5.1)
Sodium: 134 mmol/L — ABNORMAL LOW (ref 135–145)

## 2021-05-15 LAB — RESP PANEL BY RT-PCR (FLU A&B, COVID) ARPGX2
Influenza A by PCR: NEGATIVE
Influenza B by PCR: NEGATIVE
SARS Coronavirus 2 by RT PCR: NEGATIVE

## 2021-05-15 LAB — PROTIME-INR
INR: 0.9 (ref 0.8–1.2)
Prothrombin Time: 12.6 seconds (ref 11.4–15.2)

## 2021-05-15 SURGERY — FIXATION, FRACTURE, INTERTROCHANTERIC, WITH INTRAMEDULLARY ROD
Anesthesia: Choice | Laterality: Right

## 2021-05-15 MED ORDER — POLYETHYLENE GLYCOL 3350 17 G PO PACK: 17.0000 g | PACK | Freq: Every day | ORAL | Status: DC | PRN

## 2021-05-15 MED ORDER — MORPHINE SULFATE (PF) 4 MG/ML IV SOLN
4.0000 mg | INTRAVENOUS | Status: DC | PRN
  Administered 2021-05-16: 4 mg via INTRAVENOUS
  Filled 2021-05-15: qty 1

## 2021-05-15 MED ORDER — ACETAMINOPHEN 325 MG PO TABS: 650.0000 mg | ORAL_TABLET | Freq: Four times a day (QID) | ORAL | Status: DC | PRN

## 2021-05-15 MED ORDER — SODIUM CHLORIDE 0.9 % IV SOLN: INTRAVENOUS | Status: DC

## 2021-05-15 MED ORDER — IPRATROPIUM-ALBUTEROL 0.5-2.5 (3) MG/3ML IN SOLN
3.0000 mL | Freq: Two times a day (BID) | RESPIRATORY_TRACT | Status: DC
  Administered 2021-05-16 – 2021-05-18 (×5): 3 mL via RESPIRATORY_TRACT
  Filled 2021-05-15 (×5): qty 3

## 2021-05-15 MED ORDER — IPRATROPIUM-ALBUTEROL 0.5-2.5 (3) MG/3ML IN SOLN
3.0000 mL | Freq: Four times a day (QID) | RESPIRATORY_TRACT | Status: DC
  Administered 2021-05-15: 3 mL via RESPIRATORY_TRACT
  Filled 2021-05-15: qty 3

## 2021-05-15 MED ORDER — MORPHINE SULFATE (PF) 4 MG/ML IV SOLN
4.0000 mg | INTRAVENOUS | Status: AC | PRN
  Administered 2021-05-15 (×2): 4 mg via INTRAVENOUS
  Filled 2021-05-15 (×2): qty 1

## 2021-05-15 MED ORDER — ACETAMINOPHEN 650 MG RE SUPP: 650.0000 mg | Freq: Four times a day (QID) | RECTAL | Status: DC | PRN

## 2021-05-15 MED ORDER — ONDANSETRON HCL 4 MG/2ML IJ SOLN
4.0000 mg | Freq: Once | INTRAMUSCULAR | Status: AC
  Administered 2021-05-15: 4 mg via INTRAVENOUS
  Filled 2021-05-15: qty 2

## 2021-05-15 MED ORDER — IPRATROPIUM-ALBUTEROL 0.5-2.5 (3) MG/3ML IN SOLN: 3.0000 mL | RESPIRATORY_TRACT | Status: DC | PRN

## 2021-05-15 MED ORDER — ALBUTEROL SULFATE HFA 108 (90 BASE) MCG/ACT IN AERS
2.0000 | INHALATION_SPRAY | Freq: Once | RESPIRATORY_TRACT | Status: AC
  Administered 2021-05-15: 2 via RESPIRATORY_TRACT
  Filled 2021-05-15: qty 6.7
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
## 2021-05-15 NOTE — Progress Notes (Signed)
Patient ID: Laura Foster, female   DOB: 02/05/1946, 75 y.o.   MRN: [id number] Past Medical History:  Diagnosis Date   COPD (chronic obstructive pulmonary disease) ([place])    Depression    Dyspnea    Hypercholesteremia    Hypothyroidism    Memory disorder 04/24/2018   Memory loss    Renal insufficiency
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
## 2021-05-16 ENCOUNTER — Encounter (HOSPITAL_COMMUNITY)
Admission: EM | Disposition: A | Discharge status: Skilled Nursing Facility | Payer: Self-pay | Source: Home / Self Care | Attending: Internal Medicine

## 2021-05-16 ENCOUNTER — Inpatient Hospital Stay (HOSPITAL_COMMUNITY): Payer: Medicare Other | Admitting: Certified Registered"

## 2021-05-16 ENCOUNTER — Inpatient Hospital Stay (HOSPITAL_COMMUNITY): Payer: Medicare Other

## 2021-05-16 ENCOUNTER — Encounter (HOSPITAL_COMMUNITY): Payer: Self-pay | Admitting: Internal Medicine

## 2021-05-16 DIAGNOSIS — F32A Depression, unspecified: Secondary | ICD-10-CM

## 2021-05-16 DIAGNOSIS — J449 Chronic obstructive pulmonary disease, unspecified: Secondary | ICD-10-CM

## 2021-05-16 DIAGNOSIS — R413 Other amnesia: Secondary | ICD-10-CM

## 2021-05-16 DIAGNOSIS — S72141A Displaced intertrochanteric fracture of right femur, initial encounter for closed fracture: Secondary | ICD-10-CM | POA: Diagnosis not present

## 2021-05-16 DIAGNOSIS — R9431 Abnormal electrocardiogram [ECG] [EKG]: Secondary | ICD-10-CM

## 2021-05-16 HISTORY — PX: INTRAMEDULLARY (IM) NAIL INTERTROCHANTERIC: SHX5875

## 2021-05-16 LAB — BASIC METABOLIC PANEL
Anion gap: 6 (ref 5–15)
BUN: 19 mg/dL (ref 8–23)
CO2: 25 mmol/L (ref 22–32)
Calcium: 8 mg/dL — ABNORMAL LOW (ref 8.9–10.3)
Chloride: 104 mmol/L (ref 98–111)
Creatinine, Ser: 0.71 mg/dL (ref 0.44–1.00)
GFR, Estimated: >60 mL/min (ref >60)
Glucose, Bld: 126 mg/dL — ABNORMAL HIGH (ref 70–99)
Potassium: 3.9 mmol/L (ref 3.5–5.1)
Sodium: 135 mmol/L (ref 135–145)

## 2021-05-16 LAB — TYPE AND SCREEN
ABO/RH(D): O POS
Antibody Screen: NEGATIVE

## 2021-05-16 LAB — SURGICAL PCR SCREEN
MRSA, PCR: NEGATIVE
Staphylococcus aureus: NEGATIVE

## 2021-05-16 LAB — CBC
HCT: 33.8 % — ABNORMAL LOW (ref 36.0–46.0)
Hemoglobin: 10.9 g/dL — ABNORMAL LOW (ref 12.0–15.0)
MCH: 32 pg (ref 26.0–34.0)
MCHC: 32.2 g/dL (ref 30.0–36.0)
MCV: 99.1 fL (ref 80.0–100.0)
Platelets: 274 10*3/uL (ref 150–400)
RBC: 3.41 MIL/uL — ABNORMAL LOW (ref 3.87–5.11)
RDW: 13.3 % (ref 11.5–15.5)
WBC: 8.6 10*3/uL (ref 4.0–10.5)
nRBC: 0 % (ref 0.0–0.2)

## 2021-05-16 LAB — ABO/RH: ABO/RH(D): O POS

## 2021-05-16 SURGERY — FIXATION, FRACTURE, INTERTROCHANTERIC, WITH INTRAMEDULLARY ROD
Anesthesia: Spinal | Site: Hip | Laterality: Right

## 2021-05-16 MED ORDER — VASOPRESSIN 20 UNIT/ML IV SOLN
INTRAVENOUS | Status: DC | PRN
  Administered 2021-05-16: 1 [IU] via INTRAVENOUS
  Administered 2021-05-16 (×3): .5 [IU] via INTRAVENOUS

## 2021-05-16 MED ORDER — SODIUM CHLORIDE 0.9 % IR SOLN
Status: DC | PRN
  Administered 2021-05-16: 1000 mL

## 2021-05-16 MED ORDER — FENTANYL CITRATE (PF) 100 MCG/2ML IJ SOLN
INTRAMUSCULAR | Status: DC | PRN
  Administered 2021-05-16 (×2): 50 ug via INTRAVENOUS

## 2021-05-16 MED ORDER — SODIUM CHLORIDE 0.9 % IV SOLN: INTRAVENOUS | Status: AC

## 2021-05-16 MED ORDER — PROPOFOL 10 MG/ML IV BOLUS
INTRAVENOUS | Status: AC
  Filled 2021-05-16: qty 20

## 2021-05-16 MED ORDER — FENTANYL CITRATE (PF) 100 MCG/2ML IJ SOLN
INTRAMUSCULAR | Status: AC
  Filled 2021-05-16: qty 2

## 2021-05-16 MED ORDER — EPHEDRINE 5 MG/ML INJ
INTRAVENOUS | Status: AC
  Filled 2021-05-16: qty 10

## 2021-05-16 MED ORDER — OXYCODONE-ACETAMINOPHEN 5-325 MG PO TABS
1.0000 | ORAL_TABLET | ORAL | Status: DC | PRN
  Administered 2021-05-17 (×2): 1 via ORAL
  Filled 2021-05-16 (×2): qty 1

## 2021-05-16 MED ORDER — CHLORHEXIDINE GLUCONATE 4 % EX LIQD
60.0000 mL | Freq: Once | CUTANEOUS | Status: AC
  Administered 2021-05-16: 4 via TOPICAL
  Filled 2021-05-16: qty 15

## 2021-05-16 MED ORDER — ACETAMINOPHEN 325 MG PO TABS: 325.0000 mg | ORAL_TABLET | Freq: Four times a day (QID) | ORAL | Status: DC | PRN

## 2021-05-16 MED ORDER — ONDANSETRON HCL 4 MG/2ML IJ SOLN
INTRAMUSCULAR | Status: AC
  Filled 2021-05-16: qty 2

## 2021-05-16 MED ORDER — PROPOFOL 10 MG/ML IV BOLUS
INTRAVENOUS | Status: AC
  Filled 2021-05-16: qty 40

## 2021-05-16 MED ORDER — EPHEDRINE SULFATE 50 MG/ML IJ SOLN
INTRAMUSCULAR | Status: DC | PRN
  Administered 2021-05-16: 10 mg via INTRAVENOUS
  Administered 2021-05-16 (×6): 5 mg via INTRAVENOUS

## 2021-05-16 MED ORDER — ACETAMINOPHEN 500 MG PO TABS
500.0000 mg | ORAL_TABLET | Freq: Four times a day (QID) | ORAL | Status: AC
  Administered 2021-05-16 – 2021-05-17 (×4): 500 mg via ORAL
  Filled 2021-05-16 (×4): qty 1

## 2021-05-16 MED ORDER — POVIDONE-IODINE 10 % EX SWAB: 2.0000 "application " | Freq: Once | CUTANEOUS | Status: DC

## 2021-05-16 MED ORDER — VANCOMYCIN HCL IN DEXTROSE 1-5 GM/200ML-% IV SOLN
1000.0000 mg | Freq: Two times a day (BID) | INTRAVENOUS | Status: AC
  Filled 2021-05-16: qty 200

## 2021-05-16 MED ORDER — LIDOCAINE HCL (PF) 2 % IJ SOLN
INTRAMUSCULAR | Status: AC
  Filled 2021-05-16: qty 5

## 2021-05-16 MED ORDER — BUPIVACAINE HCL (PF) 0.5 % IJ SOLN
INTRAMUSCULAR | Status: AC
  Filled 2021-05-16: qty 30

## 2021-05-16 MED ORDER — CHLORHEXIDINE GLUCONATE 0.12 % MT SOLN
15.0000 mL | Freq: Once | OROMUCOSAL | Status: AC
  Administered 2021-05-16: 15 mL via OROMUCOSAL

## 2021-05-16 MED ORDER — VANCOMYCIN HCL IN DEXTROSE 1-5 GM/200ML-% IV SOLN
1000.0000 mg | INTRAVENOUS | Status: AC
  Administered 2021-05-16: 1000 mg via INTRAVENOUS

## 2021-05-16 MED ORDER — CHLORHEXIDINE GLUCONATE CLOTH 2 % EX PADS
6.0000 | MEDICATED_PAD | Freq: Every day | CUTANEOUS | Status: DC
  Administered 2021-05-16 – 2021-05-18 (×3): 6 via TOPICAL

## 2021-05-16 MED ORDER — TRANEXAMIC ACID-NACL 1000-0.7 MG/100ML-% IV SOLN
1000.0000 mg | Freq: Once | INTRAVENOUS | Status: AC
  Administered 2021-05-16: 1000 mg via INTRAVENOUS
  Filled 2021-05-16: qty 100

## 2021-05-16 MED ORDER — METHOCARBAMOL 1000 MG/10ML IJ SOLN
500.0000 mg | Freq: Four times a day (QID) | INTRAVENOUS | Status: DC | PRN
  Filled 2021-05-16: qty 5

## 2021-05-16 MED ORDER — CELECOXIB 100 MG PO CAPS
200.0000 mg | ORAL_CAPSULE | Freq: Two times a day (BID) | ORAL | Status: DC
  Administered 2021-05-16 – 2021-05-18 (×4): 200 mg via ORAL
  Filled 2021-05-16 (×5): qty 2

## 2021-05-16 MED ORDER — MORPHINE SULFATE (PF) 2 MG/ML IV SOLN
0.5000 mg | INTRAVENOUS | Status: DC | PRN
  Administered 2021-05-16: 1 mg via INTRAVENOUS
  Filled 2021-05-16: qty 1

## 2021-05-16 MED ORDER — LIDOCAINE HCL (CARDIAC) PF 50 MG/5ML IV SOSY
PREFILLED_SYRINGE | INTRAVENOUS | Status: DC | PRN
  Administered 2021-05-16: 1 mL via INTRAVENOUS

## 2021-05-16 MED ORDER — BUPIVACAINE LIPOSOME 1.3 % IJ SUSP
INTRAMUSCULAR | Status: DC | PRN
  Administered 2021-05-16: 20 mL

## 2021-05-16 MED ORDER — ASPIRIN EC 325 MG PO TBEC
325.0000 mg | DELAYED_RELEASE_TABLET | Freq: Every day | ORAL | Status: DC
  Administered 2021-05-17 – 2021-05-18 (×2): 325 mg via ORAL
  Filled 2021-05-16 (×2): qty 1

## 2021-05-16 MED ORDER — VANCOMYCIN HCL IN DEXTROSE 1-5 GM/200ML-% IV SOLN
INTRAVENOUS | Status: AC
  Administered 2021-05-16: 1000 mg via INTRAVENOUS
  Filled 2021-05-16: qty 200

## 2021-05-16 MED ORDER — FENTANYL CITRATE (PF) 100 MCG/2ML IJ SOLN: 25.0000 ug | INTRAMUSCULAR | Status: DC | PRN

## 2021-05-16 MED ORDER — PHENYLEPHRINE 40 MCG/ML (10ML) SYRINGE FOR IV PUSH (FOR BLOOD PRESSURE SUPPORT)
PREFILLED_SYRINGE | INTRAVENOUS | Status: AC
  Filled 2021-05-16: qty 10

## 2021-05-16 MED ORDER — PHENOL 1.4 % MT LIQD: 1.0000 | OROMUCOSAL | Status: DC | PRN

## 2021-05-16 MED ORDER — LACTATED RINGERS IV SOLN: INTRAVENOUS | Status: DC

## 2021-05-16 MED ORDER — METHOCARBAMOL 500 MG PO TABS: 500.0000 mg | ORAL_TABLET | Freq: Four times a day (QID) | ORAL | Status: DC | PRN

## 2021-05-16 MED ORDER — ONDANSETRON HCL 4 MG/2ML IJ SOLN: 4.0000 mg | Freq: Four times a day (QID) | INTRAMUSCULAR | Status: DC | PRN

## 2021-05-16 MED ORDER — PROPOFOL 500 MG/50ML IV EMUL
INTRAVENOUS | Status: DC | PRN
  Administered 2021-05-16: 30 mg via INTRAVENOUS
  Administered 2021-05-16: 50 ug/kg/min via INTRAVENOUS

## 2021-05-16 MED ORDER — SENNOSIDES-DOCUSATE SODIUM 8.6-50 MG PO TABS: 1.0000 | ORAL_TABLET | Freq: Every evening | ORAL | Status: DC | PRN

## 2021-05-16 MED ORDER — ONDANSETRON HCL 4 MG PO TABS
4.0000 mg | ORAL_TABLET | Freq: Four times a day (QID) | ORAL | Status: DC | PRN
  Administered 2021-05-16 – 2021-05-17 (×2): 4 mg via ORAL
  Filled 2021-05-16 (×2): qty 1

## 2021-05-16 MED ORDER — VANCOMYCIN HCL IN DEXTROSE 1-5 GM/200ML-% IV SOLN: 1000.0000 mg | INTRAVENOUS | Status: DC

## 2021-05-16 MED ORDER — PHENYLEPHRINE HCL (PRESSORS) 10 MG/ML IV SOLN
INTRAVENOUS | Status: DC | PRN
  Administered 2021-05-16 (×4): 80 ug via INTRAVENOUS
  Administered 2021-05-16: 120 ug via INTRAVENOUS
  Administered 2021-05-16 (×2): 80 ug via INTRAVENOUS

## 2021-05-16 MED ORDER — BUPIVACAINE LIPOSOME 1.3 % IJ SUSP
INTRAMUSCULAR | Status: AC
  Filled 2021-05-16: qty 20

## 2021-05-16 MED ORDER — ORAL CARE MOUTH RINSE: 15.0000 mL | Freq: Once | OROMUCOSAL | Status: AC

## 2021-05-16 MED ORDER — VASOPRESSIN 20 UNIT/ML IV SOLN
INTRAVENOUS | Status: AC
  Filled 2021-05-16: qty 1

## 2021-05-16 MED ORDER — HYDROCODONE-ACETAMINOPHEN 5-325 MG PO TABS
1.0000 | ORAL_TABLET | ORAL | Status: DC | PRN
  Administered 2021-05-16 – 2021-05-18 (×3): 2 via ORAL
  Filled 2021-05-16 (×3): qty 2

## 2021-05-16 MED ORDER — DOCUSATE SODIUM 100 MG PO CAPS
100.0000 mg | ORAL_CAPSULE | Freq: Two times a day (BID) | ORAL | Status: DC
  Administered 2021-05-16 – 2021-05-18 (×4): 100 mg via ORAL
  Filled 2021-05-16 (×4): qty 1

## 2021-05-16 MED ORDER — CHLORHEXIDINE GLUCONATE 4 % EX LIQD: 60.0000 mL | Freq: Once | CUTANEOUS | Status: DC

## 2021-05-16 MED ORDER — MENTHOL 3 MG MT LOZG: 1.0000 | LOZENGE | OROMUCOSAL | Status: DC | PRN

## 2021-05-16 MED ORDER — BUPIVACAINE HCL (PF) 0.5 % IJ SOLN
INTRAMUSCULAR | Status: DC | PRN
  Administered 2021-05-16: 2.25 mL

## 2021-05-16 SURGICAL SUPPLY — 63 items
APL PRP STRL LF DISP 70% ISPRP (MISCELLANEOUS) ×1
BIT DRILL 4.0X280 (BIT) ×1 IMPLANT
BIT DRILL AO GAMMA 4.2X300 (BIT) ×2 IMPLANT
BLADE SURG SZ10 CARB STEEL (BLADE) ×4 IMPLANT
BNDG GAUZE ELAST 4 BULKY (GAUZE/BANDAGES/DRESSINGS) ×2 IMPLANT
CHLORAPREP W/TINT 26 (MISCELLANEOUS) ×2 IMPLANT
CLOSURE STERI STRIP 1/2 X4 (GAUZE/BANDAGES/DRESSINGS) ×1 IMPLANT
CLOTH BEACON ORANGE TIMEOUT ST (SAFETY) ×2 IMPLANT
COVER LIGHT HANDLE STERIS (MISCELLANEOUS) ×4 IMPLANT
COVER MAYO STAND XLG (MISCELLANEOUS) ×2 IMPLANT
COVER PERINEAL POST (MISCELLANEOUS) ×2 IMPLANT
COVER WAND RF STERILE (DRAPES) ×2 IMPLANT
DRAPE STERI IOBAN 125X83 (DRAPES) ×2 IMPLANT
DRSG MEPILEX BORDER 4X12 (GAUZE/BANDAGES/DRESSINGS) ×2 IMPLANT
DRSG MEPILEX SACRM 8.7X9.8 (GAUZE/BANDAGES/DRESSINGS) ×2 IMPLANT
DRSG PAD ABDOMINAL 8X10 ST (GAUZE/BANDAGES/DRESSINGS) ×2 IMPLANT
ELECT REM PT RETURN 9FT ADLT (ELECTROSURGICAL) ×2
ELECTRODE REM PT RTRN 9FT ADLT (ELECTROSURGICAL) ×1 IMPLANT
GLOVE SKINSENSE NS SZ8.0 LF (GLOVE) ×1
GLOVE SKINSENSE STRL SZ8.0 LF (GLOVE) ×1 IMPLANT
GLOVE SS N UNI LF 8.5 STRL (GLOVE) ×2 IMPLANT
GLOVE SURG UNDER POLY LF SZ7 (GLOVE) ×4 IMPLANT
GOWN STRL REUS W/TWL LRG LVL3 (GOWN DISPOSABLE) ×3 IMPLANT
GOWN STRL REUS W/TWL XL LVL3 (GOWN DISPOSABLE) ×2 IMPLANT
GUIDEROD T2 3X1000 (ROD) ×2 IMPLANT
GUIDEWIRE BALL NOSE 3.0X900 (WIRE) ×2
GUIDEWIRE ORTH 900X3XBALL NOSE (WIRE) IMPLANT
INST SET MAJOR BONE (KITS) ×2 IMPLANT
K-WIRE  3.2X450M STR (WIRE) ×2
K-WIRE 3.2X450M STR (WIRE) ×1
KIT BLADEGUARD II DBL (SET/KITS/TRAYS/PACK) ×2 IMPLANT
KIT TURNOVER CYSTO (KITS) ×2 IMPLANT
KWIRE 3.2X450M STR (WIRE) ×1 IMPLANT
MANIFOLD NEPTUNE II (INSTRUMENTS) ×2 IMPLANT
MARKER SKIN DUAL TIP RULER LAB (MISCELLANEOUS) ×2 IMPLANT
NAIL ES TROCH RT 10X33X125 (Nail) ×1 IMPLANT
NDL HYPO 18GX1.5 BLUNT FILL (NEEDLE) IMPLANT
NDL HYPO 21X1.5 SAFETY (NEEDLE) ×1 IMPLANT
NDL SPNL 18GX3.5 QUINCKE PK (NEEDLE) ×1 IMPLANT
NEEDLE HYPO 18GX1.5 BLUNT FILL (NEEDLE) ×2 IMPLANT
NEEDLE HYPO 21X1.5 SAFETY (NEEDLE) ×2 IMPLANT
NEEDLE SPNL 18GX3.5 QUINCKE PK (NEEDLE) ×2 IMPLANT
NS IRRIG 1000ML POUR BTL (IV SOLUTION) ×2 IMPLANT
PACK BASIC III (CUSTOM PROCEDURE TRAY) ×2
PACK SRG BSC III STRL LF ECLPS (CUSTOM PROCEDURE TRAY) ×1 IMPLANT
PAD ARMBOARD 7.5X6 YLW CONV (MISCELLANEOUS) ×2 IMPLANT
PENCIL SMOKE EVACUATOR COATED (MISCELLANEOUS) ×2 IMPLANT
PIN GUIDE THRD AR 3.2X330 (PIN) ×1 IMPLANT
SCREW LAG GALILEO 10.5X105 (Screw) ×1 IMPLANT
SCREW LOCK CORT 5X36 (Screw) ×1 IMPLANT
SET BASIN LINEN APH (SET/KITS/TRAYS/PACK) ×2 IMPLANT
SPONGE LAP 18X18 RF (DISPOSABLE) ×4 IMPLANT
STAPLER VISISTAT 35W (STAPLE) ×1 IMPLANT
STRIP CLOSURE SKIN 1/2X4 (GAUZE/BANDAGES/DRESSINGS) ×1 IMPLANT
SUT BRALON NAB BRD #1 30IN (SUTURE) ×2 IMPLANT
SUT MNCRL 0 VIOLET CTX 36 (SUTURE) ×1 IMPLANT
SUT MON AB 2-0 CT1 36 (SUTURE) ×2 IMPLANT
SUT MONOCRYL 0 CTX 36 (SUTURE) ×2
SYR 30ML LL (SYRINGE) ×3 IMPLANT
SYR BULB IRRIG 60ML STRL (SYRINGE) ×4 IMPLANT
TOOL ACTIVATION (INSTRUMENTS) ×1 IMPLANT
TRAY FOLEY MTR SLVR 16FR STAT (SET/KITS/TRAYS/PACK) ×2 IMPLANT
YANKAUER SUCT BULB TIP NO VENT (SUCTIONS) ×2 IMPLANT

## 2021-05-16 NOTE — Progress Notes (Signed)
Gave patient her incentive spirometer and explained how to use it.  Patient gave a good effort and was able to reach 756ml X10.  Left IS at bedside with patient and told her to use it when awake 10X q1hr.  Patient understood but stated she was sleepy.  I had to place patient on 2L [place] due to pulse ox check revealing that she was 84% on RA.  Patient is currently at 94% on 2L [place].  Patient does have an upper airway wheeze, but none was heard in the lungs.

## 2021-05-16 NOTE — Anesthesia Postprocedure Evaluation (Signed)
Anesthesia Post Note  Patient: Laura Foster  Procedure(s) Performed: INTRAMEDULLARY (IM) NAIL INTERTROCHANTRIC (Right: Hip)  Patient location during evaluation: Phase II Anesthesia Type: Spinal Level of consciousness: awake Pain management: pain level controlled Vital Signs Assessment: post-procedure vital signs reviewed and stable Respiratory status: spontaneous breathing and respiratory function stable Cardiovascular status: blood pressure returned to baseline and stable Postop Assessment: no headache and no apparent nausea or vomiting Anesthetic complications: no Comments: Late entry   No notable events documented.   Last Vitals:  Vitals:   05/16/21 1408 05/16/21 1415  BP:  (!) 87/57  Pulse:    Resp:  18  Temp:    SpO2: 96% 100%    Last Pain:  Vitals:   05/16/21 1408  TempSrc:   PainSc: 0-No pain                 Louann Sjogren

## 2021-05-16 NOTE — Brief Op Note (Signed)
05/15/2021 - 05/16/2021  1:31 PM  PATIENT:  Wendie Agreste  75 y.o. female  PRE-OPERATIVE DIAGNOSIS:  right intertrochanteric hip fracture  POST-OPERATIVE DIAGNOSIS:  right intertrochanteric hip fracture  PROCEDURE:  Procedure(s): INTRAMEDULLARY (IM) NAIL INTERTROCHANTRIC (Right)  Implants Arthrex 125 x 330 mm intramedullary [place] nail with a 105 lag screw and a distal locking screw 36 mm  SURGEON:  Surgeon(s) and Role:    Carole Civil, MD - Primary  PHYSICIAN ASSISTANT:   ASSISTANTS: Corrie Dandy  ANESTHESIA:   spinal  EBL:  150cc   BLOOD ADMINISTERED:none  DRAINS: none   LOCAL MEDICATIONS USED:  Amount: Exparel 20 full-strength ml  SPECIMEN:  No Specimen  DISPOSITION OF SPECIMEN:  N/A  COUNTS:  YES  TOURNIQUET:  * No tourniquets in log *  DICTATION: .Dragon Dictation  PLAN OF CARE: Admit to inpatient   PATIENT DISPOSITION:  PACU - hemodynamically stable.   Delay start of Pharmacological VTE agent (>24hrs) due to surgical blood loss or risk of bleeding: yes

## 2021-05-16 NOTE — Interval H&P Note (Signed)
History and Physical Interval Note:  05/16/2021 11:36 AM  Laura Foster  has presented today for surgery, with the diagnosis of right hip intertrochanteric hip fracture.  The various methods of treatment have been discussed with the patient and family. After consideration of risks, benefits and other options for treatment, the patient has consented to  Procedure(s): INTRAMEDULLARY (IM) NAIL INTERTROCHANTRIC (Right) as a surgical intervention.  The patient's history has been reviewed, patient examined, no change in status, stable for surgery.  I have reviewed the patient's chart and labs.  Questions were answered to the patient's satisfaction.     Arther Abbott

## 2021-05-16 NOTE — NC FL2 (Signed)
[place] LEVEL OF CARE SCREENING TOOL     IDENTIFICATION  Patient Name: Laura Foster Birthdate: [birth year] Sex: female Admission Date (Current Location): 05/15/2021  [facility] and [place] Number:  [employer] and Address:  [place] [address], [place]      Provider Number: [id number]  Attending Physician Name and Address:  Barton Dubois, MD  Relative Name and Phone Number:       Current Level of Care: Hospital Recommended Level of Care: [place] Prior Approval Number:    Date Approved/Denied:   PASRR Number: pending  Discharge Plan: SNF    Current Diagnoses: Patient Active Problem List   Diagnosis Date Noted   Closed displaced intertrochanteric fracture of right femur ([place]) 05/15/2021   Prolonged QT interval 05/15/2021   COPD (chronic obstructive pulmonary disease) ([place]) 05/15/2021   Depression 05/15/2021   Memory disorder 04/24/2018   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 See d/c summary for updates.)  AMBULATORY STATUS COMMUNICATION OF NEEDS Skin   Extensive Assist Verbally Surgical wounds                       Personal Care Assistance Level of Assistance  Bathing, Dressing, Feeding Bathing Assistance: Maximum assistance Feeding assistance: Limited assistance Dressing Assistance: Maximum assistance     Functional Limitations Info  Sight, Speech, Hearing Sight Info: Impaired Hearing Info: Adequate Speech Info: Adequate    SPECIAL CARE FACTORS FREQUENCY  PT (By licensed PT)     PT Frequency: 5x weekly              Contractures      Additional Factors Info  Code Status, Allergies,  Psychotropic Code Status Info: Full code Allergies Info: Penicillins Psychotropic Info: Xanax, Celexa, Namenda, Aricept         Current Medications (05/16/2021):  This is the current hospital active medication list Current Facility-Administered Medications  Medication Dose Route Frequency Provider Last Rate Last Admin   0.9 %  sodium chloride infusion   Intravenous Continuous Carole Civil, MD       [START ON 05/17/2021] acetaminophen (TYLENOL) tablet 325-650 mg  325-650 mg Oral Q6H PRN Carole Civil, MD       acetaminophen (TYLENOL) tablet 500 mg  500 mg Oral Q6H Carole Civil, MD       [START ON 05/17/2021] aspirin EC tablet 325 mg  325 mg Oral Q breakfast Carole Civil, MD       celecoxib (CELEBREX) capsule 200 mg  200 mg Oral BID Carole Civil, MD       docusate sodium (COLACE) capsule 100 mg  100 mg Oral BID Carole Civil, MD       HYDROcodone-acetaminophen (NORCO/VICODIN) 5-325 MG per tablet 1-2 tablet  1-2 tablet Oral Q4H PRN Carole Civil, MD       ipratropium-albuterol (DUONEB) 0.5-2.5 (3) MG/3ML nebulizer solution 3 mL  3 mL Nebulization Q4H PRN Carole Civil, MD       ipratropium-albuterol (DUONEB) 0.5-2.5 (3) MG/3ML nebulizer solution 3 mL  3 mL Nebulization BID Carole Civil, MD   3 mL at 05/16/21 8937   menthol-cetylpyridinium (  CEPACOL) lozenge 3 mg  1 lozenge Oral PRN Carole Civil, MD       Or   phenol (CHLORASEPTIC) mouth spray 1 spray  1 spray Mouth/Throat PRN Carole Civil, MD       methocarbamol (ROBAXIN) tablet 500 mg  500 mg Oral Q6H PRN Carole Civil, MD       Or   methocarbamol (ROBAXIN) 500 mg in dextrose 5 % 50 mL IVPB  500 mg Intravenous Q6H PRN Carole Civil, MD       morphine 2 MG/ML injection 0.5-1 mg  0.5-1 mg Intravenous Q2H PRN Carole Civil, MD       ondansetron [facility]) tablet 4 mg  4 mg Oral Q6H PRN Carole Civil, MD       Or   ondansetron [facility]) injection 4 mg  4  mg Intravenous Q6H PRN Carole Civil, MD       polyethylene glycol (MIRALAX / GLYCOLAX) packet 17 g  17 g Oral Daily PRN Carole Civil, MD       senna-docusate (Senokot-S) tablet 1 tablet  1 tablet Oral QHS PRN Carole Civil, MD       tranexamic acid (CYKLOKAPRON) IVPB 1,000 mg  1,000 mg Intravenous Once Carole Civil, MD       vancomycin (VANCOCIN) IVPB 1000 mg/200 mL premix  1,000 mg Intravenous Q12H Carole Civil, MD         Discharge Medications: Please see discharge summary for a list of discharge medications.  Relevant Imaging Results:  Relevant Lab Results:   Additional Information SSN: [id number].  Salome Arnt, LCSW

## 2021-05-16 NOTE — Transfer of Care (Signed)
Immediate Anesthesia Transfer of Care Note  Patient: Laura Foster  Procedure(s) Performed: INTRAMEDULLARY (IM) NAIL INTERTROCHANTRIC (Right: Hip)  Patient Location: PACU  Anesthesia Type:Spinal  Level of Consciousness: awake, alert , oriented and patient cooperative  Airway & Oxygen Therapy: Patient Spontanous Breathing and Patient connected to face mask oxygen  Post-op Assessment: Report given to RN, Post -op Vital signs reviewed and stable and Patient moving all extremities  Post vital signs: Reviewed and stable  Last Vitals:  Vitals Value Taken Time  BP 115/62 05/16/21 1334  Temp    Pulse 87 05/16/21 1336  Resp 24 05/16/21 1336  SpO2 96 % 05/16/21 1336  Vitals shown include unvalidated device data.  Last Pain:  Vitals:   05/16/21 1034  TempSrc: Oral  PainSc: 0-No pain      Patients Stated Pain Goal: 5 (23/30/07 6226)  Complications: No notable events documented.

## 2021-05-16 NOTE — Op Note (Signed)
Postoperative plan Weightbearing as tolerated Staples if present can be removed postop day 12-14 Anticoagulation for 28 days Follow-up visit at 4 weeks for x-rays and then x-rays at 6 weeks and 12 weeks [id number]

## 2021-05-16 NOTE — Op Note (Signed)
Open treatment internal fixation of the hip with CEPHALOMEDULLARY nail  Implant Arthrex intramedullary nail  Size 330 mm length, 125 degree, 105 mm lag screw, distal locking screw 36 mm  05/16/2021 1:32 PM   Preop diagnosis right hip fracture intertrochanteric  Postoperative diagnosis same Procedure open treatment internal fixation of the right hip Surgeon [facility] Anesthesia spinal Surgical findings comminuted four-part unstable right INTERTROCHANTERIC HIP FRACTURE ; The greater trochanter was a separate fragment from the shaft and neck  BLOOD ADMINISTERED:none  DRAINS: none   LOCAL MEDICATIONS USED: Exparel 20   SPECIMEN:  No Specimen  DISPOSITION OF SPECIMEN:  N/A  COUNTS:  CORRECT   DICTATION: .Dragon Dictation   Surgeon Aline Brochure  The patient was taken to the recovery room in stable condition  PLAN OF CARE: Discharge to home after PACU  PATIENT DISPOSITION:  PACU - hemodynamically stable.   Delay start of Pharmacological VTE agent (>24hrs) due to surgical blood loss or risk of bleeding: Yes    The surgery was done as follows: The patient was identified in the preop area the surgical site (right thigh) was confirmed and marked after thorough chart review including radiographs; implants were checked personally.  The patient was brought back to the operating room for anesthesia and then was placed on the fracture table. The operative leg was placed in traction the nonoperative leg was padded and placed in a boot and abducted  The C-arm was brought in and a closed reduction was performed with the C-arm.  Traction and internal rotation and pressure on the proximal femur posterior to anterior reduce the fracture, multiplane x-rays were taken to confirm the reduction   The right leg was prepped and draped with ChloraPrep. This was followed by timeout which confirmed as surgical site, implants, x-ray gowns and badges.  The incision was made  over the right hip at the greater trochanter proximally, subcutaneous tissue was divided down to the fascial layer which was split in line with the skin and incision.  This was followed by blunt dissection with a finger down to the tip of the trochanter  The sharp tipped awl was passed down to the level lesser TROCHANTER  followed by insertion of a guidewire down to the knee.  X-ray confirmed position of the guidewire and proximal reaming was performed down to the level of the lesser trochanter.  Measurement was taken from the intramedullary wire.  330 mm nail chosen based on a 340 mm length, we then passed a  125 degree nail.  A second incision was made distal to the initial incision and the subcutaneous tissue was divided, muscle and fascia were split down to bone   After correction for rotation and posterior to anterior pressure on the proximal femur a threaded tip guidewire was passed through the cannula down to the subchondral bone  Guidewire was placed in the center center position on AP and lateral x-ray  We measured the pin distance at 115 mm and then set the reamer to 105 mm ;  followed by reaming over the guidewire with posterior to anterior pressure on the proximal femur.  The lag screw was then placed over the guidewire and the guidewire was removed.  Images confirmed a good tip to apex distance and the distal incision was made to place the distal locking screw through the guide.  Final images confirmed position of all hardware with good reduction noted  The wound was irrigated with copious amounts of saline and closed in  layered fashion starting with 0 Monocryl, #1 Bralon, 0 Monocryl and 2-0 Monocryl for the distal incision  Then we injected 20 cc of exparel  A sterile bandage was applied  Postoperative plan Weightbearing as tolerated Staples if present can be removed postop day 12-14 Anticoagulation for 28 days Follow-up visit at 4 weeks for x-rays and then x-rays at 6 weeks  and 12 weeks [id number]  SURGEON:  Surgeon(s) and Role:    Carole Civil, MD - Primary
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
## 2021-05-16 NOTE — Progress Notes (Signed)
  DOB: [birth year] Date: 05/16/2021   Must ID: [id number]   To Whom it May Concern:  Please be advised that the above named patient will require a short-term nursing home stay- anticipated 30 days or less rehabilitation and strengthening. The plan is for return home.

## 2021-05-16 NOTE — Progress Notes (Signed)
PROGRESS NOTE    Laura Foster  WVP:[id number] DOB: 28-Nov-1946 DOA: 05/15/2021 PCP: [name], MD    Chief Complaint  Patient presents with   Fall    Brief admission narrative:  As per H&P written by Dr. Denton Brick on 05/15/2021 Laura Foster is a 75 y.o. female with medical history significant for COPD, memory problems, hypothyroidism. Patient was brought to the Ed with reports of a fall today.  Patient was in the kitchen, standing when suddenly her knees gave way, and she was on the floor.  She was conscious the whole time, she is sure she did not hit her head, and she landed on her buttock.  She denies prior dizziness, no chest pain, no difficulty breathing, and reports chronic unchanged cough.  No vomiting, no loose stools and has had good oral intake.   ED Course: Stable vitals.  Leukocytosis of 15.3.  X-ray shows comminuted intratrochanteric right femoral fracture.  05/16/21 Seen by Dr. Aline Brochure this morning with anticipated surgical repair later today.  Assessment & Plan: 1-Close right hip fracture -per X-rays images, acute right intertrochanteric femoral fracture. -continue PRN analgesics -Continue n.p.o. status for now -Follow orthopedic service recommendations postoperatively.  2-Memory disorder -Continue supportive care and constant reorientation. -Continue the use of Aricept  -Patient at high risk for developing hospital-acquired delirium  3-hypothyroidism -Continue symptomatic  4-history of COPD -Not requiring oxygen supplementation -Mild expiratory wheezing appreciated. -No use of accessory muscles. Continue as needed bronchodilators -Patient counseled to quit smoking. -Recommended use of flutter valve and incentive spirometer after surgery.  5-tobacco abuse -smoking cessation counseling provided.  6-depression/anxiety -Stable mood -Continue Celexa and as needed Xanax.  7-Prolonged QT interval -Stable -No electrolytes abnormalities  appreciated -Continue monitoring on telemetry for 24 hours. -minimize the use of medications that can prolong QT.   DVT prophylaxis: SCDs Code Status: Full code Family Communication: Sister at bedside Disposition:   Status is: Inpatient  Dispo: The patient is from: Home              Anticipated d/c is to: to be determined.              Patient currently is not medically stable to d/c. Pending surgical repair of hip fracture.   Difficult to place patient No     Consultants:  Orthopedic service  Procedures:  Planning for ORIF right hip with IM nail later today 05/16/21  Antimicrobials:  None   Subjective: No fever, no chest pain, no nausea, no vomiting.  Reports pain with movement of right leg.  Objective: Vitals:   05/16/21 0158 05/16/21 0426 05/16/21 0554 05/16/21 0700  BP: (!) 117/58 122/69 108/74   Pulse: 95 79 79   Resp: 18  18   Temp: 98.7 F (37.1 C)  (!) 97.5 F (36.4 C)   TempSrc: Oral     SpO2: 98%  97% 94%  Weight:      Height:        Intake/Output Summary (Last 24 hours) at 05/16/2021 0833 Last data filed at 05/16/2021 0307 Gross per 24 hour  Intake 914.93 ml  Output --  Net 914.93 ml   Filed Weights   05/15/21 1443  Weight: 69.9 kg    Examination:  General exam: Afebrile, in no major distress while still Respiratory system: Clear to auscultation. Respiratory effort normal. Cardiovascular system: S1 & S2 heard, RRR. No JVD, murmurs, rubs, gallops or clicks. No pedal edema. Gastrointestinal system: Abdomen is nondistended, soft and nontender. No  organomegaly or masses felt. Normal bowel sounds heard. Central nervous system: No focal neurological deficits. Extremities: No cyanosis or clubbing. Skin: No  Psychiatry: Mood & affect appropriate currently.     Data Reviewed: I have personally reviewed following labs and imaging studies  CBC: Recent Labs  Lab 05/15/21 1511 05/16/21 0421  WBC 15.3* 8.6  NEUTROABS 13.7*  --   HGB 12.8  10.9*  HCT 38.5 33.8*  MCV 95.5 99.1  PLT 310 283    Basic Metabolic Panel: Recent Labs  Lab 05/15/21 1511 05/15/21 1512 05/16/21 0421  NA 134*  --  135  K 4.0  --  3.9  CL 103  --  104  CO2 23  --  25  GLUCOSE 153*  --  126*  BUN 17  --  19  CREATININE 0.71  --  0.71  CALCIUM 8.4*  --  8.0*  MG  --  2.1  --     GFR: Estimated Creatinine Clearance: 57.8 mL/min (by C-G formula based on SCr of 0.71 mg/dL).   Recent Results (from the past 240 hour(s))  Resp Panel by RT-PCR (Flu A&B, Covid) Nasopharyngeal Swab     Status: None   Collection Time: 05/15/21  3:12 PM   Specimen: Nasopharyngeal Swab; Nasopharyngeal(NP) swabs in vial transport medium  Result Value Ref Range Status   SARS Coronavirus 2 by RT PCR NEGATIVE NEGATIVE Final    Comment: (NOTE) SARS-CoV-2 target nucleic acids are NOT DETECTED.  The SARS-CoV-2 RNA is generally detectable in upper respiratory specimens during the acute phase of infection. The lowest concentration of SARS-CoV-2 viral copies this assay can detect is 138 copies/mL. A negative result does not preclude SARS-Cov-2 infection and should not be used as the sole basis for treatment or other patient management decisions. A negative result may occur with  improper specimen collection/handling, submission of specimen other than nasopharyngeal swab, presence of viral mutation(s) within the areas targeted by this assay, and inadequate number of viral copies(<138 copies/mL). A negative result must be combined with clinical observations, patient history, and epidemiological information. The expected result is Negative.  Fact Sheet for Patients:  EntrepreneurPulse.com.au  Fact Sheet for Healthcare Providers:  IncredibleEmployment.be  This test is no t yet approved or cleared by the [place] FDA and  has been authorized for detection and/or diagnosis of SARS-CoV-2 by FDA under an Emergency Use Authorization  (EUA). This EUA will remain  in effect (meaning this test can be used) for the duration of the COVID-19 declaration under Section 564(b)(1) of the Act, 21 U.S.C.section 360bbb-3(b)(1), unless the authorization is terminated  or revoked sooner.       Influenza A by PCR NEGATIVE NEGATIVE Final   Influenza B by PCR NEGATIVE NEGATIVE Final    Comment: (NOTE) The Xpert Xpress SARS-CoV-2/FLU/RSV plus assay is intended as an aid in the diagnosis of influenza from Nasopharyngeal swab specimens and should not be used as a sole basis for treatment. Nasal washings and aspirates are unacceptable for Xpert Xpress SARS-CoV-2/FLU/RSV testing.  Fact Sheet for Patients: EntrepreneurPulse.com.au  Fact Sheet for Healthcare Providers: IncredibleEmployment.be  This test is not yet approved or cleared by the [place] FDA and has been authorized for detection and/or diagnosis of SARS-CoV-2 by FDA under an Emergency Use Authorization (EUA). This EUA will remain in effect (meaning this test can be used) for the duration of the COVID-19 declaration under Section 564(b)(1) of the Act, 21 U.S.C. section 360bbb-3(b)(1), unless the authorization is terminated or revoked.  Performed at [facility], [address] [place], [place] [address]          Radiology Studies: DG Chest 1 View  Result Date: 05/15/2021 CLINICAL DATA:  Right hip pain following fall, initial encounter EXAM: CHEST  1 VIEW COMPARISON:  06/20/2020 FINDINGS: Cardiac shadow is within normal limits. The lungs are well aerated bilaterally. No focal infiltrate or sizable effusion is seen. Bony abnormality is noted IMPRESSION: No active disease. Electronically Signed   By: Inez Catalina M.D.   On: 05/15/2021 16:21   DG Pelvis 1-2 Views  Result Date: 05/15/2021 CLINICAL DATA:  Recent fall with pelvic pain on the right, initial encounter EXAM: PELVIS - 1 VIEW COMPARISON:  None. FINDINGS: Comminuted  right intratrochanteric fracture is noted with mild impaction at the fracture site. Medullary rod is noted within the proximal left femur. Pelvic ring appears intact. No soft tissue abnormality is noted. IMPRESSION: Right intratrochanteric femoral fracture. Electronically Signed   By: Inez Catalina M.D.   On: 05/15/2021 16:20   DG FEMUR, MIN 2 VIEWS RIGHT  Result Date: 05/15/2021 CLINICAL DATA:  Recent with right hip pain, initial encounter EXAM: RIGHT FEMUR 2 VIEWS COMPARISON:  None. FINDINGS: Comminuted intratrochanteric fracture of the proximal right femur is noted. No dislocation is seen. Distal femur is within normal limits. No gross soft tissue abnormality is noted. IMPRESSION: Comminuted intratrochanteric right femoral fracture. Electronically Signed   By: Inez Catalina M.D.   On: 05/15/2021 16:20        Scheduled Meds:  chlorhexidine  60 mL Topical Once   chlorhexidine  60 mL Topical Once   ipratropium-albuterol  3 mL Nebulization BID   povidone-iodine  2 application Topical Once   povidone-iodine  2 application Topical Once   Continuous Infusions:  sodium chloride 75 mL/hr at 05/16/21 0202   vancomycin       LOS: 1 day    Time spent: 30 minutes    Barton Dubois, MD [facility]   To contact the attending provider between 7A-7P or the covering provider during after hours 7P-7A, please log into the web site [email or web address] and access using universal [place] password for that web site. If you do not have the password, please call the hospital operator.  05/16/2021, 8:33 AM
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
## 2021-05-16 NOTE — Plan of Care (Signed)

## 2021-05-16 NOTE — Anesthesia Procedure Notes (Signed)
Spinal  Start time: 05/16/2021 11:41 AM End time: 05/16/2021 11:44 AM Reason for block: surgical anesthesia Staffing Performed: resident/CRNA  Resident/CRNA: Tacy Learn, CRNA Preanesthetic Checklist Completed: patient identified, IV checked, site marked, risks and benefits discussed, surgical consent, monitors and equipment checked, pre-op evaluation and timeout performed Spinal Block Patient position: right lateral decubitus Prep: ChloraPrep Patient monitoring: heart rate, cardiac monitor, continuous pulse ox and blood pressure Approach: right paramedian Location: L3-4 Injection technique: single-shot Needle Needle type: Quincke  Needle gauge: 22 G Needle length: 10 cm Assessment Sensory level: T4 Events: CSF return Additional Notes Pt tolerated well

## 2021-05-16 NOTE — Anesthesia Preprocedure Evaluation (Signed)
Anesthesia Evaluation  Patient identified by MRN, date of birth, ID band Patient awake    Reviewed: Allergy & Precautions, H&P , NPO status , Patient's Chart, lab work & pertinent test results, reviewed documented beta blocker date and time   Airway Mallampati: II  TM Distance: >3 FB Neck ROM: full    Dental no notable dental hx.    Pulmonary COPD,  COPD inhaler, Current Smoker and Patient abstained from smoking.,    Pulmonary exam normal breath sounds clear to auscultation       Cardiovascular Exercise Tolerance: Good negative cardio ROS   Rhythm:regular Rate:Normal     Neuro/Psych PSYCHIATRIC DISORDERS Depression negative neurological ROS     GI/Hepatic negative GI ROS, Neg liver ROS,   Endo/Other  Hypothyroidism   Renal/GU CRFRenal disease  negative genitourinary   Musculoskeletal   Abdominal   Peds  Hematology negative hematology ROS (+)   Anesthesia Other Findings   Reproductive/Obstetrics negative OB ROS                             Anesthesia Physical Anesthesia Plan  ASA: 2  Anesthesia Plan: Spinal   Post-op Pain Management:    Induction:   PONV Risk Score and Plan: Propofol infusion  Airway Management Planned:   Additional Equipment:   Intra-op Plan:   Post-operative Plan:   Informed Consent: I have reviewed the patients History and Physical, chart, labs and discussed the procedure including the risks, benefits and alternatives for the proposed anesthesia with the patient or authorized representative who has indicated his/her understanding and acceptance.     Dental Advisory Given  Plan Discussed with: CRNA  Anesthesia Plan Comments:         Anesthesia Quick Evaluation

## 2021-05-17 ENCOUNTER — Encounter (HOSPITAL_COMMUNITY): Payer: Self-pay | Admitting: Orthopedic Surgery

## 2021-05-17 DIAGNOSIS — S72141G Displaced intertrochanteric fracture of right femur, subsequent encounter for closed fracture with delayed healing: Secondary | ICD-10-CM | POA: Diagnosis not present

## 2021-05-17 DIAGNOSIS — R413 Other amnesia: Secondary | ICD-10-CM | POA: Diagnosis not present

## 2021-05-17 LAB — CBC
HCT: 28.3 % — ABNORMAL LOW (ref 36.0–46.0)
Hemoglobin: 9.1 g/dL — ABNORMAL LOW (ref 12.0–15.0)
MCH: 32.3 pg (ref 26.0–34.0)
MCHC: 32.2 g/dL (ref 30.0–36.0)
MCV: 100.4 fL — ABNORMAL HIGH (ref 80.0–100.0)
Platelets: 236 10*3/uL (ref 150–400)
RBC: 2.82 MIL/uL — ABNORMAL LOW (ref 3.87–5.11)
RDW: 13.1 % (ref 11.5–15.5)
WBC: 10.2 10*3/uL (ref 4.0–10.5)
nRBC: 0 % (ref 0.0–0.2)

## 2021-05-17 MED ORDER — CITALOPRAM HYDROBROMIDE 20 MG PO TABS
10.0000 mg | ORAL_TABLET | Freq: Every day | ORAL | Status: DC
  Administered 2021-05-17 – 2021-05-18 (×2): 10 mg via ORAL
  Filled 2021-05-17 (×2): qty 1

## 2021-05-17 MED ORDER — DONEPEZIL HCL 5 MG PO TABS
5.0000 mg | ORAL_TABLET | Freq: Every day | ORAL | Status: DC
  Administered 2021-05-17: 5 mg via ORAL
  Filled 2021-05-17: qty 1

## 2021-05-17 MED ORDER — LEVOTHYROXINE SODIUM 75 MCG PO TABS
75.0000 ug | ORAL_TABLET | Freq: Every day | ORAL | Status: DC
  Administered 2021-05-18: 75 ug via ORAL
  Filled 2021-05-17: qty 1

## 2021-05-17 NOTE — Progress Notes (Signed)
PROGRESS NOTE  Laura Foster TMA:[id number] DOB: Aug 16, 1946 DOA: 05/15/2021 PCP: [name], MD  Brief History:   75 y.o. female with medical history significant for COPD, memory loss, hypothyroidism. Patient was brought to the Ed with reports of a fall today.  Patient was in the kitchen, standing when suddenly her knees gave way, and she was on the floor.  She was conscious the whole time, she is sure she did not hit her head, and she landed on her buttock.  She denies prior dizziness, no chest pain, no difficulty breathing, and reports chronic unchanged cough.  No vomiting, no loose stools and has had good oral intake.  Assessment/Plan: Close right hip fracture--comminuted intertrochanteric femur fx -appreciate ortho -continue PRN analgesics -6/14/2200R-IM nail -Follow orthopedic service recommendations postoperatively. -PT>>SNF   Memory loss -Continue supportive care and constant reorientation. -Continue Aricept  -Patient at high risk for developing hospital-acquired delirium   hypothyroidism -Continue synthroid   COPD -Mild expiratory wheezing appreciated. -No use of accessory muscles. Continue as needed bronchodilators -Patient counseled to quit smoking. -Recommended use of flutter valve and incentive spirometer after surgery.   tobacco abuse -smoking cessation counseling provided.   depression/anxiety -Stable mood -Continue Celexa and as needed Xanax.   Prolonged QT interval -Stable -No electrolytes abnormalities appreciated -Continue monitoring on telemetry -minimize the use of medications that can prolong QT.      Status is: Inpatient  Remains inpatient appropriate because:IV treatments appropriate due to intensity of illness or inability to take PO  Dispo: The patient is from: Home              Anticipated d/c is to: SNF              Patient currently is not medically stable to d/c.   Difficult to place patient No        Family  Communication:   no Family at bedside  Consultants:  ortho  Code Status:  FULL   DVT Prophylaxis:  ASA 325 mg   Procedures: As Listed in Progress Note Above  Antibiotics: None       Subjective: Patient denies fevers, chills, headache, chest pain, dyspnea, nausea, vomiting, diarrhea, abdominal pain   Objective: Vitals:   05/16/21 2026 05/17/21 0402 05/17/21 0713 05/17/21 1315  BP: 117/72 101/61  116/63  Pulse: 99 98  (!) 110  Resp: 18 18  18   Temp: 97.9 F (36.6 C) 98.3 F (36.8 C)  97.7 F (36.5 C)  TempSrc: Oral Oral  Oral  SpO2: 94% 94% 96%   Weight:      Height:        Intake/Output Summary (Last 24 hours) at 05/17/2021 1754 Last data filed at 05/17/2021 1039 Gross per 24 hour  Intake 1415.54 ml  Output 250 ml  Net 1165.54 ml   Weight change:  Exam:  General:  Pt is alert, follows commands appropriately, not in acute distress HEENT: No icterus, No thrush, No neck mass, [place]/AT Cardiovascular: RRR, S1/S2, no rubs, no gallops Respiratory: bibasilar rales. No wheeze Abdomen: Soft/+BS, non tender, non distended, no guarding Extremities: No edema, No lymphangitis, No petechiae, No rashes, no synovitis   Data Reviewed: I have personally reviewed following labs and imaging studies Basic Metabolic Panel: Recent Labs  Lab 05/15/21 1511 05/15/21 1512 05/16/21 0421  NA 134*  --  135  K 4.0  --  3.9  CL 103  --  104  CO2  23  --  25  GLUCOSE 153*  --  126*  BUN 17  --  19  CREATININE 0.71  --  0.71  CALCIUM 8.4*  --  8.0*  MG  --  2.1  --    Liver Function Tests: No results for input(s): AST, ALT, ALKPHOS, BILITOT, PROT, ALBUMIN in the last 168 hours. No results for input(s): LIPASE, AMYLASE in the last 168 hours. No results for input(s): AMMONIA in the last 168 hours. Coagulation Profile: Recent Labs  Lab 05/15/21 1511  INR 0.9   CBC: Recent Labs  Lab 05/15/21 1511 05/16/21 0421 05/17/21 0437  WBC 15.3* 8.6 10.2  NEUTROABS 13.7*  --   --    HGB 12.8 10.9* 9.1*  HCT 38.5 33.8* 28.3*  MCV 95.5 99.1 100.4*  PLT 310 274 236   Cardiac Enzymes: No results for input(s): CKTOTAL, CKMB, CKMBINDEX, TROPONINI in the last 168 hours. BNP: Invalid input(s): POCBNP CBG: No results for input(s): GLUCAP in the last 168 hours. HbA1C: No results for input(s): HGBA1C in the last 72 hours. Urine analysis: No results found for: COLORURINE, APPEARANCEUR, LABSPEC, PHURINE, GLUCOSEU, HGBUR, BILIRUBINUR, KETONESUR, PROTEINUR, UROBILINOGEN, NITRITE, LEUKOCYTESUR Sepsis Labs: @LABRCNTIP (procalcitonin:4,lacticidven:4) ) Recent Results (from the past 240 hour(s))  Resp Panel by RT-PCR (Flu A&B, Covid) Nasopharyngeal Swab     Status: None   Collection Time: 05/15/21  3:12 PM   Specimen: Nasopharyngeal Swab; Nasopharyngeal(NP) swabs in vial transport medium  Result Value Ref Range Status   SARS Coronavirus 2 by RT PCR NEGATIVE NEGATIVE Final    Comment: (NOTE) SARS-CoV-2 target nucleic acids are NOT DETECTED.  The SARS-CoV-2 RNA is generally detectable in upper respiratory specimens during the acute phase of infection. The lowest concentration of SARS-CoV-2 viral copies this assay can detect is 138 copies/mL. A negative result does not preclude SARS-Cov-2 infection and should not be used as the sole basis for treatment or other patient management decisions. A negative result may occur with  improper specimen collection/handling, submission of specimen other than nasopharyngeal swab, presence of viral mutation(s) within the areas targeted by this assay, and inadequate number of viral copies(<138 copies/mL). A negative result must be combined with clinical observations, patient history, and epidemiological information. The expected result is Negative.  Fact Sheet for Patients:  EntrepreneurPulse.com.au  Fact Sheet for Healthcare Providers:  IncredibleEmployment.be  This test is no t yet approved or  cleared by the [place] FDA and  has been authorized for detection and/or diagnosis of SARS-CoV-2 by FDA under an Emergency Use Authorization (EUA). This EUA will remain  in effect (meaning this test can be used) for the duration of the COVID-19 declaration under Section 564(b)(1) of the Act, 21 U.S.C.section 360bbb-3(b)(1), unless the authorization is terminated  or revoked sooner.       Influenza A by PCR NEGATIVE NEGATIVE Final   Influenza B by PCR NEGATIVE NEGATIVE Final    Comment: (NOTE) The Xpert Xpress SARS-CoV-2/FLU/RSV plus assay is intended as an aid in the diagnosis of influenza from Nasopharyngeal swab specimens and should not be used as a sole basis for treatment. Nasal washings and aspirates are unacceptable for Xpert Xpress SARS-CoV-2/FLU/RSV testing.  Fact Sheet for Patients: EntrepreneurPulse.com.au  Fact Sheet for Healthcare Providers: IncredibleEmployment.be  This test is not yet approved or cleared by the [place] FDA and has been authorized for detection and/or diagnosis of SARS-CoV-2 by FDA under an Emergency Use Authorization (EUA). This EUA will remain in effect (meaning this test can be  used) for the duration of the COVID-19 declaration under Section 564(b)(1) of the Act, 21 U.S.C. section 360bbb-3(b)(1), unless the authorization is terminated or revoked.  Performed at [facility], [address], [place], [place] [address]   Surgical pcr screen     Status: None   Collection Time: 05/16/21  7:05 AM  Result Value Ref Range Status   MRSA, PCR NEGATIVE NEGATIVE Final   Staphylococcus aureus NEGATIVE NEGATIVE Final    Comment: (NOTE) The Xpert SA Assay (FDA approved for NASAL specimens in patients 36 years of age and older), is one component of a comprehensive surveillance program. It is not intended to diagnose infection nor to guide or monitor treatment. Performed at [facility], [address],  [place], [place] [address]      Scheduled Meds:  aspirin EC  325 mg Oral Q breakfast   celecoxib  200 mg Oral BID   Chlorhexidine Gluconate Cloth  6 each Topical Daily   docusate sodium  100 mg Oral BID   ipratropium-albuterol  3 mL Nebulization BID   Continuous Infusions:  methocarbamol (ROBAXIN) IV      Procedures/Studies: DG Chest 1 View  Result Date: 05/15/2021 CLINICAL DATA:  Right hip pain following fall, initial encounter EXAM: CHEST  1 VIEW COMPARISON:  06/20/2020 FINDINGS: Cardiac shadow is within normal limits. The lungs are well aerated bilaterally. No focal infiltrate or sizable effusion is seen. Bony abnormality is noted IMPRESSION: No active disease. Electronically Signed   By: Inez Catalina M.D.   On: 05/15/2021 16:21   DG Pelvis 1-2 Views  Result Date: 05/16/2021 CLINICAL DATA:  Post IM nail RIGHT femur EXAM: PELVIS - 1-2 VIEW COMPARISON:  Portable exam 1356 hours compared to earlier intraoperative images of 05/16/2021 FINDINGS: IM nail with compression screw at RIGHT femur across a reduced intertrochanteric fracture. Old IM nail with locking screw at proximal LEFT femur. Bones demineralized. Hip and SI joint spaces preserved. No additional fracture or dislocation seen. Atherosclerotic calcifications aorta and iliac arteries. IMPRESSION: Post nailing of intertrochanteric fracture RIGHT femur. Aortic Atherosclerosis (ICD10-I70.0). Electronically Signed   By: Lavonia Dana M.D.   On: 05/16/2021 14:19   DG Pelvis 1-2 Views  Result Date: 05/15/2021 CLINICAL DATA:  Recent fall with pelvic pain on the right, initial encounter EXAM: PELVIS - 1 VIEW COMPARISON:  None. FINDINGS: Comminuted right intratrochanteric fracture is noted with mild impaction at the fracture site. Medullary rod is noted within the proximal left femur. Pelvic ring appears intact. No soft tissue abnormality is noted. IMPRESSION: Right intratrochanteric femoral fracture. Electronically Signed   By: Inez Catalina M.D.    On: 05/15/2021 16:20   DG HIP OPERATIVE UNILAT W OR W/O PELVIS RIGHT  Result Date: 05/16/2021 CLINICAL DATA:  RIGHT hip fracture, ORIF EXAM: OPERATIVE RIGHT HIP (WITH PELVIS IF PERFORMED) 12 VIEWS TECHNIQUE: Fluoroscopic spot image(s) were submitted for interpretation post-operatively. COMPARISON:  05/15/2021 FLUOROSCOPY TIME:  3 minutes 19 seconds FINDINGS: Images demonstrate placement of an IM nail with a compression screw across a intertrochanteric fracture of the RIGHT femur. Bones demineralized. No dislocation or additional fracture identified. IMPRESSION: Post ORIF of intertrochanteric fracture RIGHT femur Electronically Signed   By: Lavonia Dana M.D.   On: 05/16/2021 13:39   DG FEMUR, MIN 2 VIEWS RIGHT  Result Date: 05/16/2021 CLINICAL DATA:  Post RIGHT femoral nail placement EXAM: RIGHT FEMUR 2 VIEWS COMPARISON:  Portable exam 1357 hours compared intraoperative images of 05/16/2021 FINDINGS: IM nail with compression screw at proximal RIGHT  femur across a reduced intertrochanteric fracture. Locking screw at mid femur. No additional fracture or dislocation identified. Joint space narrowing RIGHT knee. RIGHT hip joint space preserved. IMPRESSION: Post ORIF intertrochanteric fracture RIGHT femur. No acute complications. Electronically Signed   By: Lavonia Dana M.D.   On: 05/16/2021 14:20   DG FEMUR, MIN 2 VIEWS RIGHT  Result Date: 05/15/2021 CLINICAL DATA:  Recent with right hip pain, initial encounter EXAM: RIGHT FEMUR 2 VIEWS COMPARISON:  None. FINDINGS: Comminuted intratrochanteric fracture of the proximal right femur is noted. No dislocation is seen. Distal femur is within normal limits. No gross soft tissue abnormality is noted. IMPRESSION: Comminuted intratrochanteric right femoral fracture. Electronically Signed   By: Inez Catalina M.D.   On: 05/15/2021 16:20    Orson Eva, DO  [facility]  If 7PM-7AM, please contact night-coverage [email or web address] Password [email or web address] 05/17/2021, 5:54 PM    LOS: 2 days

## 2021-05-17 NOTE — TOC Progression Note (Signed)
Transition of Care [facility]) - Progression Note    Patient Details  Name: Laura Foster MRN: [id number] Date of Birth: 1946-04-16  Transition of [facility]) CM/SW Contact  Salome Arnt, [place] Phone Number: 05/17/2021, 10:31 AM  Clinical Narrative:  LCSW presented bed offer at [facility] and pt accepts. Facility notified. Will start authorization.      Expected Discharge Plan: [place] Barriers to Discharge: Continued Medical Work up  Expected Discharge Plan and Services Expected Discharge Plan: [place] In-house Referral: Clinical Social Work   Post Acute Care Choice: [place] Living arrangements for the past 2 months: Single Family Home                 DME Arranged: N/A DME Agency: NA                   Social Determinants of Health (SDOH) Interventions    Readmission Risk Interventions No flowsheet data found.

## 2021-05-17 NOTE — Evaluation (Signed)
Physical Therapy Evaluation Patient Details Name: Laura Foster MRN: [id number] DOB: 28-Jul-1946 Today's Date: 05/17/2021   History of Present Illness  Laura Foster is a 75 y.o. female, s/p ORIF Right hip fracture on 05/16/21 with medical history significant for COPD, memory problems, hypothyroidism.  Patient was brought to the Ed with reports of a fall today.  Patient was in the kitchen, standing when suddenly her knees gave way, and she was on the floor.  She was conscious the whole time, she is sure she did not hit her head, and she landed on her buttock.  She denies prior dizziness, no chest pain, no difficulty breathing, and reports chronic unchanged cough.  No vomiting, no loose stools and has had good oral intake.   Clinical Impression  Patient demonstrates slow labored movement for sitting up at bedside with c/o severe pain in right hip with any movement, very unsteady on feet at high risk for falls due to poor tolerance for weightbearing on RLE, limited to a few shuffling side steps before having to sit due to severe pain.  Patient tolerated sitting up in chair after therapy with family members in room - RN notified.  Patient will benefit from continued physical therapy in hospital and recommended venue below to increase strength, balance, endurance for safe ADLs and gait.      Follow Up Recommendations SNF    Equipment Recommendations  None recommended by PT    Recommendations for Other Services       Precautions / Restrictions Precautions Precautions: Fall Restrictions Weight Bearing Restrictions: Yes RLE Weight Bearing: Weight bearing as tolerated      Mobility  Bed Mobility Overal bed mobility: Needs Assistance Bed Mobility: Supine to Sit     Supine to sit: Mod assist     General bed mobility comments: slow labored movement with c/o severe pain right hip with movement    Transfers Overall transfer level: Needs assistance Equipment used: Rolling walker (2  wheeled) Transfers: Sit to/from [facility] Sit to Stand: Mod assist Stand pivot transfers: Mod assist       General transfer comment: increased time, labored movement, has difficulty moving RLE due to increased pain  Ambulation/Gait Ambulation/Gait assistance: Mod assist;Max assist Gait Distance (Feet): 4 Feet Assistive device: Rolling walker (2 wheeled) Gait Pattern/deviations: Decreased step length - right;Decreased step length - left;Decreased stride length;Decreased stance time - right;Antalgic;Shuffle Gait velocity: slow   General Gait Details: limited to a few slow labored side steps with shuffling of right foot due to severe pain  Stairs            Wheelchair Mobility    Modified Rankin (Stroke Patients Only)       Balance Overall balance assessment: Needs assistance Sitting-balance support: Feet supported;No upper extremity supported Sitting balance-Leahy Scale: Fair Sitting balance - Comments: seated at EOB   Standing balance support: During functional activity;Bilateral upper extremity supported Standing balance-Leahy Scale: Poor Standing balance comment: fair/poor using RW                             Pertinent Vitals/Pain Pain Assessment: Faces Faces Pain Scale: Hurts whole lot Pain Location: right hip with movement Pain Descriptors / Indicators: Sore;Grimacing;Guarding Pain Intervention(s): Limited activity within patient's tolerance;Monitored during session;Repositioned;Premedicated before session    Home Living Family/patient expects to be discharged to:: Private residence Living Arrangements: Alone Available Help at Discharge: Family;Available PRN/intermittently Type of Home: House Home Access:  Stairs to enter Entrance Stairs-Rails: None Entrance Stairs-Number of Steps: 1 Home Layout: One level Home Equipment: [place] - 2 wheels;Cane - single point;Shower seat;Bedside commode      Prior Function Level of  Independence: Needs assistance   Gait / Transfers Assistance Needed: Community ambulator without AD, does not drive  ADL's / Homemaking Assistance Needed: assisted by family for community ADLs        Hand Dominance   Dominant Hand: Left    Extremity/Trunk Assessment   Upper Extremity Assessment Upper Extremity Assessment: Generalized weakness    Lower Extremity Assessment Lower Extremity Assessment: Generalized weakness;RLE deficits/detail RLE Deficits / Details: grossly -3/5 RLE: Unable to fully assess due to pain RLE Sensation: WNL RLE Coordination: WNL    Cervical / Trunk Assessment Cervical / Trunk Assessment: Normal  Communication   Communication: No difficulties  Cognition Arousal/Alertness: Awake/alert Behavior During Therapy: WFL for tasks assessed/performed Overall Cognitive Status: Within Functional Limits for tasks assessed                                        General Comments      Exercises     Assessment/Plan    PT Assessment Patient needs continued PT services  PT Problem List Decreased strength;Decreased activity tolerance;Decreased balance;Decreased mobility       PT Treatment Interventions DME instruction;Gait training;Stair training;Functional mobility training;Therapeutic activities;Therapeutic exercise;Balance training;Patient/family education    PT Goals (Current goals can be found in the Care Plan section)  Acute Rehab PT Goals Patient Stated Goal: return home after rehab PT Goal Formulation: With patient Time For Goal Achievement: 05/31/21 Potential to Achieve Goals: Good    Frequency Min 3X/week   Barriers to discharge        Co-evaluation               AM-PAC PT "6 Clicks" Mobility  Outcome Measure Help needed turning from your back to your side while in a flat bed without using bedrails?: A Lot Help needed moving from lying on your back to sitting on the side of a flat bed without using bedrails?:  A Lot Help needed moving to and from a bed to a chair (including a wheelchair)?: A Lot Help needed standing up from a chair using your arms (e.g., wheelchair or bedside chair)?: A Lot Help needed to walk in hospital room?: A Lot Help needed climbing 3-5 steps with a railing? : Total 6 Click Score: 11    End of Session Equipment Utilized During Treatment: Oxygen Activity Tolerance: Patient tolerated treatment well;Patient limited by fatigue Patient left: in chair;with call bell/phone within reach Nurse Communication: Mobility status PT Visit Diagnosis: Unsteadiness on feet (R26.81);Other abnormalities of gait and mobility (R26.89);Muscle weakness (generalized) (M62.81)    Time: 2119-4174 PT Time Calculation (min) (ACUTE ONLY): 21 min   Charges:   PT Evaluation $PT Eval Moderate Complexity: 1 Mod PT Treatments $Therapeutic Activity: 8-22 mins        11:03 AM, 05/17/21 Lonell Grandchild, MPT Physical Therapist with [facility] [phone] office [phone] mobile phone

## 2021-05-17 NOTE — Progress Notes (Signed)
Patient ID: Laura Foster, female   DOB: 09/25/1946, 75 y.o.   MRN: [id number] [facility] [place]  Postop day 1 status post open treatment internal fixation right hip with intramedullary nailing  BP 101/61 (BP Location: Right Arm)   Pulse 98   Temp 98.3 F (36.8 C) (Oral)   Resp 18   Ht 5\' 5"  (1.651 m)   Wt 69.9 kg   SpO2 96%   BMI 25.63 kg/m   Postop x-rays showed hardware in good position  Patient can start weightbearing as tolerated with therapy  Patient was in good spirits today pain was controlled neurovascular exam is intact dressing had a few areas of scant bloody drainage  CBC Latest Ref Rng & Units 05/17/2021 05/16/2021 05/15/2021  WBC 4.0 - 10.5 K/uL 10.2 8.6 15.3(H)  Hemoglobin 12.0 - 15.0 g/dL 9.1(L) 10.9(L) 12.8  Hematocrit 36.0 - 46.0 % 28.3(L) 33.8(L) 38.5  Platelets 150 - 400 K/uL 236 274 310   Postoperative orthopedic plan Discharge when bed available Remove staples postop day 14 Arrange office visit postop day 28-30 DVT prophylaxis aspirin for 30 days Weightbearing status as tolerated

## 2021-05-17 NOTE — Plan of Care (Signed)
  Problem: Acute Rehab PT Goals(only PT should resolve) Goal: Pt Will Go Supine/Side To Sit Outcome: Progressing Flowsheets (Taken 05/17/2021 1105) Pt will go Supine/Side to Sit:  with minimal assist  with moderate assist Goal: Patient Will Transfer Sit To/From Stand Outcome: Progressing Flowsheets (Taken 05/17/2021 1105) Patient will transfer sit to/from stand:  with minimal assist  with moderate assist Goal: Pt Will Transfer Bed To Chair/Chair To Bed Outcome: Progressing Flowsheets (Taken 05/17/2021 1105) Pt will Transfer Bed to Chair/Chair to Bed:  with min assist  with mod assist Goal: Pt Will Ambulate Outcome: Progressing Flowsheets (Taken 05/17/2021 1105) Pt will Ambulate:  25 feet  with moderate assist  with rolling walker   11:06 AM, 05/17/21 Lonell Grandchild, MPT Physical Therapist with [facility] [phone] office [phone] mobile phone

## 2021-05-17 NOTE — TOC Initial Note (Signed)
Transition of Care [facility]) - Initial/Assessment Note    Patient Details  Name: Laura Foster MRN: [id number] Date of Birth: 10-15-1946  Transition of Care [facility]) CM/SW Contact:    Salome Arnt, [place] Phone Number: 05/17/2021, 9:42 AM  Clinical Narrative:  Pt admitted due to right hip fracture. Postop day 1. Pt reports she lives alone. Her daughter is at bedside and indicates she lives out of town. Pt is independent with ADLs at baseline. PT evaluation pending. Discussed that pt will likely require short term SNF prior to return home. Request [facility]. Pt has not received COVID vaccinations. They are aware [facility] is only accepting COVID vaccinated patients. TOC will follow up with bed offers and start SNF authorization after PT evaluation.                  Expected Discharge Plan: Skilled Nursing Facility Barriers to Discharge: Continued Medical Work up   Patient Goals and CMS Choice Patient states their goals for this hospitalization and ongoing recovery are:: anticipated short term SNF prior to return home   Choice offered to / list presented to : Patient  Expected Discharge Plan and Services Expected Discharge Plan: [place] In-house Referral: Clinical Social Work   Post Acute Care Choice: [place] Living arrangements for the past 2 months: [place]                 DME Arranged: N/A DME Agency: NA                  Prior Living Arrangements/Services Living arrangements for the past 2 months: Single Family Home Lives with:: Self Patient language and need for interpreter reviewed:: Yes Do you feel safe going back to the place where you live?: Yes      Need for Family Participation in Patient Care: No (Comment)     Criminal Activity/Legal Involvement Pertinent to Current Situation/Hospitalization: No - Comment as needed  Activities of Daily Living Home Assistive Devices/Equipment: None ADL Screening (condition at  time of admission) Patient's cognitive ability adequate to safely complete daily activities?: Yes Is the patient deaf or have difficulty hearing?: No Does the patient have difficulty seeing, even when wearing glasses/contacts?: No Does the patient have difficulty concentrating, remembering, or making decisions?: No Patient able to express need for assistance with ADLs?: No Does the patient have difficulty dressing or bathing?: No Independently performs ADLs?: Yes (appropriate for developmental age) Does the patient have difficulty walking or climbing stairs?: No Weakness of Legs: Right Weakness of Arms/Hands: None  Permission Sought/Granted                  Emotional Assessment   Attitude/Demeanor/Rapport: Engaged Affect (typically observed): Accepting Orientation: : Oriented to Place, Oriented to Self, Oriented to  Time, Oriented to Situation Alcohol / Substance Use: Not Applicable Psych Involvement: No (comment)  Admission diagnosis:  Fall [W19.XXXA] Closed right hip fracture ([place]) [S72.001A] Closed displaced intertrochanteric fracture of right femur, initial encounter [facility]) [S72.141A] Patient Active Problem List   Diagnosis Date Noted   Closed displaced intertrochanteric fracture of right femur ([place]) 05/15/2021   Prolonged QT interval 05/15/2021   COPD (chronic obstructive pulmonary disease) ([place]) 05/15/2021   Depression 05/15/2021   Memory disorder 04/24/2018   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

## 2021-05-18 DIAGNOSIS — J9601 Acute respiratory failure with hypoxia: Secondary | ICD-10-CM

## 2021-05-18 DIAGNOSIS — W19XXXD Unspecified fall, subsequent encounter: Secondary | ICD-10-CM | POA: Diagnosis present

## 2021-05-18 DIAGNOSIS — E785 Hyperlipidemia, unspecified: Secondary | ICD-10-CM | POA: Diagnosis not present

## 2021-05-18 DIAGNOSIS — Z79890 Hormone replacement therapy (postmenopausal): Secondary | ICD-10-CM | POA: Diagnosis not present

## 2021-05-18 DIAGNOSIS — R410 Disorientation, unspecified: Secondary | ICD-10-CM | POA: Diagnosis not present

## 2021-05-18 DIAGNOSIS — F419 Anxiety disorder, unspecified: Secondary | ICD-10-CM | POA: Diagnosis present

## 2021-05-18 DIAGNOSIS — R279 Unspecified lack of coordination: Secondary | ICD-10-CM | POA: Diagnosis not present

## 2021-05-18 DIAGNOSIS — Z880 Allergy status to penicillin: Secondary | ICD-10-CM | POA: Diagnosis not present

## 2021-05-18 DIAGNOSIS — Z7982 Long term (current) use of aspirin: Secondary | ICD-10-CM | POA: Diagnosis not present

## 2021-05-18 DIAGNOSIS — K264 Chronic or unspecified duodenal ulcer with hemorrhage: Secondary | ICD-10-CM | POA: Diagnosis not present

## 2021-05-18 DIAGNOSIS — K922 Gastrointestinal hemorrhage, unspecified: Secondary | ICD-10-CM | POA: Diagnosis not present

## 2021-05-18 DIAGNOSIS — I4581 Long QT syndrome: Secondary | ICD-10-CM | POA: Diagnosis not present

## 2021-05-18 DIAGNOSIS — Z7952 Long term (current) use of systemic steroids: Secondary | ICD-10-CM | POA: Diagnosis not present

## 2021-05-18 DIAGNOSIS — E039 Hypothyroidism, unspecified: Secondary | ICD-10-CM | POA: Diagnosis not present

## 2021-05-18 DIAGNOSIS — Z20822 Contact with and (suspected) exposure to covid-19: Secondary | ICD-10-CM | POA: Diagnosis not present

## 2021-05-18 DIAGNOSIS — S72141D Displaced intertrochanteric fracture of right femur, subsequent encounter for closed fracture with routine healing: Secondary | ICD-10-CM | POA: Diagnosis not present

## 2021-05-18 DIAGNOSIS — E7800 Pure hypercholesterolemia, unspecified: Secondary | ICD-10-CM | POA: Diagnosis not present

## 2021-05-18 DIAGNOSIS — J441 Chronic obstructive pulmonary disease with (acute) exacerbation: Secondary | ICD-10-CM

## 2021-05-18 DIAGNOSIS — R413 Other amnesia: Secondary | ICD-10-CM | POA: Diagnosis not present

## 2021-05-18 DIAGNOSIS — R2681 Unsteadiness on feet: Secondary | ICD-10-CM | POA: Diagnosis not present

## 2021-05-18 DIAGNOSIS — F32A Depression, unspecified: Secondary | ICD-10-CM | POA: Diagnosis not present

## 2021-05-18 DIAGNOSIS — Z9981 Dependence on supplemental oxygen: Secondary | ICD-10-CM | POA: Diagnosis not present

## 2021-05-18 DIAGNOSIS — S72141A Displaced intertrochanteric fracture of right femur, initial encounter for closed fracture: Secondary | ICD-10-CM | POA: Diagnosis not present

## 2021-05-18 DIAGNOSIS — Z833 Family history of diabetes mellitus: Secondary | ICD-10-CM | POA: Diagnosis not present

## 2021-05-18 DIAGNOSIS — R58 Hemorrhage, not elsewhere classified: Secondary | ICD-10-CM | POA: Diagnosis not present

## 2021-05-18 DIAGNOSIS — R2689 Other abnormalities of gait and mobility: Secondary | ICD-10-CM | POA: Diagnosis not present

## 2021-05-18 DIAGNOSIS — F17210 Nicotine dependence, cigarettes, uncomplicated: Secondary | ICD-10-CM | POA: Diagnosis not present

## 2021-05-18 DIAGNOSIS — Z79899 Other long term (current) drug therapy: Secondary | ICD-10-CM | POA: Diagnosis not present

## 2021-05-18 DIAGNOSIS — I700 Atherosclerosis of aorta: Secondary | ICD-10-CM | POA: Diagnosis not present

## 2021-05-18 DIAGNOSIS — R0602 Shortness of breath: Secondary | ICD-10-CM | POA: Diagnosis not present

## 2021-05-18 DIAGNOSIS — K2970 Gastritis, unspecified, without bleeding: Secondary | ICD-10-CM | POA: Diagnosis not present

## 2021-05-18 DIAGNOSIS — M6281 Muscle weakness (generalized): Secondary | ICD-10-CM | POA: Diagnosis not present

## 2021-05-18 DIAGNOSIS — F0390 Unspecified dementia without behavioral disturbance: Secondary | ICD-10-CM | POA: Diagnosis present

## 2021-05-18 DIAGNOSIS — E890 Postprocedural hypothyroidism: Secondary | ICD-10-CM | POA: Diagnosis not present

## 2021-05-18 DIAGNOSIS — D62 Acute posthemorrhagic anemia: Secondary | ICD-10-CM | POA: Diagnosis not present

## 2021-05-18 DIAGNOSIS — Z807 Family history of other malignant neoplasms of lymphoid, hematopoietic and related tissues: Secondary | ICD-10-CM | POA: Diagnosis not present

## 2021-05-18 DIAGNOSIS — K269 Duodenal ulcer, unspecified as acute or chronic, without hemorrhage or perforation: Secondary | ICD-10-CM | POA: Diagnosis not present

## 2021-05-18 DIAGNOSIS — R109 Unspecified abdominal pain: Secondary | ICD-10-CM | POA: Diagnosis not present

## 2021-05-18 DIAGNOSIS — Z820 Family history of epilepsy and other diseases of the nervous system: Secondary | ICD-10-CM | POA: Diagnosis not present

## 2021-05-18 DIAGNOSIS — Z743 Need for continuous supervision: Secondary | ICD-10-CM | POA: Diagnosis not present

## 2021-05-18 DIAGNOSIS — J449 Chronic obstructive pulmonary disease, unspecified: Secondary | ICD-10-CM | POA: Diagnosis not present

## 2021-05-18 DIAGNOSIS — Z8249 Family history of ischemic heart disease and other diseases of the circulatory system: Secondary | ICD-10-CM | POA: Diagnosis not present

## 2021-05-18 DIAGNOSIS — R412 Retrograde amnesia: Secondary | ICD-10-CM | POA: Diagnosis not present

## 2021-05-18 LAB — CBC
HCT: 27.8 % — ABNORMAL LOW (ref 36.0–46.0)
Hemoglobin: 8.9 g/dL — ABNORMAL LOW (ref 12.0–15.0)
MCH: 32 pg (ref 26.0–34.0)
MCHC: 32 g/dL (ref 30.0–36.0)
MCV: 100 fL (ref 80.0–100.0)
Platelets: 230 10*3/uL (ref 150–400)
RBC: 2.78 MIL/uL — ABNORMAL LOW (ref 3.87–5.11)
RDW: 13.1 % (ref 11.5–15.5)
WBC: 9.5 10*3/uL (ref 4.0–10.5)
nRBC: 0 % (ref 0.0–0.2)

## 2021-05-18 MED ORDER — METHYLPREDNISOLONE SODIUM SUCC 125 MG IJ SOLR
60.0000 mg | Freq: Once | INTRAMUSCULAR | Status: AC
  Administered 2021-05-18: 60 mg via INTRAVENOUS
  Filled 2021-05-18: qty 2

## 2021-05-18 MED ORDER — ALPRAZOLAM 0.5 MG PO TABS: 0.5000 mg | ORAL_TABLET | Freq: Every morning | ORAL | 0 refills | Status: DC

## 2021-05-18 MED ORDER — PREDNISONE 20 MG PO TABS: 50.0000 mg | ORAL_TABLET | Freq: Every day | ORAL | Status: DC

## 2021-05-18 MED ORDER — HYDROCODONE-ACETAMINOPHEN 5-325 MG PO TABS: 1.0000 | ORAL_TABLET | ORAL | 0 refills | Status: DC | PRN

## 2021-05-18 MED ORDER — ASPIRIN 325 MG PO TBEC: 325.0000 mg | DELAYED_RELEASE_TABLET | Freq: Every day | ORAL | 0 refills | Status: DC

## 2021-05-18 MED ORDER — PREDNISONE 50 MG PO TABS: 50.0000 mg | ORAL_TABLET | Freq: Every day | ORAL | Status: DC

## 2021-05-18 MED ORDER — COVID-19 MRNA VAC-TRIS(PFIZER) 30 MCG/0.3ML IM SUSP
0.3000 mL | Freq: Once | INTRAMUSCULAR | Status: AC
  Administered 2021-05-18: 0.3 mL via INTRAMUSCULAR
  Filled 2021-05-18: qty 0.3

## 2021-05-18 MED ORDER — IPRATROPIUM-ALBUTEROL 0.5-2.5 (3) MG/3ML IN SOLN: 3.0000 mL | RESPIRATORY_TRACT | Status: DC | PRN

## 2021-05-18 NOTE — TOC Transition Note (Signed)
Transition of Care [facility]) - CM/SW Discharge Note   Patient Details  Name: Laura Foster MRN: [id number] Date of Birth: 04-02-1946  [facility] Care [facility]) CM/SW Contact:  Natasha Bence, LCSW Phone Number: 05/18/2021, 3:06 PM   Clinical Narrative:    CSW notified of patient's readiness for discharge. Debbie with [facility] agreeable to take patient. CSW completed med necessity and called EMS. Nurse to call report. TOC signing off.    Final next level of care: Skilled Nursing Facility Barriers to Discharge: Barriers Resolved   Patient Goals and CMS Choice Patient states their goals for this hospitalization and ongoing recovery are:: Rehab with SNF CMS Medicare.gov Compare Post Acute Care list provided to:: Patient Choice offered to / list presented to : Patient  Discharge Placement              Patient chooses bed at: [facility] Patient to be transferred to facility by: [facility] Name of family member notified: Army Chaco (Sister)   [phone] Patient and family notified of of transfer: 05/18/21  Discharge Plan and Services In-house Referral: Clinical Social Work   Post Acute Care Choice: [place]          DME Arranged: N/A DME Agency: NA                  Social Determinants of Health (SDOH) Interventions     Readmission Risk Interventions No flowsheet data found.

## 2021-05-18 NOTE — Discharge Summary (Signed)
Physician Discharge Summary  Laura Foster IWP:[id number] DOB: [birth year] DOA: 05/15/2021  PCP: Laura Squibb, MD  Admit date: 05/15/2021 Discharge date: 05/18/2021  Admitted From: Home Disposition:  SNF  Recommendations for Outpatient Follow-up:  Follow up with PCP in 1-2 weeks Please obtain BMP/CBC in one week Keep patient on 2L nasal canula, wean off for saturation >92%   Equipment/Devices: 2L [place]  Discharge Condition: Stable CODE STATUS: FULL Diet recommendation: Heart Healthy   Brief/Interim Summary:  75 y.o. female with medical history significant for COPD, memory loss, hypothyroidism. Patient was brought to the Ed with reports of a fall today.  Patient was in the kitchen, standing when suddenly her knees gave way, and she was on the floor.  She was conscious the whole time, she is sure she did not hit her head, and she landed on her buttock.  She denies prior dizziness, no chest pain, no difficulty breathing, and reports chronic unchanged cough.  No vomiting, no loose stools and has had good oral intake.   ED Course: Stable vitals.  Leukocytosis of 15.3.  X-ray shows comminuted intratrochanteric right femoral fracture.   05/16/21 Seen by Dr. Aline Brochure this morning with anticipated surgical repair later today.  Discharge Diagnoses:  Close right hip fracture--comminuted intertrochanteric femur fx -appreciate ortho>>ASA 325 mg for DVT prophylaxis post op -continue PRN analgesics -6/14/2200R-IM nail -Follow orthopedic service recommendations postoperatively. -PT>>SNF -remove staples on POD#14 per ortho  Acute respiratory failure with hypoxia -due to COPD exacerbation -CXR--no infiltrates -87% on RA -stable on 2L-->94-96%    Memory loss -Continue supportive care and constant reorientation. -Continue Aricept  and Namenda -Patient at high risk for developing hospital-acquired delirium   hypothyroidism -Continue synthroid   COPD Exacerbation -Mild expiratory  wheezing appreciated. -given solumedrol -d/c with prednisone 50 mg daily x 5 more days Continue as needed bronchodilators -Patient counseled to quit smoking. -Recommended use of flutter valve and incentive spirometer after surgery.   tobacco abuse -smoking cessation counseling provided.   depression/anxiety -Stable mood -Continue Celexa and as needed Xanax.   Prolonged QT interval -Stable -No electrolytes abnormalities appreciated -Continue monitoring on telemetry -minimize the use of medications that can prolong QT.     Discharge Instructions   Allergies as of 05/18/2021       Reactions   Penicillins Rash        Medication List     TAKE these medications    albuterol 108 (90 Base) MCG/ACT inhaler Commonly known as: VENTOLIN HFA SMARTSIG:1-2 Puff(s) Via Inhaler Every 4-6 Hours PRN   ALPRAZolam 0.5 MG tablet Commonly known as: XANAX Take 1 tablet (0.5 mg total) by mouth every morning.   aspirin 325 MG EC tablet Take 1 tablet (325 mg total) by mouth daily with breakfast. Start taking on: May 19, 2021   atorvastatin 10 MG tablet Commonly known as: LIPITOR Take 10 mg by mouth daily.   citalopram 10 MG tablet Commonly known as: CELEXA Take 10 mg by mouth daily.   donepezil 5 MG tablet Commonly known as: ARICEPT Take 5 mg by mouth at bedtime.   FIBER-CAPS PO Take by mouth daily.   Fish Oil 1200 MG Cpdr Take 2 capsules by mouth daily.   HYDROcodone-acetaminophen 5-325 MG tablet Commonly known as: NORCO/VICODIN Take 1 tablet by mouth every 4 (four) hours as needed for moderate pain (pain score 4-6).   ipratropium-albuterol 0.5-2.5 (3) MG/3ML Soln Commonly known as: DUONEB Take 3 mLs by nebulization every 4 (four) hours as needed.  levothyroxine 75 MCG tablet Commonly known as: SYNTHROID Take 1 tablet (75 mcg total) by mouth daily before breakfast.   memantine 10 MG tablet Commonly known as: NAMENDA TAKE (1) TABLET BY MOUTH TWICE DAILY.    multivitamin with minerals tablet Take 1 tablet by mouth daily.   predniSONE 50 MG tablet Commonly known as: DELTASONE Take 1 tablet (50 mg total) by mouth daily with breakfast. X 5 days Start taking on: May 19, 2021   Trelegy Ellipta 100-62.5-25 MCG/INH Aepb Generic drug: Fluticasone-Umeclidin-Vilant Inhale 1 puff into the lungs daily.   umeclidinium-vilanterol 62.5-25 MCG/INH Aepb Commonly known as: ANORO ELLIPTA Inhale 1 puff into the lungs at bedtime.   Vitamin D3 25 MCG (1000 UT) Caps Take by mouth daily.        Contact information for after-discharge care     [facility] .   Service: Skilled Nursing Contact information: [place] [address] [phone]                    Allergies  Allergen Reactions   Penicillins Rash    Consultations: Ortho--[place]   Procedures/Studies: DG Chest 1 View  Result Date: 05/15/2021 CLINICAL DATA:  Right hip pain following fall, initial encounter EXAM: CHEST  1 VIEW COMPARISON:  06/20/2020 FINDINGS: Cardiac shadow is within normal limits. The lungs are well aerated bilaterally. No focal infiltrate or sizable effusion is seen. Bony abnormality is noted IMPRESSION: No active disease. Electronically Signed   By: Inez Catalina M.D.   On: 05/15/2021 16:21   DG Pelvis 1-2 Views  Result Date: 05/16/2021 CLINICAL DATA:  Post IM nail RIGHT femur EXAM: PELVIS - 1-2 VIEW COMPARISON:  Portable exam 1356 hours compared to earlier intraoperative images of 05/16/2021 FINDINGS: IM nail with compression screw at RIGHT femur across a reduced intertrochanteric fracture. Old IM nail with locking screw at proximal LEFT femur. Bones demineralized. Hip and SI joint spaces preserved. No additional fracture or dislocation seen. Atherosclerotic calcifications aorta and iliac arteries. IMPRESSION: Post nailing of intertrochanteric fracture RIGHT femur. Aortic  Atherosclerosis (ICD10-I70.0). Electronically Signed   By: Lavonia Dana M.D.   On: 05/16/2021 14:19   DG Pelvis 1-2 Views  Result Date: 05/15/2021 CLINICAL DATA:  Recent fall with pelvic pain on the right, initial encounter EXAM: PELVIS - 1 VIEW COMPARISON:  None. FINDINGS: Comminuted right intratrochanteric fracture is noted with mild impaction at the fracture site. Medullary rod is noted within the proximal left femur. Pelvic ring appears intact. No soft tissue abnormality is noted. IMPRESSION: Right intratrochanteric femoral fracture. Electronically Signed   By: Inez Catalina M.D.   On: 05/15/2021 16:20   DG HIP OPERATIVE UNILAT W OR W/O PELVIS RIGHT  Result Date: 05/16/2021 CLINICAL DATA:  RIGHT hip fracture, ORIF EXAM: OPERATIVE RIGHT HIP (WITH PELVIS IF PERFORMED) 12 VIEWS TECHNIQUE: Fluoroscopic spot image(s) were submitted for interpretation post-operatively. COMPARISON:  05/15/2021 FLUOROSCOPY TIME:  3 minutes 19 seconds FINDINGS: Images demonstrate placement of an IM nail with a compression screw across a intertrochanteric fracture of the RIGHT femur. Bones demineralized. No dislocation or additional fracture identified. IMPRESSION: Post ORIF of intertrochanteric fracture RIGHT femur Electronically Signed   By: Lavonia Dana M.D.   On: 05/16/2021 13:39   DG FEMUR, MIN 2 VIEWS RIGHT  Result Date: 05/16/2021 CLINICAL DATA:  Post RIGHT femoral nail placement EXAM: RIGHT FEMUR 2 VIEWS COMPARISON:  Portable exam 1357 hours compared intraoperative images of 05/16/2021  FINDINGS: IM nail with compression screw at proximal RIGHT femur across a reduced intertrochanteric fracture. Locking screw at mid femur. No additional fracture or dislocation identified. Joint space narrowing RIGHT knee. RIGHT hip joint space preserved. IMPRESSION: Post ORIF intertrochanteric fracture RIGHT femur. No acute complications. Electronically Signed   By: Lavonia Dana M.D.   On: 05/16/2021 14:20   DG FEMUR, MIN 2 VIEWS  RIGHT  Result Date: 05/15/2021 CLINICAL DATA:  Recent with right hip pain, initial encounter EXAM: RIGHT FEMUR 2 VIEWS COMPARISON:  None. FINDINGS: Comminuted intratrochanteric fracture of the proximal right femur is noted. No dislocation is seen. Distal femur is within normal limits. No gross soft tissue abnormality is noted. IMPRESSION: Comminuted intratrochanteric right femoral fracture. Electronically Signed   By: Inez Catalina M.D.   On: 05/15/2021 16:20        Discharge Exam: Vitals:   05/18/21 0339 05/18/21 0723  BP: 112/77   Pulse: 96   Resp: 18   Temp: 97.6 F (36.4 C)   SpO2: 97% 95%   Vitals:   05/17/21 1929 05/17/21 2032 05/18/21 0339 05/18/21 0723  BP:  109/65 112/77   Pulse:  95 96   Resp:  20 18   Temp:  98.4 F (36.9 C) 97.6 F (36.4 C)   TempSrc:  Oral    SpO2: 97% 95% 97% 95%  Weight:      Height:        General: Pt is alert, awake, not in acute distress Cardiovascular: RRR, S1/S2 +, no rubs, no gallops Respiratory: bilateral rales.  Bibasilar wheeze Abdominal: Soft, NT, ND, bowel sounds + Extremities: no edema, no cyanosis   The results of significant diagnostics from this hospitalization (including imaging, microbiology, ancillary and laboratory) are listed below for reference.    Significant Diagnostic Studies: DG Chest 1 View  Result Date: 05/15/2021 CLINICAL DATA:  Right hip pain following fall, initial encounter EXAM: CHEST  1 VIEW COMPARISON:  06/20/2020 FINDINGS: Cardiac shadow is within normal limits. The lungs are well aerated bilaterally. No focal infiltrate or sizable effusion is seen. Bony abnormality is noted IMPRESSION: No active disease. Electronically Signed   By: Inez Catalina M.D.   On: 05/15/2021 16:21   DG Pelvis 1-2 Views  Result Date: 05/16/2021 CLINICAL DATA:  Post IM nail RIGHT femur EXAM: PELVIS - 1-2 VIEW COMPARISON:  Portable exam 1356 hours compared to earlier intraoperative images of 05/16/2021 FINDINGS: IM nail with  compression screw at RIGHT femur across a reduced intertrochanteric fracture. Old IM nail with locking screw at proximal LEFT femur. Bones demineralized. Hip and SI joint spaces preserved. No additional fracture or dislocation seen. Atherosclerotic calcifications aorta and iliac arteries. IMPRESSION: Post nailing of intertrochanteric fracture RIGHT femur. Aortic Atherosclerosis (ICD10-I70.0). Electronically Signed   By: Lavonia Dana M.D.   On: 05/16/2021 14:19   DG Pelvis 1-2 Views  Result Date: 05/15/2021 CLINICAL DATA:  Recent fall with pelvic pain on the right, initial encounter EXAM: PELVIS - 1 VIEW COMPARISON:  None. FINDINGS: Comminuted right intratrochanteric fracture is noted with mild impaction at the fracture site. Medullary rod is noted within the proximal left femur. Pelvic ring appears intact. No soft tissue abnormality is noted. IMPRESSION: Right intratrochanteric femoral fracture. Electronically Signed   By: Inez Catalina M.D.   On: 05/15/2021 16:20   DG HIP OPERATIVE UNILAT W OR W/O PELVIS RIGHT  Result Date: 05/16/2021 CLINICAL DATA:  RIGHT hip fracture, ORIF EXAM: OPERATIVE RIGHT HIP (WITH PELVIS IF PERFORMED) 12 VIEWS  TECHNIQUE: Fluoroscopic spot image(s) were submitted for interpretation post-operatively. COMPARISON:  05/15/2021 FLUOROSCOPY TIME:  3 minutes 19 seconds FINDINGS: Images demonstrate placement of an IM nail with a compression screw across a intertrochanteric fracture of the RIGHT femur. Bones demineralized. No dislocation or additional fracture identified. IMPRESSION: Post ORIF of intertrochanteric fracture RIGHT femur Electronically Signed   By: Lavonia Dana M.D.   On: 05/16/2021 13:39   DG FEMUR, MIN 2 VIEWS RIGHT  Result Date: 05/16/2021 CLINICAL DATA:  Post RIGHT femoral nail placement EXAM: RIGHT FEMUR 2 VIEWS COMPARISON:  Portable exam 1357 hours compared intraoperative images of 05/16/2021 FINDINGS: IM nail with compression screw at proximal RIGHT femur across a  reduced intertrochanteric fracture. Locking screw at mid femur. No additional fracture or dislocation identified. Joint space narrowing RIGHT knee. RIGHT hip joint space preserved. IMPRESSION: Post ORIF intertrochanteric fracture RIGHT femur. No acute complications. Electronically Signed   By: Lavonia Dana M.D.   On: 05/16/2021 14:20   DG FEMUR, MIN 2 VIEWS RIGHT  Result Date: 05/15/2021 CLINICAL DATA:  Recent with right hip pain, initial encounter EXAM: RIGHT FEMUR 2 VIEWS COMPARISON:  None. FINDINGS: Comminuted intratrochanteric fracture of the proximal right femur is noted. No dislocation is seen. Distal femur is within normal limits. No gross soft tissue abnormality is noted. IMPRESSION: Comminuted intratrochanteric right femoral fracture. Electronically Signed   By: Inez Catalina M.D.   On: 05/15/2021 16:20    Microbiology: Recent Results (from the past 240 hour(s))  Resp Panel by RT-PCR (Flu A&B, Covid) Nasopharyngeal Swab     Status: None   Collection Time: 05/15/21  3:12 PM   Specimen: Nasopharyngeal Swab; Nasopharyngeal(NP) swabs in vial transport medium  Result Value Ref Range Status   SARS Coronavirus 2 by RT PCR NEGATIVE NEGATIVE Final    Comment: (NOTE) SARS-CoV-2 target nucleic acids are NOT DETECTED.  The SARS-CoV-2 RNA is generally detectable in upper respiratory specimens during the acute phase of infection. The lowest concentration of SARS-CoV-2 viral copies this assay can detect is 138 copies/mL. A negative result does not preclude SARS-Cov-2 infection and should not be used as the sole basis for treatment or other patient management decisions. A negative result may occur with  improper specimen collection/handling, submission of specimen other than nasopharyngeal swab, presence of viral mutation(s) within the areas targeted by this assay, and inadequate number of viral copies(<138 copies/mL). A negative result must be combined with clinical observations, patient history,  and epidemiological information. The expected result is Negative.  Fact Sheet for Patients:  EntrepreneurPulse.com.au  Fact Sheet for Healthcare Providers:  IncredibleEmployment.be  This test is no t yet approved or cleared by the [place] FDA and  has been authorized for detection and/or diagnosis of SARS-CoV-2 by FDA under an Emergency Use Authorization (EUA). This EUA will remain  in effect (meaning this test can be used) for the duration of the COVID-19 declaration under Section 564(b)(1) of the Act, 21 U.S.C.section 360bbb-3(b)(1), unless the authorization is terminated  or revoked sooner.       Influenza A by PCR NEGATIVE NEGATIVE Final   Influenza B by PCR NEGATIVE NEGATIVE Final    Comment: (NOTE) The Xpert Xpress SARS-CoV-2/FLU/RSV plus assay is intended as an aid in the diagnosis of influenza from Nasopharyngeal swab specimens and should not be used as a sole basis for treatment. Nasal washings and aspirates are unacceptable for Xpert Xpress SARS-CoV-2/FLU/RSV testing.  Fact Sheet for Patients: EntrepreneurPulse.com.au  Fact Sheet for Healthcare Providers: IncredibleEmployment.be  This  test is not yet approved or cleared by the [place] and has been authorized for detection and/or diagnosis of SARS-CoV-2 by FDA under an Emergency Use Authorization (EUA). This EUA will remain in effect (meaning this test can be used) for the duration of the COVID-19 declaration under Section 564(b)(1) of the Act, 21 U.S.C. section 360bbb-3(b)(1), unless the authorization is terminated or revoked.  Performed at [facility], [address] [place], [place] [address]   Surgical pcr screen     Status: None   Collection Time: 05/16/21  7:05 AM  Result Value Ref Range Status   MRSA, PCR NEGATIVE NEGATIVE Final   Staphylococcus aureus NEGATIVE NEGATIVE Final    Comment: (NOTE) The Xpert SA Assay  (FDA approved for NASAL specimens in patients 66 years of age and older), is one component of a comprehensive surveillance program. It is not intended to diagnose infection nor to guide or monitor treatment. Performed at [facility], [address] [place], [place] [address]      Labs: Basic Metabolic Panel: Recent Labs  Lab 05/15/21 1511 05/15/21 1512 05/16/21 0421  NA 134*  --  135  K 4.0  --  3.9  CL 103  --  104  CO2 23  --  25  GLUCOSE 153*  --  126*  BUN 17  --  19  CREATININE 0.71  --  0.71  CALCIUM 8.4*  --  8.0*  MG  --  2.1  --    Liver Function Tests: No results for input(s): AST, ALT, ALKPHOS, BILITOT, PROT, ALBUMIN in the last 168 hours. No results for input(s): LIPASE, AMYLASE in the last 168 hours. No results for input(s): AMMONIA in the last 168 hours. CBC: Recent Labs  Lab 05/15/21 1511 05/16/21 0421 05/17/21 0437 05/18/21 0553  WBC 15.3* 8.6 10.2 9.5  NEUTROABS 13.7*  --   --   --   HGB 12.8 10.9* 9.1* 8.9*  HCT 38.5 33.8* 28.3* 27.8*  MCV 95.5 99.1 100.4* 100.0  PLT 310 274 236 230   Cardiac Enzymes: No results for input(s): CKTOTAL, CKMB, CKMBINDEX, TROPONINI in the last 168 hours. BNP: Invalid input(s): POCBNP CBG: No results for input(s): GLUCAP in the last 168 hours.  Time coordinating discharge:  36 minutes  Signed:  Orson Eva, DO [facility] Pager: [phone] 05/18/2021, 11:51 AM

## 2021-05-18 NOTE — Progress Notes (Addendum)
Physical Therapy Treatment Patient Details Name: Laura Foster MRN: [id number] DOB: Oct 09, 1946 Today's Date: 05/18/2021    History of Present Illness Laura Foster is a 75 y.o. female, s/p ORIF Right hip fracture on 05/16/21 with medical history significant for COPD, memory problems, hypothyroidism.  Patient was brought to the Ed with reports of a fall today.  Patient was in the kitchen, standing when suddenly her knees gave way, and she was on the floor.  She was conscious the whole time, she is sure she did not hit her head, and she landed on her buttock.  She denies prior dizziness, no chest pain, no difficulty breathing, and reports chronic unchanged cough.  No vomiting, no loose stools and has had good oral intake.    PT Comments    Patient presents asleep due to reports of not sleeping well last night. Patient received pain medication prior to treatment. Patient require min assist to for bed mobility and fair balance seated at EOB. Patient was able to complete therapeutic exercise including: heel raises, calf raises, long arch quads, seated marching. Patient tolerated exercise well and required mid/mod assist for sit to stand transfer. Patient was able to ambulate 10 feet at bed side with min assist/guard forwards/backwards at the bed side. Patient on room air with SpO2 dropping to 86% and put back on 2 LPM O2.  Patient demonstrated good return to chair after therapy. Patient will benefit from continued physical therapy in hospital and recommended venue below to increase strength, balance, endurance for safe ADLs and gait.   Follow Up Recommendations  SNF     Equipment Recommendations  None recommended by PT    Recommendations for Other Services       Precautions / Restrictions Precautions Precautions: Fall Restrictions Weight Bearing Restrictions: Yes RLE Weight Bearing: Weight bearing as tolerated    Mobility  Bed Mobility Overal bed mobility: Needs Assistance Bed  Mobility: Supine to Sit     Supine to sit: Min assist     General bed mobility comments: slow labored movement with increase time Patient Response: Cooperative  Transfers Overall transfer level: Needs assistance Equipment used: Rolling walker (2 wheeled) Transfers: Sit to/from [facility] Sit to Stand: Min assist Stand pivot transfers: Min assist       General transfer comment: increased time, labored movement, has difficulty moving RLE due to increased pain  Ambulation/Gait Ambulation/Gait assistance: Min assist;Mod assist Gait Distance (Feet): 10 Feet Assistive device: Rolling walker (2 wheeled) Gait Pattern/deviations: Decreased step length - right;Decreased step length - left;Decreased stride length;Decreased stance time - right;Antalgic;Shuffle Gait velocity: decreased   General Gait Details: limited to a few slow labored side steps and forward and backwards at bed side due to increased R hip pain   Stairs             Wheelchair Mobility    Modified Rankin (Stroke Patients Only)       Balance Overall balance assessment: Needs assistance Sitting-balance support: Feet supported;No upper extremity supported Sitting balance-Leahy Scale: Fair Sitting balance - Comments: seated at EOB   Standing balance support: During functional activity;Bilateral upper extremity supported Standing balance-Leahy Scale: Poor Standing balance comment: fair/poor using RW                            Cognition Arousal/Alertness: Awake/alert Behavior During Therapy: WFL for tasks assessed/performed Overall Cognitive Status: Within Functional Limits for tasks assessed  Exercises General Exercises - Lower Extremity Long Arc Quad: Seated;AROM;Strengthening;Both;10 reps Hip Flexion/Marching: Seated;AROM;Strengthening;Both;10 reps Toe Raises: Seated;AROM;Strengthening;Both;10 reps Heel Raises:  Seated;AROM;Strengthening;Both;10 reps    General Comments        Pertinent Vitals/Pain Pain Assessment: Faces Faces Pain Scale: Hurts even more Pain Location: right hip with movement Pain Descriptors / Indicators: Sore;Grimacing;Guarding Pain Intervention(s): Limited activity within patient's tolerance;Monitored during session;Repositioned;Premedicated before session    Home Living                      Prior Function            PT Goals (current goals can now be found in the care plan section) Acute Rehab PT Goals Patient Stated Goal: return home after rehab PT Goal Formulation: With patient/family Time For Goal Achievement: 05/31/21 Potential to Achieve Goals: Good Progress towards PT goals: Progressing toward goals    Frequency    Min 3X/week      PT Plan Current plan remains appropriate    Co-evaluation              AM-PAC PT "6 Clicks" Mobility   Outcome Measure  Help needed turning from your back to your side while in a flat bed without using bedrails?: A Little Help needed moving from lying on your back to sitting on the side of a flat bed without using bedrails?: A Little Help needed moving to and from a bed to a chair (including a wheelchair)?: A Lot Help needed standing up from a chair using your arms (e.g., wheelchair or bedside chair)?: A Lot Help needed to walk in hospital room?: A Lot Help needed climbing 3-5 steps with a railing? : Total 6 Click Score: 13    End of Session Equipment Utilized During Treatment: Oxygen Activity Tolerance: Patient tolerated treatment well;Patient limited by fatigue Patient left: in chair;with call bell/phone within reach;with family/visitor present Nurse Communication: Mobility status PT Visit Diagnosis: Unsteadiness on feet (R26.81);Other abnormalities of gait and mobility (R26.89);Muscle weakness (generalized) (M62.81)     Time: 5681-2751 PT Time Calculation (min) (ACUTE ONLY): 20 min  Charges:   $Therapeutic Exercise: 8-22 mins $Therapeutic Activity: 8-22 mins                    12:24 PM, 05/18/21 Jeneen Rinks Cousler SPT  12:24 PM, 05/18/21 Lonell Grandchild, MPT Physical Therapist with [facility] 336 [phone] office [phone] mobile phone

## 2021-05-18 NOTE — Care Management Important Message (Signed)
Important Message  Patient Details  Name: Laura Foster MRN: [id number] Date of Birth: [birth year]   Medicare Important Message Given:  Yes     Tommy Medal 05/18/2021, 11:57 AM

## 2021-05-21 ENCOUNTER — Other Ambulatory Visit: Payer: Self-pay

## 2021-05-21 ENCOUNTER — Emergency Department (HOSPITAL_COMMUNITY): Payer: Medicare Other

## 2021-05-21 ENCOUNTER — Inpatient Hospital Stay (HOSPITAL_COMMUNITY)
Admission: EM | Admit: 2021-05-21 | Discharge: 2021-05-24 | DRG: 378 | Disposition: A | Discharge status: Skilled Nursing Facility | Payer: Medicare Other | Attending: Internal Medicine | Admitting: Internal Medicine

## 2021-05-21 ENCOUNTER — Encounter (HOSPITAL_COMMUNITY): Payer: Self-pay | Admitting: Emergency Medicine

## 2021-05-21 DIAGNOSIS — M6281 Muscle weakness (generalized): Secondary | ICD-10-CM | POA: Diagnosis not present

## 2021-05-21 DIAGNOSIS — E039 Hypothyroidism, unspecified: Secondary | ICD-10-CM | POA: Diagnosis not present

## 2021-05-21 DIAGNOSIS — Z79890 Hormone replacement therapy (postmenopausal): Secondary | ICD-10-CM | POA: Diagnosis not present

## 2021-05-21 DIAGNOSIS — Z9981 Dependence on supplemental oxygen: Secondary | ICD-10-CM

## 2021-05-21 DIAGNOSIS — Z743 Need for continuous supervision: Secondary | ICD-10-CM | POA: Diagnosis not present

## 2021-05-21 DIAGNOSIS — E890 Postprocedural hypothyroidism: Secondary | ICD-10-CM | POA: Diagnosis not present

## 2021-05-21 DIAGNOSIS — J9601 Acute respiratory failure with hypoxia: Secondary | ICD-10-CM | POA: Diagnosis not present

## 2021-05-21 DIAGNOSIS — Z833 Family history of diabetes mellitus: Secondary | ICD-10-CM | POA: Diagnosis not present

## 2021-05-21 DIAGNOSIS — W19XXXD Unspecified fall, subsequent encounter: Secondary | ICD-10-CM | POA: Diagnosis present

## 2021-05-21 DIAGNOSIS — J449 Chronic obstructive pulmonary disease, unspecified: Secondary | ICD-10-CM | POA: Diagnosis present

## 2021-05-21 DIAGNOSIS — Z820 Family history of epilepsy and other diseases of the nervous system: Secondary | ICD-10-CM

## 2021-05-21 DIAGNOSIS — D62 Acute posthemorrhagic anemia: Secondary | ICD-10-CM

## 2021-05-21 DIAGNOSIS — E7800 Pure hypercholesterolemia, unspecified: Secondary | ICD-10-CM | POA: Diagnosis present

## 2021-05-21 DIAGNOSIS — R279 Unspecified lack of coordination: Secondary | ICD-10-CM | POA: Diagnosis not present

## 2021-05-21 DIAGNOSIS — R412 Retrograde amnesia: Secondary | ICD-10-CM | POA: Diagnosis not present

## 2021-05-21 DIAGNOSIS — K2970 Gastritis, unspecified, without bleeding: Secondary | ICD-10-CM | POA: Diagnosis present

## 2021-05-21 DIAGNOSIS — Z880 Allergy status to penicillin: Secondary | ICD-10-CM | POA: Diagnosis not present

## 2021-05-21 DIAGNOSIS — S72141D Displaced intertrochanteric fracture of right femur, subsequent encounter for closed fracture with routine healing: Secondary | ICD-10-CM | POA: Diagnosis not present

## 2021-05-21 DIAGNOSIS — R413 Other amnesia: Secondary | ICD-10-CM | POA: Diagnosis not present

## 2021-05-21 DIAGNOSIS — F17210 Nicotine dependence, cigarettes, uncomplicated: Secondary | ICD-10-CM | POA: Diagnosis present

## 2021-05-21 DIAGNOSIS — J441 Chronic obstructive pulmonary disease with (acute) exacerbation: Secondary | ICD-10-CM | POA: Diagnosis present

## 2021-05-21 DIAGNOSIS — K922 Gastrointestinal hemorrhage, unspecified: Secondary | ICD-10-CM | POA: Diagnosis present

## 2021-05-21 DIAGNOSIS — I700 Atherosclerosis of aorta: Secondary | ICD-10-CM | POA: Diagnosis present

## 2021-05-21 DIAGNOSIS — T39395A Adverse effect of other nonsteroidal anti-inflammatory drugs [NSAID], initial encounter: Secondary | ICD-10-CM | POA: Diagnosis present

## 2021-05-21 DIAGNOSIS — R410 Disorientation, unspecified: Secondary | ICD-10-CM | POA: Diagnosis not present

## 2021-05-21 DIAGNOSIS — K269 Duodenal ulcer, unspecified as acute or chronic, without hemorrhage or perforation: Secondary | ICD-10-CM | POA: Diagnosis not present

## 2021-05-21 DIAGNOSIS — K259 Gastric ulcer, unspecified as acute or chronic, without hemorrhage or perforation: Secondary | ICD-10-CM | POA: Diagnosis not present

## 2021-05-21 DIAGNOSIS — F419 Anxiety disorder, unspecified: Secondary | ICD-10-CM | POA: Diagnosis present

## 2021-05-21 DIAGNOSIS — R2689 Other abnormalities of gait and mobility: Secondary | ICD-10-CM | POA: Diagnosis not present

## 2021-05-21 DIAGNOSIS — S72001D Fracture of unspecified part of neck of right femur, subsequent encounter for closed fracture with routine healing: Secondary | ICD-10-CM | POA: Diagnosis present

## 2021-05-21 DIAGNOSIS — Z7952 Long term (current) use of systemic steroids: Secondary | ICD-10-CM | POA: Diagnosis not present

## 2021-05-21 DIAGNOSIS — F32A Depression, unspecified: Secondary | ICD-10-CM | POA: Diagnosis not present

## 2021-05-21 DIAGNOSIS — Z807 Family history of other malignant neoplasms of lymphoid, hematopoietic and related tissues: Secondary | ICD-10-CM

## 2021-05-21 DIAGNOSIS — Z7982 Long term (current) use of aspirin: Secondary | ICD-10-CM

## 2021-05-21 DIAGNOSIS — K264 Chronic or unspecified duodenal ulcer with hemorrhage: Secondary | ICD-10-CM | POA: Diagnosis not present

## 2021-05-21 DIAGNOSIS — E785 Hyperlipidemia, unspecified: Secondary | ICD-10-CM | POA: Diagnosis not present

## 2021-05-21 DIAGNOSIS — R5381 Other malaise: Secondary | ICD-10-CM | POA: Diagnosis not present

## 2021-05-21 DIAGNOSIS — F0390 Unspecified dementia without behavioral disturbance: Secondary | ICD-10-CM | POA: Diagnosis present

## 2021-05-21 DIAGNOSIS — Z79899 Other long term (current) drug therapy: Secondary | ICD-10-CM | POA: Diagnosis not present

## 2021-05-21 DIAGNOSIS — I4581 Long QT syndrome: Secondary | ICD-10-CM | POA: Diagnosis not present

## 2021-05-21 DIAGNOSIS — Z720 Tobacco use: Secondary | ICD-10-CM | POA: Diagnosis not present

## 2021-05-21 DIAGNOSIS — Z20822 Contact with and (suspected) exposure to covid-19: Secondary | ICD-10-CM | POA: Diagnosis present

## 2021-05-21 DIAGNOSIS — R0602 Shortness of breath: Secondary | ICD-10-CM | POA: Diagnosis not present

## 2021-05-21 DIAGNOSIS — R109 Unspecified abdominal pain: Secondary | ICD-10-CM | POA: Diagnosis not present

## 2021-05-21 DIAGNOSIS — R2681 Unsteadiness on feet: Secondary | ICD-10-CM | POA: Diagnosis not present

## 2021-05-21 DIAGNOSIS — Z8249 Family history of ischemic heart disease and other diseases of the circulatory system: Secondary | ICD-10-CM

## 2021-05-21 DIAGNOSIS — R58 Hemorrhage, not elsewhere classified: Secondary | ICD-10-CM | POA: Diagnosis not present

## 2021-05-21 DIAGNOSIS — Z7401 Bed confinement status: Secondary | ICD-10-CM | POA: Diagnosis not present

## 2021-05-21 LAB — COMPREHENSIVE METABOLIC PANEL
ALT: 19 U/L (ref 0–44)
AST: 16 U/L (ref 15–41)
Albumin: 2.6 g/dL — ABNORMAL LOW (ref 3.5–5.0)
Alkaline Phosphatase: 57 U/L (ref 38–126)
Anion gap: 9 (ref 5–15)
BUN: 40 mg/dL — ABNORMAL HIGH (ref 8–23)
CO2: 27 mmol/L (ref 22–32)
Calcium: 8 mg/dL — ABNORMAL LOW (ref 8.9–10.3)
Chloride: 96 mmol/L — ABNORMAL LOW (ref 98–111)
Creatinine, Ser: 0.61 mg/dL (ref 0.44–1.00)
GFR, Estimated: >60 mL/min (ref >60)
Glucose, Bld: 144 mg/dL — ABNORMAL HIGH (ref 70–99)
Potassium: 4 mmol/L (ref 3.5–5.1)
Sodium: 132 mmol/L — ABNORMAL LOW (ref 135–145)
Total Bilirubin: 0.7 mg/dL (ref 0.3–1.2)
Total Protein: 5.5 g/dL — ABNORMAL LOW (ref 6.5–8.1)

## 2021-05-21 LAB — PREPARE RBC (CROSSMATCH)

## 2021-05-21 LAB — CBC
HCT: 22.3 % — ABNORMAL LOW (ref 36.0–46.0)
Hemoglobin: 7.3 g/dL — ABNORMAL LOW (ref 12.0–15.0)
MCH: 32 pg (ref 26.0–34.0)
MCHC: 32.7 g/dL (ref 30.0–36.0)
MCV: 97.8 fL (ref 80.0–100.0)
Platelets: 317 10*3/uL (ref 150–400)
RBC: 2.28 MIL/uL — ABNORMAL LOW (ref 3.87–5.11)
RDW: 13.4 % (ref 11.5–15.5)
WBC: 9.3 10*3/uL (ref 4.0–10.5)
nRBC: 0.5 % — ABNORMAL HIGH (ref 0.0–0.2)

## 2021-05-21 LAB — TROPONIN I (HIGH SENSITIVITY)
Troponin I (High Sensitivity): 4 ng/L (ref <18)
Troponin I (High Sensitivity): 3 ng/L (ref <18)

## 2021-05-21 LAB — RESP PANEL BY RT-PCR (FLU A&B, COVID) ARPGX2
Influenza A by PCR: NEGATIVE
Influenza B by PCR: NEGATIVE
SARS Coronavirus 2 by RT PCR: NEGATIVE

## 2021-05-21 LAB — BRAIN NATRIURETIC PEPTIDE: B Natriuretic Peptide: 52 pg/mL (ref 0.0–100.0)

## 2021-05-21 LAB — PROTIME-INR
INR: 1.1 (ref 0.8–1.2)
Prothrombin Time: 13.8 seconds (ref 11.4–15.2)

## 2021-05-21 MED ORDER — MELATONIN 5 MG PO TABS
5.0000 mg | ORAL_TABLET | Freq: Every day | ORAL | Status: DC
  Filled 2021-05-21 (×3): qty 1

## 2021-05-21 MED ORDER — SODIUM CHLORIDE 0.9% FLUSH
3.0000 mL | Freq: Two times a day (BID) | INTRAVENOUS | Status: DC
  Administered 2021-05-21 – 2021-05-22 (×2): 3 mL via INTRAVENOUS

## 2021-05-21 MED ORDER — TIOTROPIUM BROMIDE MONOHYDRATE 2.5 MCG/ACT IN AERS
2.0000 | INHALATION_SPRAY | Freq: Every day | RESPIRATORY_TRACT | Status: DC
  Filled 2021-05-21 (×4): qty 1

## 2021-05-21 MED ORDER — LEVOTHYROXINE SODIUM 75 MCG PO TABS
75.0000 ug | ORAL_TABLET | Freq: Every day | ORAL | Status: DC
  Administered 2021-05-22 – 2021-05-24 (×3): 75 ug via ORAL
  Filled 2021-05-21 (×3): qty 1

## 2021-05-21 MED ORDER — SODIUM CHLORIDE 0.9% FLUSH: 3.0000 mL | INTRAVENOUS | Status: DC | PRN

## 2021-05-21 MED ORDER — ACETAMINOPHEN 650 MG RE SUPP: 650.0000 mg | Freq: Four times a day (QID) | RECTAL | Status: DC | PRN

## 2021-05-21 MED ORDER — ALPRAZOLAM 0.5 MG PO TABS
0.5000 mg | ORAL_TABLET | Freq: Every morning | ORAL | Status: DC
  Administered 2021-05-21: 0.5 mg via ORAL
  Filled 2021-05-21: qty 1

## 2021-05-21 MED ORDER — ALBUTEROL SULFATE HFA 108 (90 BASE) MCG/ACT IN AERS
6.0000 | INHALATION_SPRAY | Freq: Once | RESPIRATORY_TRACT | Status: AC
  Administered 2021-05-21: 6 via RESPIRATORY_TRACT
  Filled 2021-05-21: qty 6.7

## 2021-05-21 MED ORDER — UMECLIDINIUM-VILANTEROL 62.5-25 MCG/INH IN AEPB: 1.0000 | INHALATION_SPRAY | Freq: Every day | RESPIRATORY_TRACT | Status: DC

## 2021-05-21 MED ORDER — SODIUM CHLORIDE 0.9 % IV BOLUS
1000.0000 mL | Freq: Once | INTRAVENOUS | Status: AC
  Administered 2021-05-21: 1000 mL via INTRAVENOUS

## 2021-05-21 MED ORDER — MULTI-VITAMIN/MINERALS PO TABS: 1.0000 | ORAL_TABLET | Freq: Every day | ORAL | Status: DC

## 2021-05-21 MED ORDER — SODIUM CHLORIDE 0.9% FLUSH
3.0000 mL | Freq: Two times a day (BID) | INTRAVENOUS | Status: DC
  Administered 2021-05-21 – 2021-05-24 (×4): 3 mL via INTRAVENOUS

## 2021-05-21 MED ORDER — ACETAMINOPHEN 325 MG PO TABS: 650.0000 mg | ORAL_TABLET | Freq: Four times a day (QID) | ORAL | Status: DC | PRN

## 2021-05-21 MED ORDER — ALPRAZOLAM 0.5 MG PO TABS
0.5000 mg | ORAL_TABLET | Freq: Two times a day (BID) | ORAL | Status: DC | PRN
  Administered 2021-05-21 – 2021-05-24 (×2): 0.5 mg via ORAL
  Filled 2021-05-21 (×2): qty 1

## 2021-05-21 MED ORDER — SODIUM CHLORIDE 0.9% FLUSH
3.0000 mL | Freq: Two times a day (BID) | INTRAVENOUS | Status: DC
  Administered 2021-05-22: 3 mL via INTRAVENOUS

## 2021-05-21 MED ORDER — UMECLIDINIUM BROMIDE 62.5 MCG/INH IN AEPB
1.0000 | INHALATION_SPRAY | Freq: Every day | RESPIRATORY_TRACT | Status: DC
  Administered 2021-05-23: 1 via RESPIRATORY_TRACT

## 2021-05-21 MED ORDER — SODIUM CHLORIDE 0.9 % IV SOLN: INTRAVENOUS | Status: DC

## 2021-05-21 MED ORDER — PANTOPRAZOLE INFUSION (NEW) - SIMPLE MED
8.0000 mg/h | INTRAVENOUS | Status: AC
  Administered 2021-05-21 – 2021-05-23 (×7): 8 mg/h via INTRAVENOUS
  Filled 2021-05-21 (×2): qty 100
  Filled 2021-05-21 (×3): qty 80
  Filled 2021-05-21: qty 100
  Filled 2021-05-21 (×2): qty 80
  Filled 2021-05-21 (×3): qty 100

## 2021-05-21 MED ORDER — SODIUM CHLORIDE 0.9% FLUSH
3.0000 mL | Freq: Two times a day (BID) | INTRAVENOUS | Status: DC
  Administered 2021-05-22 – 2021-05-24 (×3): 3 mL via INTRAVENOUS

## 2021-05-21 MED ORDER — FLUTICASONE FUROATE-VILANTEROL 100-25 MCG/INH IN AEPB
1.0000 | INHALATION_SPRAY | Freq: Every day | RESPIRATORY_TRACT | Status: DC
  Administered 2021-05-22 – 2021-05-23 (×2): 1 via RESPIRATORY_TRACT
  Filled 2021-05-21: qty 28

## 2021-05-21 MED ORDER — DEXTROSE-NACL 5-0.9 % IV SOLN: INTRAVENOUS | Status: DC

## 2021-05-21 MED ORDER — SODIUM CHLORIDE 0.9 % IV BOLUS: 500.0000 mL | Freq: Once | INTRAVENOUS | Status: DC

## 2021-05-21 MED ORDER — SODIUM CHLORIDE 0.9 % IV SOLN: 10.0000 mL/h | Freq: Once | INTRAVENOUS | Status: DC

## 2021-05-21 MED ORDER — BISACODYL 10 MG RE SUPP: 10.0000 mg | Freq: Every day | RECTAL | Status: DC | PRN

## 2021-05-21 MED ORDER — ALBUTEROL SULFATE (2.5 MG/3ML) 0.083% IN NEBU: 2.5000 mg | INHALATION_SOLUTION | RESPIRATORY_TRACT | Status: DC | PRN

## 2021-05-21 MED ORDER — PANTOPRAZOLE 80MG IVPB - SIMPLE MED
80.0000 mg | Freq: Once | INTRAVENOUS | Status: AC
  Administered 2021-05-21: 80 mg via INTRAVENOUS
  Filled 2021-05-21: qty 100

## 2021-05-21 MED ORDER — HYDROCODONE-ACETAMINOPHEN 5-325 MG PO TABS
1.0000 | ORAL_TABLET | ORAL | Status: DC | PRN
Start: 2021-05-21 — End: 2021-05-24
  Administered 2021-05-21 – 2021-05-23 (×4): 1 via ORAL
  Filled 2021-05-21 (×4): qty 1

## 2021-05-21 MED ORDER — ATORVASTATIN CALCIUM 10 MG PO TABS
10.0000 mg | ORAL_TABLET | Freq: Every day | ORAL | Status: DC
  Administered 2021-05-21 – 2021-05-24 (×4): 10 mg via ORAL
  Filled 2021-05-21 (×4): qty 1

## 2021-05-21 MED ORDER — SODIUM CHLORIDE 0.9 % IV SOLN: 250.0000 mL | INTRAVENOUS | Status: DC | PRN

## 2021-05-21 MED ORDER — POLYETHYLENE GLYCOL 3350 17 G PO PACK: 17.0000 g | PACK | Freq: Every day | ORAL | Status: DC | PRN

## 2021-05-21 MED ORDER — FLUTICASONE-UMECLIDIN-VILANT 100-62.5-25 MCG/INH IN AEPB: 1.0000 | INHALATION_SPRAY | Freq: Every day | RESPIRATORY_TRACT | Status: DC

## 2021-05-21 MED ORDER — METHYLPREDNISOLONE SODIUM SUCC 125 MG IJ SOLR
125.0000 mg | Freq: Once | INTRAMUSCULAR | Status: AC
  Administered 2021-05-21: 125 mg via INTRAVENOUS
  Filled 2021-05-21: qty 2

## 2021-05-21 MED ORDER — ADULT MULTIVITAMIN W/MINERALS CH
1.0000 | ORAL_TABLET | Freq: Every day | ORAL | Status: DC
  Administered 2021-05-21 – 2021-05-24 (×4): 1 via ORAL
  Filled 2021-05-21 (×4): qty 1
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
## 2021-05-21 NOTE — ED Triage Notes (Signed)
Pt to the ED from [place] via [place] with reports of blood in her stool that began today.
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
## 2021-05-21 NOTE — ED Notes (Signed)
bp decreased to 87/61.  Dr Roderic Palau made aware by secure chat.
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
## 2021-05-22 ENCOUNTER — Encounter (HOSPITAL_COMMUNITY)
Admission: EM | Disposition: A | Discharge status: Skilled Nursing Facility | Payer: Self-pay | Source: Home / Self Care | Attending: Family Medicine

## 2021-05-22 ENCOUNTER — Encounter (HOSPITAL_COMMUNITY): Payer: Self-pay | Admitting: Family Medicine

## 2021-05-22 ENCOUNTER — Observation Stay (HOSPITAL_COMMUNITY): Payer: Medicare Other | Admitting: Anesthesiology

## 2021-05-22 DIAGNOSIS — E7800 Pure hypercholesterolemia, unspecified: Secondary | ICD-10-CM | POA: Diagnosis present

## 2021-05-22 DIAGNOSIS — K264 Chronic or unspecified duodenal ulcer with hemorrhage: Secondary | ICD-10-CM | POA: Diagnosis present

## 2021-05-22 DIAGNOSIS — Z9981 Dependence on supplemental oxygen: Secondary | ICD-10-CM | POA: Diagnosis not present

## 2021-05-22 DIAGNOSIS — F0390 Unspecified dementia without behavioral disturbance: Secondary | ICD-10-CM | POA: Diagnosis present

## 2021-05-22 DIAGNOSIS — F32A Depression, unspecified: Secondary | ICD-10-CM | POA: Diagnosis present

## 2021-05-22 DIAGNOSIS — F17210 Nicotine dependence, cigarettes, uncomplicated: Secondary | ICD-10-CM | POA: Diagnosis present

## 2021-05-22 DIAGNOSIS — E890 Postprocedural hypothyroidism: Secondary | ICD-10-CM | POA: Diagnosis present

## 2021-05-22 DIAGNOSIS — J441 Chronic obstructive pulmonary disease with (acute) exacerbation: Secondary | ICD-10-CM | POA: Diagnosis present

## 2021-05-22 DIAGNOSIS — S72141D Displaced intertrochanteric fracture of right femur, subsequent encounter for closed fracture with routine healing: Secondary | ICD-10-CM | POA: Diagnosis not present

## 2021-05-22 DIAGNOSIS — Z807 Family history of other malignant neoplasms of lymphoid, hematopoietic and related tissues: Secondary | ICD-10-CM | POA: Diagnosis not present

## 2021-05-22 DIAGNOSIS — Z79899 Other long term (current) drug therapy: Secondary | ICD-10-CM | POA: Diagnosis not present

## 2021-05-22 DIAGNOSIS — K922 Gastrointestinal hemorrhage, unspecified: Secondary | ICD-10-CM | POA: Diagnosis present

## 2021-05-22 DIAGNOSIS — Z833 Family history of diabetes mellitus: Secondary | ICD-10-CM | POA: Diagnosis not present

## 2021-05-22 DIAGNOSIS — Z20822 Contact with and (suspected) exposure to covid-19: Secondary | ICD-10-CM | POA: Diagnosis present

## 2021-05-22 DIAGNOSIS — D62 Acute posthemorrhagic anemia: Secondary | ICD-10-CM | POA: Diagnosis present

## 2021-05-22 DIAGNOSIS — W19XXXD Unspecified fall, subsequent encounter: Secondary | ICD-10-CM | POA: Diagnosis present

## 2021-05-22 DIAGNOSIS — Z880 Allergy status to penicillin: Secondary | ICD-10-CM | POA: Diagnosis not present

## 2021-05-22 DIAGNOSIS — K269 Duodenal ulcer, unspecified as acute or chronic, without hemorrhage or perforation: Secondary | ICD-10-CM | POA: Diagnosis not present

## 2021-05-22 DIAGNOSIS — Z820 Family history of epilepsy and other diseases of the nervous system: Secondary | ICD-10-CM | POA: Diagnosis not present

## 2021-05-22 DIAGNOSIS — F419 Anxiety disorder, unspecified: Secondary | ICD-10-CM | POA: Diagnosis present

## 2021-05-22 DIAGNOSIS — K2970 Gastritis, unspecified, without bleeding: Secondary | ICD-10-CM | POA: Diagnosis present

## 2021-05-22 DIAGNOSIS — Z8249 Family history of ischemic heart disease and other diseases of the circulatory system: Secondary | ICD-10-CM | POA: Diagnosis not present

## 2021-05-22 DIAGNOSIS — I700 Atherosclerosis of aorta: Secondary | ICD-10-CM | POA: Diagnosis present

## 2021-05-22 DIAGNOSIS — Z7952 Long term (current) use of systemic steroids: Secondary | ICD-10-CM | POA: Diagnosis not present

## 2021-05-22 DIAGNOSIS — E785 Hyperlipidemia, unspecified: Secondary | ICD-10-CM | POA: Diagnosis present

## 2021-05-22 DIAGNOSIS — Z79890 Hormone replacement therapy (postmenopausal): Secondary | ICD-10-CM | POA: Diagnosis not present

## 2021-05-22 DIAGNOSIS — Z7982 Long term (current) use of aspirin: Secondary | ICD-10-CM | POA: Diagnosis not present

## 2021-05-22 DIAGNOSIS — J449 Chronic obstructive pulmonary disease, unspecified: Secondary | ICD-10-CM | POA: Diagnosis not present

## 2021-05-22 HISTORY — PX: ESOPHAGOGASTRODUODENOSCOPY (EGD) WITH PROPOFOL: SHX5813

## 2021-05-22 LAB — BASIC METABOLIC PANEL
Anion gap: 6 (ref 5–15)
BUN: 25 mg/dL — ABNORMAL HIGH (ref 8–23)
CO2: 24 mmol/L (ref 22–32)
Calcium: 7.5 mg/dL — ABNORMAL LOW (ref 8.9–10.3)
Chloride: 105 mmol/L (ref 98–111)
Creatinine, Ser: 0.48 mg/dL (ref 0.44–1.00)
GFR, Estimated: >60 mL/min (ref >60)
Glucose, Bld: 114 mg/dL — ABNORMAL HIGH (ref 70–99)
Potassium: 4.3 mmol/L (ref 3.5–5.1)
Sodium: 135 mmol/L (ref 135–145)

## 2021-05-22 LAB — CBC
HCT: 29.7 % — ABNORMAL LOW (ref 36.0–46.0)
Hemoglobin: 9.3 g/dL — ABNORMAL LOW (ref 12.0–15.0)
MCH: 30.8 pg (ref 26.0–34.0)
MCHC: 31.3 g/dL (ref 30.0–36.0)
MCV: 98.3 fL (ref 80.0–100.0)
Platelets: 315 10*3/uL (ref 150–400)
RBC: 3.02 MIL/uL — ABNORMAL LOW (ref 3.87–5.11)
RDW: 14.3 % (ref 11.5–15.5)
WBC: 10.2 10*3/uL (ref 4.0–10.5)
nRBC: 0.7 % — ABNORMAL HIGH (ref 0.0–0.2)

## 2021-05-22 LAB — TYPE AND SCREEN
ABO/RH(D): O POS
Antibody Screen: NEGATIVE
Unit division: 0
Unit division: 0

## 2021-05-22 LAB — BPAM RBC
Blood Product Expiration Date: 202207212359
Blood Product Expiration Date: 202207272359
ISSUE DATE / TIME: 202206191120
ISSUE DATE / TIME: 202206191434
Unit Type and Rh: 5100
Unit Type and Rh: 9500

## 2021-05-22 SURGERY — ESOPHAGOGASTRODUODENOSCOPY (EGD) WITH PROPOFOL
Anesthesia: General

## 2021-05-22 MED ORDER — PROPOFOL 10 MG/ML IV BOLUS
INTRAVENOUS | Status: DC | PRN
  Administered 2021-05-22 (×2): 50 mg via INTRAVENOUS

## 2021-05-22 MED ORDER — LACTATED RINGERS IV SOLN: INTRAVENOUS | Status: DC | PRN

## 2021-05-22 MED ORDER — UMECLIDINIUM BROMIDE 62.5 MCG/INH IN AEPB
1.0000 | INHALATION_SPRAY | Freq: Every day | RESPIRATORY_TRACT | Status: DC
  Administered 2021-05-22: 1 via RESPIRATORY_TRACT
  Filled 2021-05-22: qty 7

## 2021-05-22 MED ORDER — SUCRALFATE 1 GM/10ML PO SUSP
1.0000 g | Freq: Three times a day (TID) | ORAL | Status: DC
  Administered 2021-05-22 – 2021-05-24 (×9): 1 g via ORAL
  Filled 2021-05-22 (×9): qty 10

## 2021-05-22 MED ORDER — LIDOCAINE HCL 1 % IJ SOLN
INTRAMUSCULAR | Status: DC | PRN
  Administered 2021-05-22: 60 mg via INTRADERMAL

## 2021-05-22 MED ORDER — LIDOCAINE HCL (PF) 2 % IJ SOLN
INTRAMUSCULAR | Status: AC
  Filled 2021-05-22: qty 5

## 2021-05-22 MED ORDER — PROPOFOL 10 MG/ML IV BOLUS
INTRAVENOUS | Status: AC
  Filled 2021-05-22: qty 40

## 2021-05-22 MED ORDER — STERILE WATER FOR IRRIGATION IR SOLN
Status: DC | PRN
  Administered 2021-05-22: 100 mL

## 2021-05-22 MED ORDER — DEXTROSE-NACL 5-0.9 % IV SOLN: INTRAVENOUS | Status: DC

## 2021-05-22 MED ORDER — MELATONIN 3 MG PO TABS
6.0000 mg | ORAL_TABLET | Freq: Every day | ORAL | Status: DC
  Administered 2021-05-22 – 2021-05-23 (×2): 6 mg via ORAL
  Filled 2021-05-22 (×2): qty 2

## 2021-05-22 NOTE — Anesthesia Postprocedure Evaluation (Signed)
Anesthesia Post Note  Patient: Laura Foster  Procedure(s) Performed: ESOPHAGOGASTRODUODENOSCOPY (EGD) WITH PROPOFOL  Patient location during evaluation: PACU Anesthesia Type: General Level of consciousness: awake Pain management: pain level controlled Vital Signs Assessment: post-procedure vital signs reviewed and stable Respiratory status: spontaneous breathing Cardiovascular status: blood pressure returned to baseline and stable Postop Assessment: no apparent nausea or vomiting Anesthetic complications: no   No notable events documented.   Last Vitals:  Vitals:   05/22/21 0839 05/22/21 1008  BP:  139/72  Pulse:  69  Resp:  20  Temp:  36.7 C  SpO2: 96% 97%    Last Pain:  Vitals:   05/22/21 1028  TempSrc:   PainSc: 0-No pain                 Jacere Pangborn

## 2021-05-22 NOTE — Transfer of Care (Signed)
Immediate Anesthesia Transfer of Care Note  Patient: Laura Foster  Procedure(s) Performed: ESOPHAGOGASTRODUODENOSCOPY (EGD) WITH PROPOFOL  Patient Location: PACU  Anesthesia Type:General  Level of Consciousness: awake  Airway & Oxygen Therapy: Patient Spontanous Breathing  Post-op Assessment: Report given to RN  Post vital signs: Reviewed and stable  Last Vitals:  Vitals Value Taken Time  BP 132/73 05/22/21 1045  Temp    Pulse 55 05/22/21 1045  Resp 20 05/22/21 1045  SpO2 96 % 05/22/21 1045  Vitals shown include unvalidated device data.  Last Pain:  Vitals:   05/22/21 1028  TempSrc:   PainSc: 0-No pain         Complications: No notable events documented.

## 2021-05-22 NOTE — Anesthesia Preprocedure Evaluation (Signed)
Anesthesia Evaluation  Patient identified by MRN, date of birth, ID band Patient awake    Reviewed: Allergy & Precautions, NPO status , Patient's Chart, lab work & pertinent test results  History of Anesthesia Complications Negative for: history of anesthetic complications  Airway Mallampati: II  TM Distance: >3 FB Neck ROM: Full    Dental  (+) Edentulous Upper, Edentulous Lower   Pulmonary shortness of breath and with exertion, COPD, Current Smoker and Patient abstained from smoking.,    Pulmonary exam normal breath sounds clear to auscultation       Cardiovascular Exercise Tolerance: Good Normal cardiovascular exam+ dysrhythmias (Prolonged QT)  Rhythm:Regular Rate:Normal  21-May-2021 08:54:54 [facility] ROUTINE RECORD 1946-02-05 (31 yr) Female Caucasian Room:ER9 Loc:499 Technician: MTD Test ind: Comment 1:: Comment 2:: Comment 3:: Comment 4:: Vent. rate 112 BPM PR interval 127 ms QRS duration 71 ms QT/QTcB 408/557 ms P-R-T axes 91 73 * Sinus tachycardia Atrial premature complex Consider right atrial enlargement Nonspecific repol abnormality, lateral leads Prolonged QT interval   Neuro/Psych PSYCHIATRIC DISORDERS Anxiety Depression    GI/Hepatic negative GI ROS, Neg liver ROS,   Endo/Other  Hypothyroidism   Renal/GU Renal InsufficiencyRenal disease     Musculoskeletal negative musculoskeletal ROS (+)   Abdominal   Peds  Hematology negative hematology ROS (+)   Anesthesia Other Findings   Reproductive/Obstetrics negative OB ROS                           Anesthesia Physical Anesthesia Plan  ASA: 3  Anesthesia Plan: General   Post-op Pain Management:    Induction: Intravenous  PONV Risk Score and Plan: Propofol infusion  Airway Management Planned: Nasal Cannula and Natural Airway  Additional Equipment:   Intra-op Plan:   Post-operative Plan:    Informed Consent: I have reviewed the patients History and Physical, chart, labs and discussed the procedure including the risks, benefits and alternatives for the proposed anesthesia with the patient or authorized representative who has indicated his/her understanding and acceptance.     Dental advisory given  Plan Discussed with: CRNA and Surgeon  Anesthesia Plan Comments:         Anesthesia Quick Evaluation

## 2021-05-22 NOTE — Interval H&P Note (Signed)
History and Physical Interval Note:  05/22/2021 10:07 AM  Laura Foster  has presented today for surgery, with the diagnosis of Melena.  The various methods of treatment have been discussed with the patient and family. After consideration of risks, benefits and other options for treatment, the patient has consented to  Procedure(s): ESOPHAGOGASTRODUODENOSCOPY (EGD) WITH PROPOFOL (N/A) as a surgical intervention.  The patient's history has been reviewed, patient examined, no change in status, stable for surgery.  I have reviewed the patient's chart and labs.  Questions were answered to the patient's satisfaction.     [name]

## 2021-05-22 NOTE — Progress Notes (Signed)
Patient seen briefly today in anticipation for EGD with Propofol. History of right hip fracture with surgery 05/16/21, discharged 05/18/21 on aspirin 325 mg.  Returned yesterday with melena and worsening anemia (previously 8.9 at discharge from hip repair and 7.3 on admission yesterday). Received 2 units PRBCs.   Denies any overt bleeding this morning. On 1 liter nasal cannula, no shortness of breath. Hgb 9.3, up from 7.3 at admission yesterday. EGD planned for today. As of note, last colonoscopy in 2016 with sigmoid diverticulosis. Doubt occult colonic lesion and clinically consistent with most likely UGI bleed. If negative EGD, may need to consider capsule. Unclear if she would tolerate bowel prep at this time following recent surgery but may need to update colonoscopy as well if concern for occult lower GI lesion.   Discussed risks and benefits with her again with stated understanding. Sister at bedside as well. She has been NPO since midnight. Agreeable to proceed.  Annitta Needs, PhD, ANP-BC [facility]

## 2021-05-22 NOTE — TOC Initial Note (Signed)
Transition of Care [facility]) - Initial/Assessment Note    Patient Details  Name: Laura Foster MRN: [id number] Date of Birth: [birth year]  Transition of Care [facility]) CM/SW Contact:    Salome Arnt, LCSW Phone Number: 05/22/2021, 3:00 PM  Clinical Narrative:  Pt admitted due to acute GI bleed. Pt d/c from hospital to [facility] last week for rehab following hip fracture. Pt states she does not want to return to [place] and requests placement in [place]. LCSW initiated referral. Requested MD order PT eval for SNF authorization. Per Jackelyn Poling at [place], okay to return if needed.                  Expected Discharge Plan: Skilled Nursing Facility Barriers to Discharge: Continued Medical Work up   Patient Goals and CMS Choice Patient states their goals for this hospitalization and ongoing recovery are:: SNF   Choice offered to / list presented to : Patient  Expected Discharge Plan and Services Expected Discharge Plan: [place] In-house Referral: Clinical Social Work   Post Acute Care Choice: [place] Living arrangements for the past 2 months: [place], [place]                 DME Arranged: N/A DME Agency: NA                  Prior Living Arrangements/Services Living arrangements for the past 2 months: [place], [place] Lives with:: Facility Resident Patient language and need for interpreter reviewed:: Yes Do you feel safe going back to the place where you live?: Yes            Criminal Activity/Legal Involvement Pertinent to Current Situation/Hospitalization: No - Comment as needed  Activities of Daily Living Home Assistive Devices/Equipment: Dentures (specify type), Eyeglasses, Grab bars in shower, Raised toilet seat with rails ADL Screening (condition at time of admission) Patient's cognitive ability adequate to safely complete daily activities?: Yes Is the patient deaf or  have difficulty hearing?: No Does the patient have difficulty seeing, even when wearing glasses/contacts?: No Does the patient have difficulty concentrating, remembering, or making decisions?: Yes (occassionally) Patient able to express need for assistance with ADLs?: Yes Does the patient have difficulty dressing or bathing?: No Independently performs ADLs?: Yes (appropriate for developmental age) Does the patient have difficulty walking or climbing stairs?: No Weakness of Legs: Right (right hip fx 05/16/21) Weakness of Arms/Hands: None  Permission Sought/Granted                  Emotional Assessment   Attitude/Demeanor/Rapport: Engaged Affect (typically observed): Accepting Orientation: : Oriented to Self, Oriented to Place, Oriented to  Time, Oriented to Situation Alcohol / Substance Use: Not Applicable Psych Involvement: No (comment)  Admission diagnosis:  Acute GI bleeding [K92.2] Acute respiratory failure with hypoxia (HCC) [J96.01] Patient Active Problem List   Diagnosis Date Noted   Acute GI bleeding 05/21/2021   Acute respiratory failure with hypoxia ([place]) 05/18/2021   COPD with acute exacerbation ([place]) 05/18/2021   Closed displaced intertrochanteric fracture of right femur ([place]) 05/15/2021   Prolonged QT interval 05/15/2021   COPD (chronic obstructive pulmonary disease) ([place]) 05/15/2021   Depression 05/15/2021   Memory disorder 04/24/2018   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   

## 2021-05-22 NOTE — NC FL2 (Signed)
[place] LEVEL OF CARE SCREENING TOOL     IDENTIFICATION  Patient Name: Laura Foster Birthdate: March 01, 1946 Sex: female Admission Date (Current Location): 05/21/2021  [facility] and [place] Number:  [employer] and Address:  [place] [address] [place]      Provider Number: [id number]  Attending Physician Name and Address:  Roxan Hockey, MD  Relative Name and Phone Number:       Current Level of Care: Hospital Recommended Level of Care: [place] Prior Approval Number:    Date Approved/Denied:   PASRR Number: [id number]  Discharge Plan: SNF    Current Diagnoses: Patient Active Problem List   Diagnosis Date Noted   Acute GI bleeding 05/21/2021   Acute respiratory failure with hypoxia ([place]) 05/18/2021   COPD with acute exacerbation ([place]) 05/18/2021   Closed displaced intertrochanteric fracture of right femur ([place]) 05/15/2021   Prolonged QT interval 05/15/2021   COPD (chronic obstructive pulmonary disease) ([place]) 05/15/2021   Depression 05/15/2021   Memory disorder 04/24/2018   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 See d/c summary for updates.)  AMBULATORY STATUS COMMUNICATION OF NEEDS Skin   Extensive Assist Verbally Surgical wounds                       Personal Care Assistance Level of Assistance  Bathing, Dressing, Feeding Bathing Assistance: Maximum assistance Feeding assistance: Limited assistance Dressing Assistance: Maximum assistance     Functional Limitations Info  Sight, Speech, Hearing Sight Info: Impaired Hearing Info: Adequate Speech Info: Adequate    SPECIAL CARE FACTORS  FREQUENCY  PT (By licensed PT)     PT Frequency: 5x weekly              Contractures      Additional Factors Info  Code Status, Allergies, Psychotropic Code Status Info: Full code Allergies Info: Penicillins Psychotropic Info: Xanax, Celexa, Namenda, Aricept         Current Medications (05/22/2021):  This is the current hospital active medication list Current Facility-Administered Medications  Medication Dose Route Frequency Provider Last Rate Last Admin   0.9 %  sodium chloride infusion  250 mL Intravenous PRN Emokpae, Courage, MD       acetaminophen (TYLENOL) tablet 650 mg  650 mg Oral Q6H PRN Emokpae, Courage, MD       Or   acetaminophen (TYLENOL) suppository 650 mg  650 mg Rectal Q6H PRN Emokpae, Courage, MD       albuterol (PROVENTIL) (2.5 MG/3ML) 0.083% nebulizer solution 2.5 mg  2.5 mg Nebulization Q2H PRN Emokpae, Courage, MD       ALPRAZolam Duanne Moron) tablet 0.5 mg  0.5 mg Oral BID PRN Denton Brick, Courage, MD   0.5 mg at 05/21/21 2242   atorvastatin (LIPITOR) tablet 10 mg  10 mg Oral Daily Emokpae, Courage, MD   10 mg at 05/22/21 0916   bisacodyl (DULCOLAX) suppository 10 mg  10 mg Rectal Daily PRN Emokpae, Courage, MD       dextrose 5 %-0.9 % sodium chloride infusion   Intravenous Continuous Denton Brick, Courage, MD 40 mL/hr at 05/22/21 0918 Rate Change at 05/22/21 0918   fluticasone  furoate-vilanterol (BREO ELLIPTA) 100-25 MCG/INH 1 puff  1 puff Inhalation QHS Emokpae, Courage, MD       HYDROcodone-acetaminophen (NORCO/VICODIN) 5-325 MG per tablet 1 tablet  1 tablet Oral Q4H PRN Roxan Hockey, MD   1 tablet at 05/21/21 2242   levothyroxine (SYNTHROID) tablet 75 mcg  75 mcg Oral QAC breakfast Denton Brick, Courage, MD   75 mcg at 05/22/21 0618   melatonin tablet 6 mg  6 mg Oral QHS Emokpae, Courage, MD       multivitamin with minerals tablet 1 tablet  1 tablet Oral Daily Emokpae, Courage, MD   1 tablet at 05/22/21 0916   pantoprozole (PROTONIX) 80 mg /NS 100 mL infusion  8 mg/hr  Intravenous Continuous Emokpae, Courage, MD 10 mL/hr at 05/22/21 1219 8 mg/hr at 05/22/21 1219   polyethylene glycol (MIRALAX / GLYCOLAX) packet 17 g  17 g Oral Daily PRN Emokpae, Courage, MD       sodium chloride flush (NS) 0.9 % injection 3 mL  3 mL Intravenous Q12H Emokpae, Courage, MD   3 mL at 05/22/21 0919   sodium chloride flush (NS) 0.9 % injection 3 mL  3 mL Intravenous Q12H Emokpae, Courage, MD   3 mL at 05/22/21 0918   sodium chloride flush (NS) 0.9 % injection 3 mL  3 mL Intravenous PRN Emokpae, Courage, MD       sucralfate (CARAFATE) 1 GM/10ML suspension 1 g  1 g Oral TID WC & HS Annitta Needs, NP   1 g at 05/22/21 1240   umeclidinium bromide (INCRUSE ELLIPTA) 62.5 MCG/INH 1 puff  1 puff Inhalation QHS Roxan Hockey, MD         Discharge Medications: Please see discharge summary for a list of discharge medications.  Relevant Imaging Results:  Relevant Lab Results:   Additional Information SSN: [id number]. Pt received Pfizer vaccine on 05/18/21.  Salome Arnt, LCSW

## 2021-05-22 NOTE — Op Note (Signed)
[facility] Patient Name: Laura Foster Procedure Date: 05/22/2021 10:13 AM MRN: [id number] Date of Birth: [birth year] Attending MD: Elon Alas. Abbey Chatters DO CSN: [id number] Age: 75 Admit Type: Inpatient Procedure:                Upper GI endoscopy Indications:              Acute post hemorrhagic anemia, Melena Providers:                Elon Alas. Abbey Chatters, DO, Lambert Mody, Dereck Leep, Technician Referring MD:              Medicines:                See the Anesthesia note for documentation of the                            administered medications Complications:            No immediate complications. Estimated Blood Loss:     Estimated blood loss: none. Procedure:                Pre-Anesthesia Assessment:                           - The anesthesia plan was to use monitored                            anesthesia care (MAC).                           After obtaining informed consent, the endoscope was                            passed under direct vision. Throughout the                            procedure, the patient's blood pressure, pulse, and                            oxygen saturations were monitored continuously. The                            [id number]-[id number] ([id number]) scope was introduced through the                            mouth, and advanced to the second part of duodenum.                            The upper GI endoscopy was accomplished without                            difficulty. The patient tolerated the procedure                            well. Scope In:  10:27:15 AM Scope Out: 10:32:18 AM Total Procedure Duration: 0 hours 5 minutes 3 seconds  Findings:      There is no endoscopic evidence of bleeding, areas of erosion,       esophagitis, ulcerations or varices in the entire esophagus.      Diffuse mild inflammation characterized by erythema was found in the       entire examined stomach.      There is no endoscopic  evidence of bleeding in the entire examined       stomach.      One non-obstructing non-bleeding cratered duodenal ulcer with no       stigmata of bleeding was found in the duodenal bulb. The lesion was 20       mm in largest dimension. There is no evidence of perforation. No visible       vessel identified. No old blood or clots.      Three non-bleeding superficial duodenal ulcers with no stigmata of       bleeding were found in the first portion of the duodenum. Impression:               - Gastritis.                           - Non-obstructing non-bleeding duodenal ulcer with                            no stigmata of bleeding. NSAID induced etiology.                            There is no evidence of perforation.                           - Non-bleeding duodenal ulcers with no stigmata of                            bleeding.                           - No specimens collected. Moderate Sedation:      Per Anesthesia Care Recommendation:           - Return patient to hospital ward for ongoing care.                           - Full liquid diet.                           - Continue present medications.                           - Continue PPI infusion for 72 hours then switch to                            PO BID x12 weeks.                           - Avoid NSAIDs                           -  Use sucralfate suspension 1 gram PO QID. Procedure Code(s):        --- Professional ---                           [id number], Esophagogastroduodenoscopy, flexible,                            transoral; diagnostic, including collection of                            specimen(s) by brushing or washing, when performed                            (separate procedure) Diagnosis Code(s):        --- Professional ---                           K29.70, Gastritis, unspecified, without bleeding                           T39.395S, Adverse effect of other nonsteroidal                            anti-inflammatory drugs  [NSAID], sequela                           K26.9, Duodenal ulcer, unspecified as acute or                            chronic, without hemorrhage or perforation                           D62, Acute posthemorrhagic anemia                           K92.1, Melena (includes Hematochezia) CPT copyright 2019 [employer]. All rights reserved. The codes documented in this report are preliminary and upon coder review may  be revised to meet current compliance requirements. Elon Alas. Abbey Chatters, DO [place] Abbey Chatters, DO 05/22/2021 11:12:12 AM This report has been signed electronically. Number of Addenda: 0

## 2021-05-22 NOTE — Progress Notes (Signed)
Patient Demographics:    Laura Foster, is a 75 y.o. female, DOB - [birth year], WTU:[id number]  Admit date - 05/21/2021   Admitting Physician Junita Kubota Denton Brick, MD  Outpatient Primary MD for the patient is [name], MD  LOS - 0   Chief Complaint  Patient presents with   Rectal Bleeding        Subjective:    Laura Foster today has no fevers, no emesis,  No chest pain,   Sister at bedside questions answered   Assessment  & Plan :    Principal Problem:   Acute GI bleeding Active Problems:   Hypothyroidism following radioiodine therapy   Memory disorder   Closed displaced intertrochanteric fracture of right femur ([place])   COPD (chronic obstructive pulmonary disease) ([place])   Depression  Brief Summary:- 75 y.o. female with pmhx relevant for tobacco abuse and COPD, dementia, HLD, hypothyroidism admitted on 05/21/2021 from [facility] rehab --Patient had hip surgery after fracture on 05/16/2021 was discharged to [facility] on 05/18/2021 on aspirin 325 mg daily  A/p 1) acute GI bleed--leading to acute on chronic symptomatic anemia in the setting of ABLA- --Patient had hip surgery after fracture on 05/16/2021 was discharged to [facility] on 05/18/2021 on aspirin 325 mg daily  H&H today is 7.3 and 22.3,  was 8.9 and 27.8 on 05/18/21  BUN of 40 and creatinine of 0.61 today, it was  19 and 0.71 respectively on 05/16/21 -No nausea no vomiting she does have some periumbilical pain -Had colonoscopy in 2016 with sigmoid diverticulosis otherwise no acute findings -GI consult appreciated -Transfused PRBC, monitor H&H, IV Protonix as ordered EGD on 05/22/2021 with gastritis, as well as gastric and duodenal ulcers presumed NSAID induced-- -GI service recommends IV Protonix for 72 hours, along with Carafate -Stopped aspirin  -Hemoglobin is up to 9.3 from 7.3 after transfusion of 2 units of PRBCs   2) COPD---  no acute exacerbation continue bronchodilators, avoid steroids if possible given acute GI bleed -Supplemental oxygen as needed   3) hypothyroidism--- continue levothyroxine   4) social/ethics--- patient is a full code, Patients daughter [name] is Economist, lives in [place] of care discussed with patient sister at bedside on 05/22/2021   5) dementia--- hold Aricept due to prolonged QT, supportive care   6)HLD-continue Lipitor   7) depression/anxiety--- hold Celexa due to prolonged QT, use Xanax as ordered   8)Close right hip fracture--comminuted intertrochanteric femur fx -continue PRN analgesics -6/14/2200R-IM nail -Supportive care, PT eval prior to discharge back to SNF rehab   Disposition/Need for in-Hospital Stay- patient unable to be discharged at this time due to --acute GI bleed requiring transfusion, and IV Protonix   Dispo: The patient is from: SNF              Anticipated d/c is to: SNF              Anticipated d/c date is: 2 days              Patient currently is not medically stable to d/c. Barriers: Not Clinically Stable-  Code Status :  -  Code Status: Full Code   Family Communication:   Discussed with sister at bedside  Consults  :  Gi  DVT Prophylaxis  :   - SCDs   SCDs Start: 05/21/21 1825 Place TED hose Start: 05/21/21 1825 SCDs Start: 05/21/21 1206 Place TED hose Start: 05/21/21 1206   Lab Results  Component Value Date   PLT 315 05/22/2021    Inpatient Medications  Scheduled Meds:  atorvastatin  10 mg Oral Daily   fluticasone furoate-vilanterol  1 puff Inhalation QHS   levothyroxine  75 mcg Oral QAC breakfast   melatonin  6 mg Oral QHS   multivitamin with minerals  1 tablet Oral Daily   sodium chloride flush  3 mL Intravenous Q12H   sodium chloride flush  3 mL Intravenous Q12H   sucralfate  1 g Oral TID WC & HS   umeclidinium bromide  1 puff Inhalation QHS   Continuous Infusions:  sodium chloride     dextrose 5 % and 0.9% NaCl 40 mL/hr  at 05/22/21 4081   pantoprazole 8 mg/hr (05/22/21 1814)   PRN Meds:.sodium chloride, acetaminophen **OR** acetaminophen, albuterol, ALPRAZolam, bisacodyl, HYDROcodone-acetaminophen, polyethylene glycol, sodium chloride flush    Anti-infectives (From admission, onward)    None         Objective:   Vitals:   05/22/21 1110 05/22/21 1128 05/22/21 1204 05/22/21 1259  BP: 133/81 122/77 124/75 135/72  Pulse: 70 68 66 70  Resp: (!) 22   18  Temp:  98.2 F (36.8 C) (!) 97.5 F (36.4 C) 98.3 F (36.8 C)  TempSrc:   Oral Oral  SpO2:   96% 93%  Weight:      Height:        Wt Readings from Last 3 Encounters:  05/21/21 69 kg  05/15/21 69.9 kg  10/05/20 69.9 kg     Intake/Output Summary (Last 24 hours) at 05/22/2021 1930 Last data filed at 05/22/2021 1814 Gross per 24 hour  Intake 2110.45 ml  Output 350 ml  Net 1760.45 ml   Physical Exam  Physical Examination: General appearance - alert, and in no distress Eyes - sclera anicteric Nose- [place] 2L./min Neck - supple, no JVD elevation , Chest - clear  to auscultation bilaterally, symmetrical air movement, Heart - S1 and S2 normal, regular Abdomen - soft, mild periumbilical discomfort no rebound or guarding nondistended, +BS, ND Neurological -generalized weakness without acute focal deficits  extremities - no pedal edema noted, intact peripheral pulses Skin - warm, dry Psych--underlying memory and cognitive deficits appear to be at baseline MSK--right hip postop wound clean intact and dry   Data Review:   Micro Results Recent Results (from the past 240 hour(s))  Resp Panel by RT-PCR (Flu A&B, Covid) Nasopharyngeal Swab     Status: None   Collection Time: 05/15/21  3:12 PM   Specimen: Nasopharyngeal Swab; Nasopharyngeal(NP) swabs in vial transport medium  Result Value Ref Range Status   SARS Coronavirus 2 by RT PCR NEGATIVE NEGATIVE Final    Comment: (NOTE) SARS-CoV-2 target nucleic acids are NOT DETECTED.  The  SARS-CoV-2 RNA is generally detectable in upper respiratory specimens during the acute phase of infection. The lowest concentration of SARS-CoV-2 viral copies this assay can detect is 138 copies/mL. A negative result does not preclude SARS-Cov-2 infection and should not be used as the sole basis for treatment or other patient management decisions. A negative result may occur with  improper specimen collection/handling, submission of specimen other than nasopharyngeal swab, presence of viral mutation(s) within the areas targeted by this assay, and inadequate number of viral copies(<138 copies/mL). A negative result must  be combined with clinical observations, patient history, and epidemiological information. The expected result is Negative.  Fact Sheet for Patients:  EntrepreneurPulse.com.au  Fact Sheet for Healthcare Providers:  IncredibleEmployment.be  This test is no t yet approved or cleared by the [place] FDA and  has been authorized for detection and/or diagnosis of SARS-CoV-2 by FDA under an Emergency Use Authorization (EUA). This EUA will remain  in effect (meaning this test can be used) for the duration of the COVID-19 declaration under Section 564(b)(1) of the Act, 21 U.S.C.section 360bbb-3(b)(1), unless the authorization is terminated  or revoked sooner.       Influenza A by PCR NEGATIVE NEGATIVE Final   Influenza B by PCR NEGATIVE NEGATIVE Final    Comment: (NOTE) The Xpert Xpress SARS-CoV-2/FLU/RSV plus assay is intended as an aid in the diagnosis of influenza from Nasopharyngeal swab specimens and should not be used as a sole basis for treatment. Nasal washings and aspirates are unacceptable for Xpert Xpress SARS-CoV-2/FLU/RSV testing.  Fact Sheet for Patients: EntrepreneurPulse.com.au  Fact Sheet for Healthcare Providers: IncredibleEmployment.be  This test is not yet approved or  cleared by the [place] FDA and has been authorized for detection and/or diagnosis of SARS-CoV-2 by FDA under an Emergency Use Authorization (EUA). This EUA will remain in effect (meaning this test can be used) for the duration of the COVID-19 declaration under Section 564(b)(1) of the Act, 21 U.S.C. section 360bbb-3(b)(1), unless the authorization is terminated or revoked.  Performed at [facility], [address] [place], [place] [address]   Surgical pcr screen     Status: None   Collection Time: 05/16/21  7:05 AM  Result Value Ref Range Status   MRSA, PCR NEGATIVE NEGATIVE Final   Staphylococcus aureus NEGATIVE NEGATIVE Final    Comment: (NOTE) The Xpert SA Assay (FDA approved for NASAL specimens in patients 6 years of age and older), is one component of a comprehensive surveillance program. It is not intended to diagnose infection nor to guide or monitor treatment. Performed at [facility], [address] [place], [place] [address]   Resp Panel by RT-PCR (Flu A&B, Covid) Nasopharyngeal Swab     Status: None   Collection Time: 05/21/21  9:25 AM   Specimen: Nasopharyngeal Swab; Nasopharyngeal(NP) swabs in vial transport medium  Result Value Ref Range Status   SARS Coronavirus 2 by RT PCR NEGATIVE NEGATIVE Final    Comment: (NOTE) SARS-CoV-2 target nucleic acids are NOT DETECTED.  The SARS-CoV-2 RNA is generally detectable in upper respiratory specimens during the acute phase of infection. The lowest concentration of SARS-CoV-2 viral copies this assay can detect is 138 copies/mL. A negative result does not preclude SARS-Cov-2 infection and should not be used as the sole basis for treatment or other patient management decisions. A negative result may occur with  improper specimen collection/handling, submission of specimen other than nasopharyngeal swab, presence of viral mutation(s) within the areas targeted by this assay, and inadequate number of viral copies(<138  copies/mL). A negative result must be combined with clinical observations, patient history, and epidemiological information. The expected result is Negative.  Fact Sheet for Patients:  EntrepreneurPulse.com.au  Fact Sheet for Healthcare Providers:  IncredibleEmployment.be  This test is no t yet approved or cleared by the [place] FDA and  has been authorized for detection and/or diagnosis of SARS-CoV-2 by FDA under an Emergency Use Authorization (EUA). This EUA will remain  in effect (meaning this test can be used) for the duration of the  COVID-19 declaration under Section 564(b)(1) of the Act, 21 U.S.C.section 360bbb-3(b)(1), unless the authorization is terminated  or revoked sooner.       Influenza A by PCR NEGATIVE NEGATIVE Final   Influenza B by PCR NEGATIVE NEGATIVE Final    Comment: (NOTE) The Xpert Xpress SARS-CoV-2/FLU/RSV plus assay is intended as an aid in the diagnosis of influenza from Nasopharyngeal swab specimens and should not be used as a sole basis for treatment. Nasal washings and aspirates are unacceptable for Xpert Xpress SARS-CoV-2/FLU/RSV testing.  Fact Sheet for Patients: EntrepreneurPulse.com.au  Fact Sheet for Healthcare Providers: IncredibleEmployment.be  This test is not yet approved or cleared by the [place] FDA and has been authorized for detection and/or diagnosis of SARS-CoV-2 by FDA under an Emergency Use Authorization (EUA). This EUA will remain in effect (meaning this test can be used) for the duration of the COVID-19 declaration under Section 564(b)(1) of the Act, 21 U.S.C. section 360bbb-3(b)(1), unless the authorization is terminated or revoked.  Performed at [facility], [address] [place], [place] [address]     Radiology Reports DG Chest 1 View  Result Date: 05/15/2021 CLINICAL DATA:  Right hip pain following fall, initial encounter EXAM: CHEST   1 VIEW COMPARISON:  06/20/2020 FINDINGS: Cardiac shadow is within normal limits. The lungs are well aerated bilaterally. No focal infiltrate or sizable effusion is seen. Bony abnormality is noted IMPRESSION: No active disease. Electronically Signed   By: Inez Catalina M.D.   On: 05/15/2021 16:21   DG Pelvis 1-2 Views  Result Date: 05/16/2021 CLINICAL DATA:  Post IM nail RIGHT femur EXAM: PELVIS - 1-2 VIEW COMPARISON:  Portable exam 1356 hours compared to earlier intraoperative images of 05/16/2021 FINDINGS: IM nail with compression screw at RIGHT femur across a reduced intertrochanteric fracture. Old IM nail with locking screw at proximal LEFT femur. Bones demineralized. Hip and SI joint spaces preserved. No additional fracture or dislocation seen. Atherosclerotic calcifications aorta and iliac arteries. IMPRESSION: Post nailing of intertrochanteric fracture RIGHT femur. Aortic Atherosclerosis (ICD10-I70.0). Electronically Signed   By: Lavonia Dana M.D.   On: 05/16/2021 14:19   DG Pelvis 1-2 Views  Result Date: 05/15/2021 CLINICAL DATA:  Recent fall with pelvic pain on the right, initial encounter EXAM: PELVIS - 1 VIEW COMPARISON:  None. FINDINGS: Comminuted right intratrochanteric fracture is noted with mild impaction at the fracture site. Medullary rod is noted within the proximal left femur. Pelvic ring appears intact. No soft tissue abnormality is noted. IMPRESSION: Right intratrochanteric femoral fracture. Electronically Signed   By: Inez Catalina M.D.   On: 05/15/2021 16:20   DG Chest Portable 1 View  Result Date: 05/21/2021 CLINICAL DATA:  Shortness of breath. EXAM: PORTABLE CHEST 1 VIEW COMPARISON:  May 15, 2021 FINDINGS: The cardiac silhouette is normal. Mediastinal contours appear intact. Tortuosity and calcific atherosclerotic disease of the aorta. There is no evidence of focal airspace consolidation, pleural effusion or pneumothorax. Osseous structures are without acute abnormality. Soft  tissues are grossly normal. IMPRESSION: 1. No active disease. 2. Tortuosity and calcific atherosclerotic disease of the aorta. Electronically Signed   By: Fidela Salisbury M.D.   On: 05/21/2021 11:35   DG HIP OPERATIVE UNILAT W OR W/O PELVIS RIGHT  Result Date: 05/16/2021 CLINICAL DATA:  RIGHT hip fracture, ORIF EXAM: OPERATIVE RIGHT HIP (WITH PELVIS IF PERFORMED) 12 VIEWS TECHNIQUE: Fluoroscopic spot image(s) were submitted for interpretation post-operatively. COMPARISON:  05/15/2021 FLUOROSCOPY TIME:  3 minutes 19 seconds FINDINGS: Images demonstrate placement of an  IM nail with a compression screw across a intertrochanteric fracture of the RIGHT femur. Bones demineralized. No dislocation or additional fracture identified. IMPRESSION: Post ORIF of intertrochanteric fracture RIGHT femur Electronically Signed   By: Lavonia Dana M.D.   On: 05/16/2021 13:39   DG FEMUR, MIN 2 VIEWS RIGHT  Result Date: 05/16/2021 CLINICAL DATA:  Post RIGHT femoral nail placement EXAM: RIGHT FEMUR 2 VIEWS COMPARISON:  Portable exam 1357 hours compared intraoperative images of 05/16/2021 FINDINGS: IM nail with compression screw at proximal RIGHT femur across a reduced intertrochanteric fracture. Locking screw at mid femur. No additional fracture or dislocation identified. Joint space narrowing RIGHT knee. RIGHT hip joint space preserved. IMPRESSION: Post ORIF intertrochanteric fracture RIGHT femur. No acute complications. Electronically Signed   By: Lavonia Dana M.D.   On: 05/16/2021 14:20   DG FEMUR, MIN 2 VIEWS RIGHT  Result Date: 05/15/2021 CLINICAL DATA:  Recent with right hip pain, initial encounter EXAM: RIGHT FEMUR 2 VIEWS COMPARISON:  None. FINDINGS: Comminuted intratrochanteric fracture of the proximal right femur is noted. No dislocation is seen. Distal femur is within normal limits. No gross soft tissue abnormality is noted. IMPRESSION: Comminuted intratrochanteric right femoral fracture. Electronically Signed    By: Inez Catalina M.D.   On: 05/15/2021 16:20     CBC Recent Labs  Lab 05/16/21 0421 05/17/21 0437 05/18/21 0553 05/21/21 0902 05/22/21 0543  WBC 8.6 10.2 9.5 9.3 10.2  HGB 10.9* 9.1* 8.9* 7.3* 9.3*  HCT 33.8* 28.3* 27.8* 22.3* 29.7*  PLT 274 236 230 317 315  MCV 99.1 100.4* 100.0 97.8 98.3  MCH 32.0 32.3 32.0 32.0 30.8  MCHC 32.2 32.2 32.0 32.7 31.3  RDW 13.3 13.1 13.1 13.4 14.3    Chemistries  Recent Labs  Lab 05/16/21 0421 05/21/21 0902 05/22/21 0543  NA 135 132* 135  K 3.9 4.0 4.3  CL 104 96* 105  CO2 25 27 24   GLUCOSE 126* 144* 114*  BUN 19 40* 25*  CREATININE 0.71 0.61 0.48  CALCIUM 8.0* 8.0* 7.5*  AST  --  16  --   ALT  --  19  --   ALKPHOS  --  57  --   BILITOT  --  0.7  --    ------------------------------------------------------------------------------------------------------------------ No results for input(s): CHOL, HDL, LDLCALC, TRIG, CHOLHDL, LDLDIRECT in the last 72 hours.  No results found for: HGBA1C ------------------------------------------------------------------------------------------------------------------ No results for input(s): TSH, T4TOTAL, T3FREE, THYROIDAB in the last 72 hours.  Invalid input(s): FREET3 ------------------------------------------------------------------------------------------------------------------ No results for input(s): VITAMINB12, FOLATE, FERRITIN, TIBC, IRON, RETICCTPCT in the last 72 hours.  Coagulation profile Recent Labs  Lab 05/21/21 1036  INR 1.1    No results for input(s): DDIMER in the last 72 hours.  Cardiac Enzymes No results for input(s): CKMB, TROPONINI, MYOGLOBIN in the last 168 hours.  Invalid input(s): CK ------------------------------------------------------------------------------------------------------------------    Component Value Date/Time   BNP 52.0 05/21/2021 0902     Gloria Lambertson M.D on 05/22/2021 at 7:30 PM  Go to [email or web address] - for contact info  [facility]  - Office  [phone]

## 2021-05-23 DIAGNOSIS — K922 Gastrointestinal hemorrhage, unspecified: Secondary | ICD-10-CM

## 2021-05-23 DIAGNOSIS — K269 Duodenal ulcer, unspecified as acute or chronic, without hemorrhage or perforation: Secondary | ICD-10-CM

## 2021-05-23 LAB — BASIC METABOLIC PANEL
Anion gap: 7 (ref 5–15)
BUN: 18 mg/dL (ref 8–23)
CO2: 24 mmol/L (ref 22–32)
Calcium: 7.6 mg/dL — ABNORMAL LOW (ref 8.9–10.3)
Chloride: 102 mmol/L (ref 98–111)
Creatinine, Ser: 0.59 mg/dL (ref 0.44–1.00)
GFR, Estimated: >60 mL/min (ref >60)
Glucose, Bld: 85 mg/dL (ref 70–99)
Potassium: 3.6 mmol/L (ref 3.5–5.1)
Sodium: 133 mmol/L — ABNORMAL LOW (ref 135–145)

## 2021-05-23 LAB — CBC
HCT: 29.3 % — ABNORMAL LOW (ref 36.0–46.0)
Hemoglobin: 9.1 g/dL — ABNORMAL LOW (ref 12.0–15.0)
MCH: 30.6 pg (ref 26.0–34.0)
MCHC: 31.1 g/dL (ref 30.0–36.0)
MCV: 98.7 fL (ref 80.0–100.0)
Platelets: 302 10*3/uL (ref 150–400)
RBC: 2.97 MIL/uL — ABNORMAL LOW (ref 3.87–5.11)
RDW: 14.4 % (ref 11.5–15.5)
WBC: 10 10*3/uL (ref 4.0–10.5)
nRBC: 0.6 % — ABNORMAL HIGH (ref 0.0–0.2)

## 2021-05-23 MED ORDER — MEMANTINE HCL 10 MG PO TABS
10.0000 mg | ORAL_TABLET | Freq: Two times a day (BID) | ORAL | Status: DC
  Administered 2021-05-23 – 2021-05-24 (×3): 10 mg via ORAL
  Filled 2021-05-23 (×3): qty 1

## 2021-05-23 NOTE — Evaluation (Signed)
Physical Therapy Evaluation Patient Details Name: Laura Foster MRN: [id number] DOB: 15-Jul-1946 Today's Date: 05/23/2021   History of Present Illness  Laura Foster  is a 75 y.o. female with pmhx relevant for tobacco abuse and COPD, dementia, HLD, hypothyroidism who presents to the ED from [facility] rehab with concerns about dark/black stools as well as generalized weakness  -No chest pains no palpitations, no shortness of breath at rest she did have some dyspnea on exertion  No fever  Or chills  -Patient had hip surgery after fracture on 05/16/2021 was discharged to [facility] on 05/18/2021 on aspirin 325 mg daily   Clinical Impression  Patient demonstrates labored movement for sitting up at bedside without use of bed rail, very unsteady on feet and limited to a few side steps at bedside before having to sit due to fatigue and poor standing balance.  Patient tolerated sitting up in chair after therapy - nursing staff notified.  Patient will benefit from continued physical therapy in hospital and recommended venue below to increase strength, balance, endurance for safe ADLs and gait.       Follow Up Recommendations SNF    Equipment Recommendations  None recommended by PT    Recommendations for Other Services       Precautions / Restrictions Precautions Precautions: Fall Restrictions Weight Bearing Restrictions: Yes RLE Weight Bearing: Weight bearing as tolerated      Mobility  Bed Mobility Overal bed mobility: Needs Assistance Bed Mobility: Supine to Sit     Supine to sit: Min guard;Min assist     General bed mobility comments: slightly labored movement, increased time    Transfers Overall transfer level: Needs assistance Equipment used: Rolling walker (2 wheeled) Transfers: Sit to/from [facility] Sit to Stand: Min assist Stand pivot transfers: Min assist       General transfer comment: slow labored unsteady  movement  Ambulation/Gait Ambulation/Gait assistance: Mod assist Gait Distance (Feet): 5 Feet Assistive device: Rolling walker (2 wheeled) Gait Pattern/deviations: Decreased step length - right;Decreased step length - left;Decreased stride length;Decreased stance time - right;Antalgic;Shuffle Gait velocity: decreased   General Gait Details: limited to a few steps at bedside before having to sit due to fatigue and SOB while on 4 LPM O2  Stairs            Wheelchair Mobility    Modified Rankin (Stroke Patients Only)       Balance Overall balance assessment: Needs assistance Sitting-balance support: Feet supported;No upper extremity supported Sitting balance-Leahy Scale: Fair Sitting balance - Comments: fair/good seated at EOB   Standing balance support: During functional activity;Bilateral upper extremity supported Standing balance-Leahy Scale: Poor Standing balance comment: fair/poor using RW                             Pertinent Vitals/Pain Pain Assessment: No/denies pain    Home Living Family/patient expects to be discharged to:: Private residence Living Arrangements: Alone Available Help at Discharge: Family;Available PRN/intermittently Type of Home: House Home Access: Stairs to enter Entrance Stairs-Rails: None Entrance Stairs-Number of Steps: 1 Home Layout: One level Home Equipment: Walker - 2 wheels;Cane - single point;Shower seat;Bedside commode      Prior Function Level of Independence: Needs assistance   Gait / Transfers Assistance Needed: Community ambulator without AD, does not drive  ADL's / Homemaking Assistance Needed: assisted by family for community ADLs        Hand Dominance  Dominant Hand: Left    Extremity/Trunk Assessment   Upper Extremity Assessment Upper Extremity Assessment: Generalized weakness    Lower Extremity Assessment Lower Extremity Assessment: Generalized weakness;RLE deficits/detail RLE Deficits /  Details: grossly -4/5 RLE Sensation: WNL RLE Coordination: WNL    Cervical / Trunk Assessment Cervical / Trunk Assessment: Normal  Communication   Communication: No difficulties  Cognition Arousal/Alertness: Awake/alert Behavior During Therapy: WFL for tasks assessed/performed Overall Cognitive Status: Within Functional Limits for tasks assessed                                        General Comments      Exercises     Assessment/Plan    PT Assessment Patient needs continued PT services  PT Problem List Decreased strength;Decreased activity tolerance;Decreased balance;Decreased mobility       PT Treatment Interventions DME instruction;Gait training;Stair training;Functional mobility training;Therapeutic activities;Therapeutic exercise;Balance training;Patient/family education    PT Goals (Current goals can be found in the Care Plan section)  Acute Rehab PT Goals Patient Stated Goal: return home after rehab PT Goal Formulation: With patient Time For Goal Achievement: 06/06/21 Potential to Achieve Goals: Good    Frequency Min 3X/week   Barriers to discharge        Co-evaluation               AM-PAC PT "6 Clicks" Mobility  Outcome Measure Help needed turning from your back to your side while in a flat bed without using bedrails?: A Little Help needed moving from lying on your back to sitting on the side of a flat bed without using bedrails?: A Little Help needed moving to and from a bed to a chair (including a wheelchair)?: A Lot Help needed standing up from a chair using your arms (e.g., wheelchair or bedside chair)?: A Lot Help needed to walk in hospital room?: A Lot Help needed climbing 3-5 steps with a railing? : Total 6 Click Score: 13    End of Session Equipment Utilized During Treatment: Oxygen Activity Tolerance: Patient tolerated treatment well;Patient limited by fatigue Patient left: in chair;with call bell/phone within reach;with  chair alarm set Nurse Communication: Mobility status PT Visit Diagnosis: Unsteadiness on feet (R26.81);Other abnormalities of gait and mobility (R26.89);Muscle weakness (generalized) (M62.81)    Time: 8832-5498 PT Time Calculation (min) (ACUTE ONLY): 26 min   Charges:   PT Evaluation $PT Eval Moderate Complexity: 1 Mod PT Treatments $Therapeutic Activity: 23-37 mins        3:34 PM, 05/23/21 Lonell Grandchild, MPT Physical Therapist with [facility] [phone] office [phone] mobile phone

## 2021-05-23 NOTE — Progress Notes (Signed)
    Subjective: Patient is unclear if she has had any bright red blood per rectum or black stools, but nursing staff has confirmed no overt GI bleeding.  Denies abdominal pain, nausea, vomiting or any other GI concerns.  Her bowels are moving well.  Objective: Vital signs in last 24 hours: Temp:  [97.7 F (36.5 C)-98.3 F (36.8 C)] 98 F (36.7 C) (06/21 0426) Pulse Rate:  [66-70] 66 (06/21 0426) Resp:  [18-20] 18 (06/21 0426) BP: (109-135)/(70-78) 109/70 (06/21 0426) SpO2:  [93 %-100 %] 94 % (06/21 0426) Last BM Date: 05/21/21 General:   Alert and oriented, pleasant Head:  Normocephalic and atraumatic. Abdomen:  Bowel sounds present.abdomen is full but soft and nontender.  No HSM or hernias noted. No rebound or guarding. No masses appreciated  Extremities:  Without edema. Neurologic:  Alert and  oriented x4;  grossly normal neurologically. Psych:  Normal mood and affect.  Intake/Output from previous day: 06/20 0701 - 06/21 0700 In: 1205.3 [P.O.:806; I.V.:399.3] Out: 350 [Urine:350] Intake/Output this shift: No intake/output data recorded.  Lab Results: Recent Labs    05/21/21 0902 05/22/21 0543 05/23/21 0524  WBC 9.3 10.2 10.0  HGB 7.3* 9.3* 9.1*  HCT 22.3* 29.7* 29.3*  PLT 317 315 302   BMET Recent Labs    05/21/21 0902 05/22/21 0543 05/23/21 0524  NA 132* 135 133*  K 4.0 4.3 3.6  CL 96* 105 102  CO2 27 24 24   GLUCOSE 144* 114* 85  BUN 40* 25* 18  CREATININE 0.61 0.48 0.59  CALCIUM 8.0* 7.5* 7.6*   LFT Recent Labs    05/21/21 0902  PROT 5.5*  ALBUMIN 2.6*  AST 16  ALT 19  ALKPHOS 57  BILITOT 0.7   PT/INR Recent Labs    05/21/21 1036  LABPROT 13.8  INR 1.1    Assessment: 75 year old female with history of COPD, recent right hip surgery on 05/16/2021, discharged on aspirin 325 mg daily who presented to the emergency room with melena and anemia.  Hemoglobin at discharge from hip repair was 8.9 and hemoglobin 7.3 on admission with elevated BUN.   Underwent EGD 6/20 revealing gastritis, nonobstructing nonbleeding cratered ulcer with no stigmata of bleed in the duodenal bulb, 3 nonbleeding superficial duodenal ulcers in the first portion of the duodenum.  Recommended completing PPI infusion for 72 hours and switching to PPI twice daily x12 weeks.  Also recommended Carafate 1 g 4 times daily.  Clinically, she is doing well.  No further overt GI bleeding.  She has received 2 unit PRBCs this admission with hemoglobin 9.3 yesterday and stable at 9.1 today.    Plan: 1.  Complete 72-hour PPI infusion.  This should be completed tomorrow. 2.  After PPI infusion, transition to oral pantoprazole 40 mg twice daily. 3.  Continue Carafate 3 times daily before meals and at bedtime. 4.  Avoid NSAIDs. 5.  Continue to monitor H/H and for overt GI bleeding while hospitalized. Anticipate discharge in the next 24 hours.  5.  GI will sign off. Please re-consult for any further needs. We will arrange follow-up outpatient.     LOS: 1 day    05/23/2021, 12:09 PM   Aliene Altes, [facility]

## 2021-05-23 NOTE — Progress Notes (Addendum)
Patient Demographics:    Laura Foster, is a 75 y.o. female, DOB - 1946-08-26, YOV:[id number]  Admit date - 05/21/2021   Admitting Physician Courage Denton Brick, MD  Outpatient Primary MD for the patient is [name], MD  LOS - 1   Chief Complaint  Patient presents with   Rectal Bleeding        Subjective:    [facility] no chest pain, no shortness of breath, no nausea, no vomiting, no overt bleeding.  Tolerating full liquid diet.   Assessment  & Plan :    Principal Problem:   Acute GI bleeding Active Problems:   Hypothyroidism following radioiodine therapy   Memory disorder   Closed displaced intertrochanteric fracture of right femur (HCC)   COPD (chronic obstructive pulmonary disease) ([place])   Depression   Duodenal ulcer  Brief Summary:- 75 y.o. female with pmhx relevant for tobacco abuse and COPD, dementia, HLD, hypothyroidism admitted on 05/21/2021 from [facility] rehab --Patient had hip surgery after fracture on 05/16/2021 was discharged to [facility] on 05/18/2021 on aspirin 325 mg daily  A/p 1) acute GI bleed--leading to acute on chronic symptomatic anemia in the setting of ABLA- --Patient had hip surgery after fracture on 05/16/2021 was discharged to [facility] on 05/18/2021 on aspirin 325 mg daily  H&H today is 7.3 and 22.3,  was 8.9 and 27.8 on 05/18/21  BUN of 40 and creatinine of 0.61 today, it was  19 and 0.71 respectively on 05/16/21 -No nausea no vomiting she does have some periumbilical pain -Had colonoscopy in 2016 with sigmoid diverticulosis otherwise no acute findings -GI consult and recommendations appreciated -Transfused PRBC, monitor H&H, IV Protonix as ordered EGD on 05/22/2021 with gastritis, as well as gastric and duodenal ulcers presumed NSAID induced-- -GI service recommends IV Protonix for 72 hours, along with Carafate (infusion will be completed on  05/24/2021). -Stopped aspirin  -Hemoglobin is up to 9.3 from 7.3 after transfusion of 2 units of PRBCs -Continue to follow hemoglobin trend.   2) COPD--- no acute exacerbation continue bronchodilators, avoid steroids if possible given acute GI bleed -Continue supplemental oxygen as needed   3) hypothyroidism--- continue levothyroxine   4) social/ethics--- patient is a full code, Patients daughter [name] is Economist, lives in [place] of care discussed with patient sister at bedside on 05/22/2021 by Dr. Denton Brick; no family member seen on today's evaluation.   5) dementia- -Overall stable and with mentation at baseline -Continue the use of Aricept.   6)HLD-continue Lipitor   7) depression/anxiety- -Will continue holding Celexa -Continue as needed Xanax.   8)Close right hip fracture--comminuted intertrochanteric femur fx -continue PRN analgesics -6/14/2200R-IM nail -Continue supportive care, PT eval prior to discharge back to SNF rehab.   Disposition/Need for in-Hospital Stay- patient unable to be discharged at this time due to need for IV Protonix drip and close monitoring of hemoglobin trend.  Dispo: The patient is from: SNF              Anticipated d/c is to: SNF              Anticipated d/c date is: 2 days              Patient currently is not medically  stable to d/c.   Barriers: Not Clinically Stable-  Code Status :  -  Code Status: Full Code   Family Communication:   Discussed with sister at bedside  Consults  :  Gi  DVT Prophylaxis  :   - SCDs   SCDs Start: 05/21/21 1825 Place TED hose Start: 05/21/21 1825 SCDs Start: 05/21/21 1206 Place TED hose Start: 05/21/21 1206   Lab Results  Component Value Date   PLT 302 05/23/2021    Inpatient Medications  Scheduled Meds:  atorvastatin  10 mg Oral Daily   fluticasone furoate-vilanterol  1 puff Inhalation QHS   levothyroxine  75 mcg Oral QAC breakfast   melatonin  6 mg Oral QHS   memantine  10 mg Oral BID    multivitamin with minerals  1 tablet Oral Daily   sodium chloride flush  3 mL Intravenous Q12H   sodium chloride flush  3 mL Intravenous Q12H   sucralfate  1 g Oral TID WC & HS   umeclidinium bromide  1 puff Inhalation QHS   Continuous Infusions:  sodium chloride     dextrose 5 % and 0.9% NaCl 40 mL/hr at 05/23/21 0557   pantoprazole 8 mg/hr (05/23/21 1653)   PRN Meds:.sodium chloride, acetaminophen **OR** acetaminophen, albuterol, ALPRAZolam, bisacodyl, HYDROcodone-acetaminophen, polyethylene glycol, sodium chloride flush    Anti-infectives (From admission, onward)    None         Objective:   Vitals:   05/22/21 2122 05/22/21 2318 05/23/21 0426 05/23/21 1301  BP: 130/78  109/70 (!) 158/96  Pulse: 69  66 63  Resp: 20  18 16   Temp: 97.7 F (36.5 C)  98 F (36.7 C) 97.8 F (36.6 C)  TempSrc: Oral  Oral Oral  SpO2: 99% 100% 94% 93%  Weight:      Height:        Wt Readings from Last 3 Encounters:  05/21/21 69 kg  05/15/21 69.9 kg  10/05/20 69.9 kg     Intake/Output Summary (Last 24 hours) at 05/23/2021 1804 Last data filed at 05/23/2021 1500 Gross per 24 hour  Intake 1363.08 ml  Output 700 ml  Net 663.08 ml   Physical Exam General exam: Alert, awake, oriented x 2; in no acute distress; denies chest pain, no nausea, no vomiting.  No operative bleeding reported. Respiratory system: Clear to auscultation. Respiratory effort normal.  2 L nasal cannula supplementation in place.  No using accessory muscles. Cardiovascular system:RRR. No murmurs, rubs, gallops. Gastrointestinal system: Abdomen is nondistended, soft and nontender. No organomegaly or masses felt. Normal bowel sounds heard. Central nervous system: Alert and oriented. No focal neurological deficits. Extremities: No cyanosis or clubbing.  Right hip postoperative wound clean, intact and dry.  Healing appropriately. Skin: No petechiae. Psychiatry: Mood & affect appropriate.    Data Review:   Micro  Results Recent Results (from the past 240 hour(s))  Resp Panel by RT-PCR (Flu A&B, Covid) Nasopharyngeal Swab     Status: None   Collection Time: 05/15/21  3:12 PM   Specimen: Nasopharyngeal Swab; Nasopharyngeal(NP) swabs in vial transport medium  Result Value Ref Range Status   SARS Coronavirus 2 by RT PCR NEGATIVE NEGATIVE Final    Comment: (NOTE) SARS-CoV-2 target nucleic acids are NOT DETECTED.  The SARS-CoV-2 RNA is generally detectable in upper respiratory specimens during the acute phase of infection. The lowest concentration of SARS-CoV-2 viral copies this assay can detect is 138 copies/mL. A negative result does not preclude SARS-Cov-2 infection and  should not be used as the sole basis for treatment or other patient management decisions. A negative result may occur with  improper specimen collection/handling, submission of specimen other than nasopharyngeal swab, presence of viral mutation(s) within the areas targeted by this assay, and inadequate number of viral copies(<138 copies/mL). A negative result must be combined with clinical observations, patient history, and epidemiological information. The expected result is Negative.  Fact Sheet for Patients:  EntrepreneurPulse.com.au  Fact Sheet for Healthcare Providers:  IncredibleEmployment.be  This test is no t yet approved or cleared by the [place] FDA and  has been authorized for detection and/or diagnosis of SARS-CoV-2 by FDA under an Emergency Use Authorization (EUA). This EUA will remain  in effect (meaning this test can be used) for the duration of the COVID-19 declaration under Section 564(b)(1) of the Act, 21 U.S.C.section 360bbb-3(b)(1), unless the authorization is terminated  or revoked sooner.       Influenza A by PCR NEGATIVE NEGATIVE Final   Influenza B by PCR NEGATIVE NEGATIVE Final    Comment: (NOTE) The Xpert Xpress SARS-CoV-2/FLU/RSV plus assay is intended as  an aid in the diagnosis of influenza from Nasopharyngeal swab specimens and should not be used as a sole basis for treatment. Nasal washings and aspirates are unacceptable for Xpert Xpress SARS-CoV-2/FLU/RSV testing.  Fact Sheet for Patients: EntrepreneurPulse.com.au  Fact Sheet for Healthcare Providers: IncredibleEmployment.be  This test is not yet approved or cleared by the [place] FDA and has been authorized for detection and/or diagnosis of SARS-CoV-2 by FDA under an Emergency Use Authorization (EUA). This EUA will remain in effect (meaning this test can be used) for the duration of the COVID-19 declaration under Section 564(b)(1) of the Act, 21 U.S.C. section 360bbb-3(b)(1), unless the authorization is terminated or revoked.  Performed at [facility], [address] [place], [place] [address]   Surgical pcr screen     Status: None   Collection Time: 05/16/21  7:05 AM  Result Value Ref Range Status   MRSA, PCR NEGATIVE NEGATIVE Final   Staphylococcus aureus NEGATIVE NEGATIVE Final    Comment: (NOTE) The Xpert SA Assay (FDA approved for NASAL specimens in patients 64 years of age and older), is one component of a comprehensive surveillance program. It is not intended to diagnose infection nor to guide or monitor treatment. Performed at [facility], [address], [place], [place] [address]   Resp Panel by RT-PCR (Flu A&B, Covid) Nasopharyngeal Swab     Status: None   Collection Time: 05/21/21  9:25 AM   Specimen: Nasopharyngeal Swab; Nasopharyngeal(NP) swabs in vial transport medium  Result Value Ref Range Status   SARS Coronavirus 2 by RT PCR NEGATIVE NEGATIVE Final    Comment: (NOTE) SARS-CoV-2 target nucleic acids are NOT DETECTED.  The SARS-CoV-2 RNA is generally detectable in upper respiratory specimens during the acute phase of infection. The lowest concentration of SARS-CoV-2 viral copies this assay can detect is 138  copies/mL. A negative result does not preclude SARS-Cov-2 infection and should not be used as the sole basis for treatment or other patient management decisions. A negative result may occur with  improper specimen collection/handling, submission of specimen other than nasopharyngeal swab, presence of viral mutation(s) within the areas targeted by this assay, and inadequate number of viral copies(<138 copies/mL). A negative result must be combined with clinical observations, patient history, and epidemiological information. The expected result is Negative.  Fact Sheet for Patients:  EntrepreneurPulse.com.au  Fact Sheet for Healthcare Providers:  IncredibleEmployment.be  This test is no t yet approved or cleared by the [place] and  has been authorized for detection and/or diagnosis of SARS-CoV-2 by FDA under an Emergency Use Authorization (EUA). This EUA will remain  in effect (meaning this test can be used) for the duration of the COVID-19 declaration under Section 564(b)(1) of the Act, 21 U.S.C.section 360bbb-3(b)(1), unless the authorization is terminated  or revoked sooner.       Influenza A by PCR NEGATIVE NEGATIVE Final   Influenza B by PCR NEGATIVE NEGATIVE Final    Comment: (NOTE) The Xpert Xpress SARS-CoV-2/FLU/RSV plus assay is intended as an aid in the diagnosis of influenza from Nasopharyngeal swab specimens and should not be used as a sole basis for treatment. Nasal washings and aspirates are unacceptable for Xpert Xpress SARS-CoV-2/FLU/RSV testing.  Fact Sheet for Patients: EntrepreneurPulse.com.au  Fact Sheet for Healthcare Providers: IncredibleEmployment.be  This test is not yet approved or cleared by the [place] FDA and has been authorized for detection and/or diagnosis of SARS-CoV-2 by FDA under an Emergency Use Authorization (EUA). This EUA will remain in effect (meaning  this test can be used) for the duration of the COVID-19 declaration under Section 564(b)(1) of the Act, 21 U.S.C. section 360bbb-3(b)(1), unless the authorization is terminated or revoked.  Performed at [facility], [address] [place], [place] [address]     Radiology Reports DG Chest 1 View  Result Date: 05/15/2021 CLINICAL DATA:  Right hip pain following fall, initial encounter EXAM: CHEST  1 VIEW COMPARISON:  06/20/2020 FINDINGS: Cardiac shadow is within normal limits. The lungs are well aerated bilaterally. No focal infiltrate or sizable effusion is seen. Bony abnormality is noted IMPRESSION: No active disease. Electronically Signed   By: Inez Catalina M.D.   On: 05/15/2021 16:21   DG Pelvis 1-2 Views  Result Date: 05/16/2021 CLINICAL DATA:  Post IM nail RIGHT femur EXAM: PELVIS - 1-2 VIEW COMPARISON:  Portable exam 1356 hours compared to earlier intraoperative images of 05/16/2021 FINDINGS: IM nail with compression screw at RIGHT femur across a reduced intertrochanteric fracture. Old IM nail with locking screw at proximal LEFT femur. Bones demineralized. Hip and SI joint spaces preserved. No additional fracture or dislocation seen. Atherosclerotic calcifications aorta and iliac arteries. IMPRESSION: Post nailing of intertrochanteric fracture RIGHT femur. Aortic Atherosclerosis (ICD10-I70.0). Electronically Signed   By: Lavonia Dana M.D.   On: 05/16/2021 14:19   DG Pelvis 1-2 Views  Result Date: 05/15/2021 CLINICAL DATA:  Recent fall with pelvic pain on the right, initial encounter EXAM: PELVIS - 1 VIEW COMPARISON:  None. FINDINGS: Comminuted right intratrochanteric fracture is noted with mild impaction at the fracture site. Medullary rod is noted within the proximal left femur. Pelvic ring appears intact. No soft tissue abnormality is noted. IMPRESSION: Right intratrochanteric femoral fracture. Electronically Signed   By: Inez Catalina M.D.   On: 05/15/2021 16:20   DG Chest Portable 1  View  Result Date: 05/21/2021 CLINICAL DATA:  Shortness of breath. EXAM: PORTABLE CHEST 1 VIEW COMPARISON:  May 15, 2021 FINDINGS: The cardiac silhouette is normal. Mediastinal contours appear intact. Tortuosity and calcific atherosclerotic disease of the aorta. There is no evidence of focal airspace consolidation, pleural effusion or pneumothorax. Osseous structures are without acute abnormality. Soft tissues are grossly normal. IMPRESSION: 1. No active disease. 2. Tortuosity and calcific atherosclerotic disease of the aorta. Electronically Signed   By: Fidela Salisbury M.D.   On: 05/21/2021 11:35   DG HIP  OPERATIVE UNILAT W OR W/O PELVIS RIGHT  Result Date: 05/16/2021 CLINICAL DATA:  RIGHT hip fracture, ORIF EXAM: OPERATIVE RIGHT HIP (WITH PELVIS IF PERFORMED) 12 VIEWS TECHNIQUE: Fluoroscopic spot image(s) were submitted for interpretation post-operatively. COMPARISON:  05/15/2021 FLUOROSCOPY TIME:  3 minutes 19 seconds FINDINGS: Images demonstrate placement of an IM nail with a compression screw across a intertrochanteric fracture of the RIGHT femur. Bones demineralized. No dislocation or additional fracture identified. IMPRESSION: Post ORIF of intertrochanteric fracture RIGHT femur Electronically Signed   By: Lavonia Dana M.D.   On: 05/16/2021 13:39   DG FEMUR, MIN 2 VIEWS RIGHT  Result Date: 05/16/2021 CLINICAL DATA:  Post RIGHT femoral nail placement EXAM: RIGHT FEMUR 2 VIEWS COMPARISON:  Portable exam 1357 hours compared intraoperative images of 05/16/2021 FINDINGS: IM nail with compression screw at proximal RIGHT femur across a reduced intertrochanteric fracture. Locking screw at mid femur. No additional fracture or dislocation identified. Joint space narrowing RIGHT knee. RIGHT hip joint space preserved. IMPRESSION: Post ORIF intertrochanteric fracture RIGHT femur. No acute complications. Electronically Signed   By: Lavonia Dana M.D.   On: 05/16/2021 14:20   DG FEMUR, MIN 2 VIEWS  RIGHT  Result Date: 05/15/2021 CLINICAL DATA:  Recent with right hip pain, initial encounter EXAM: RIGHT FEMUR 2 VIEWS COMPARISON:  None. FINDINGS: Comminuted intratrochanteric fracture of the proximal right femur is noted. No dislocation is seen. Distal femur is within normal limits. No gross soft tissue abnormality is noted. IMPRESSION: Comminuted intratrochanteric right femoral fracture. Electronically Signed   By: Inez Catalina M.D.   On: 05/15/2021 16:20     CBC Recent Labs  Lab 05/17/21 0437 05/18/21 0553 05/21/21 0902 05/22/21 0543 05/23/21 0524  WBC 10.2 9.5 9.3 10.2 10.0  HGB 9.1* 8.9* 7.3* 9.3* 9.1*  HCT 28.3* 27.8* 22.3* 29.7* 29.3*  PLT 236 230 317 315 302  MCV 100.4* 100.0 97.8 98.3 98.7  MCH 32.3 32.0 32.0 30.8 30.6  MCHC 32.2 32.0 32.7 31.3 31.1  RDW 13.1 13.1 13.4 14.3 14.4    Chemistries  Recent Labs  Lab 05/21/21 0902 05/22/21 0543 05/23/21 0524  NA 132* 135 133*  K 4.0 4.3 3.6  CL 96* 105 102  CO2 27 24 24   GLUCOSE 144* 114* 85  BUN 40* 25* 18  CREATININE 0.61 0.48 0.59  CALCIUM 8.0* 7.5* 7.6*  AST 16  --   --   ALT 19  --   --   ALKPHOS 57  --   --   BILITOT 0.7  --   --      Coagulation profile Recent Labs  Lab 05/21/21 1036  INR 1.1       Component Value Date/Time   BNP 52.0 05/21/2021 0902     Barton Dubois M.D on 05/23/2021 at 6:04 PM  Go to [email or web address] - for contact info  [facility] - Office  [phone]

## 2021-05-23 NOTE — Plan of Care (Signed)

## 2021-05-23 NOTE — Plan of Care (Signed)
  Problem: Acute Rehab PT Goals(only PT should resolve) Goal: Pt Will Go Supine/Side To Sit Outcome: Progressing Flowsheets (Taken 05/23/2021 1536) Pt will go Supine/Side to Sit: with min guard assist Goal: Patient Will Transfer Sit To/From Stand Outcome: Progressing Flowsheets (Taken 05/23/2021 1536) Patient will transfer sit to/from stand:  with min guard assist  with minimal assist Goal: Pt Will Transfer Bed To Chair/Chair To Bed Outcome: Progressing Flowsheets (Taken 05/23/2021 1536) Pt will Transfer Bed to Chair/Chair to Bed: with min assist Goal: Pt Will Ambulate Outcome: Progressing Flowsheets (Taken 05/23/2021 1536) Pt will Ambulate:  25 feet  with minimal assist  with rolling walker   3:37 PM, 05/23/21 Lonell Grandchild, MPT Physical Therapist with [facility] [phone] office [phone] mobile phone

## 2021-05-23 NOTE — TOC Progression Note (Addendum)
Transition of Care [facility]) - Progression Note    Patient Details  Name: Laura Foster MRN: [id number] Date of Birth: 17-Mar-1946  Transition of [facility] CM/SW Contact  Ihor Gully, LCSW Phone Number: 05/23/2021, 12:29 PM  Clinical Narrative:    Discussed with patient that she was referred to multiple facilities in [place], however none had made be offers. Patient is agreeable to return to [place]. Auth will need to be started again. PT consult requested from attending.   SNF auth started    Expected Discharge Plan: [place] Barriers to Discharge: Continued Medical Work up  Expected Discharge Plan and Services Expected Discharge Plan: [place] In-house Referral: Clinical Social Work   Post Acute Care Choice: [place] arrangements for the past 2 months: [place], [place]                 DME Arranged: N/A DME Agency: NA                   Social Determinants of Health (SDOH) Interventions    Readmission Risk Interventions No flowsheet data found.

## 2021-05-24 DIAGNOSIS — Z743 Need for continuous supervision: Secondary | ICD-10-CM | POA: Diagnosis not present

## 2021-05-24 DIAGNOSIS — J449 Chronic obstructive pulmonary disease, unspecified: Secondary | ICD-10-CM

## 2021-05-24 DIAGNOSIS — S72141D Displaced intertrochanteric fracture of right femur, subsequent encounter for closed fracture with routine healing: Secondary | ICD-10-CM

## 2021-05-24 DIAGNOSIS — Z7401 Bed confinement status: Secondary | ICD-10-CM | POA: Diagnosis not present

## 2021-05-24 DIAGNOSIS — Z720 Tobacco use: Secondary | ICD-10-CM | POA: Diagnosis not present

## 2021-05-24 DIAGNOSIS — J9601 Acute respiratory failure with hypoxia: Secondary | ICD-10-CM | POA: Diagnosis not present

## 2021-05-24 DIAGNOSIS — R2689 Other abnormalities of gait and mobility: Secondary | ICD-10-CM | POA: Diagnosis not present

## 2021-05-24 DIAGNOSIS — R279 Unspecified lack of coordination: Secondary | ICD-10-CM | POA: Diagnosis not present

## 2021-05-24 DIAGNOSIS — K269 Duodenal ulcer, unspecified as acute or chronic, without hemorrhage or perforation: Secondary | ICD-10-CM | POA: Diagnosis not present

## 2021-05-24 DIAGNOSIS — R600 Localized edema: Secondary | ICD-10-CM | POA: Diagnosis not present

## 2021-05-24 DIAGNOSIS — K259 Gastric ulcer, unspecified as acute or chronic, without hemorrhage or perforation: Secondary | ICD-10-CM | POA: Diagnosis not present

## 2021-05-24 DIAGNOSIS — D62 Acute posthemorrhagic anemia: Secondary | ICD-10-CM | POA: Diagnosis not present

## 2021-05-24 DIAGNOSIS — R413 Other amnesia: Secondary | ICD-10-CM | POA: Diagnosis not present

## 2021-05-24 DIAGNOSIS — E039 Hypothyroidism, unspecified: Secondary | ICD-10-CM | POA: Diagnosis not present

## 2021-05-24 DIAGNOSIS — M19042 Primary osteoarthritis, left hand: Secondary | ICD-10-CM | POA: Diagnosis not present

## 2021-05-24 DIAGNOSIS — R5381 Other malaise: Secondary | ICD-10-CM | POA: Diagnosis not present

## 2021-05-24 DIAGNOSIS — M6281 Muscle weakness (generalized): Secondary | ICD-10-CM | POA: Diagnosis not present

## 2021-05-24 DIAGNOSIS — J441 Chronic obstructive pulmonary disease with (acute) exacerbation: Secondary | ICD-10-CM | POA: Diagnosis not present

## 2021-05-24 DIAGNOSIS — R059 Cough, unspecified: Secondary | ICD-10-CM | POA: Diagnosis not present

## 2021-05-24 DIAGNOSIS — E890 Postprocedural hypothyroidism: Secondary | ICD-10-CM

## 2021-05-24 DIAGNOSIS — F32A Depression, unspecified: Secondary | ICD-10-CM | POA: Diagnosis not present

## 2021-05-24 DIAGNOSIS — I4581 Long QT syndrome: Secondary | ICD-10-CM | POA: Diagnosis not present

## 2021-05-24 DIAGNOSIS — R2681 Unsteadiness on feet: Secondary | ICD-10-CM | POA: Diagnosis not present

## 2021-05-24 DIAGNOSIS — I5020 Unspecified systolic (congestive) heart failure: Secondary | ICD-10-CM | POA: Diagnosis not present

## 2021-05-24 DIAGNOSIS — K922 Gastrointestinal hemorrhage, unspecified: Secondary | ICD-10-CM | POA: Diagnosis not present

## 2021-05-24 DIAGNOSIS — M19032 Primary osteoarthritis, left wrist: Secondary | ICD-10-CM | POA: Diagnosis not present

## 2021-05-24 DIAGNOSIS — R412 Retrograde amnesia: Secondary | ICD-10-CM | POA: Diagnosis not present

## 2021-05-24 LAB — CBC
HCT: 28.5 % — ABNORMAL LOW (ref 36.0–46.0)
Hemoglobin: 9.2 g/dL — ABNORMAL LOW (ref 12.0–15.0)
MCH: 31.6 pg (ref 26.0–34.0)
MCHC: 32.3 g/dL (ref 30.0–36.0)
MCV: 97.9 fL (ref 80.0–100.0)
Platelets: 328 10*3/uL (ref 150–400)
RBC: 2.91 MIL/uL — ABNORMAL LOW (ref 3.87–5.11)
RDW: 14.4 % (ref 11.5–15.5)
WBC: 8.8 10*3/uL (ref 4.0–10.5)
nRBC: 0.2 % (ref 0.0–0.2)

## 2021-05-24 LAB — BASIC METABOLIC PANEL
Anion gap: 7 (ref 5–15)
BUN: 10 mg/dL (ref 8–23)
CO2: 23 mmol/L (ref 22–32)
Calcium: 7.8 mg/dL — ABNORMAL LOW (ref 8.9–10.3)
Chloride: 102 mmol/L (ref 98–111)
Creatinine, Ser: 0.55 mg/dL (ref 0.44–1.00)
GFR, Estimated: >60 mL/min (ref >60)
Glucose, Bld: 99 mg/dL (ref 70–99)
Potassium: 3.3 mmol/L — ABNORMAL LOW (ref 3.5–5.1)
Sodium: 132 mmol/L — ABNORMAL LOW (ref 135–145)

## 2021-05-24 MED ORDER — PANTOPRAZOLE SODIUM 40 MG PO TBEC: 40.0000 mg | DELAYED_RELEASE_TABLET | Freq: Two times a day (BID) | ORAL | Status: DC

## 2021-05-24 MED ORDER — ACETAMINOPHEN 500 MG PO TABS
1000.0000 mg | ORAL_TABLET | Freq: Three times a day (TID) | ORAL | Status: DC | PRN
Start: 2021-05-24 — End: 2021-08-14

## 2021-05-24 MED ORDER — SUCRALFATE 1 GM/10ML PO SUSP: 1.0000 g | Freq: Four times a day (QID) | ORAL | Status: DC

## 2021-05-24 MED ORDER — ALPRAZOLAM 0.5 MG PO TABS: 0.5000 mg | ORAL_TABLET | Freq: Every morning | ORAL | 0 refills | Status: DC

## 2021-05-24 NOTE — Progress Notes (Signed)
Report called to [facility] at [place]  .EMS  has been called, will get patient ready for discharge back to SNF. VSS.

## 2021-05-24 NOTE — Plan of Care (Signed)

## 2021-05-24 NOTE — Progress Notes (Signed)
Patient discharged via stretcher EMS to [facility]. Patients sister made aware she is going to [employer]. IV's removed without difficulty . Discharge summary and belongings sent with patient, VSS>

## 2021-05-24 NOTE — Discharge Summary (Signed)
Physician Discharge Summary  Laura Foster DJS:[id number] DOB: Jul 17, 1946 DOA: 05/21/2021  PCP: [name], MD  Admit date: 05/21/2021 Discharge date: 05/24/2021  Time spent: 35 minutes  Recommendations for Outpatient Follow-up:  Repeat CBC in 10 days to follow hemoglobin trend and instability. Repeat basic metabolic panel to follow electrolytes and renal function. Outpatient follow-up with gastroenterology service as instructed Avoid the use of NSAIDs.  Discharge Diagnoses:  Principal Problem:   Acute GI bleeding Active Problems:   Hypothyroidism following radioiodine therapy   Memory disorder   Closed displaced intertrochanteric fracture of right femur (HCC)   COPD (chronic obstructive pulmonary disease) (HCC)   Depression   Duodenal ulcer   Acute blood loss anemia   Discharge Condition: Stable and improved.  Discharged back to skilled nursing facility for further care and rehabilitation.  Outpatient follow-up in 3-4 weeks with GI service.  Follow-up with orthopedic service as previously instructed.  CODE STATUS: Full code.  Diet recommendation: Heart healthy diet.  Filed Weights   05/21/21 0850  Weight: 69 kg    History of present illness:  As per H&P written by Dr. Denton Brick on 05/21/2021  Laura Foster  is a 75 y.o. female with pmhx relevant for tobacco abuse and COPD, dementia, HLD, hypothyroidism who presents to the ED from [facility] rehab with concerns about dark/black stools as well as generalized weakness -No chest pains no palpitations, no shortness of breath at rest she did have some dyspnea on exertion No fever  Or chills -Patient had hip surgery after fracture on 05/16/2021 was discharged to [facility] on 05/18/2021 on aspirin 325 mg daily    H&H today is 7.3 and 22.3,  was 8.9 and 27.8 on 05/18/21  BUN of 40 and creatinine of 0.61 today, it was  19 and 0.71 respectively on 05/16/21 -No nausea no vomiting she does have some periumbilical pain -Had  colonoscopy in 2016 with sigmoid diverticulosis otherwise no acute findings -EDP initiated transfusion of PRBC and called hospitalist for admission.  Hospital Course:  1) acute GI bleed--leading to acute on chronic symptomatic anemia in the setting of ABLA- --Patient had hip surgery after fracture on 05/16/2021 was discharged to [facility] on 05/18/2021 on aspirin 325 mg daily also 50 mg of prednisone daily for 5 days, in order to assist with COPD exacerbation.  H&H on presentation was 7.3 and 22.3,  was 8.9 and 27.8 on 05/18/21; BUN of 40 and creatinine of 0.61 today, it was  19 and 0.71 respectively on 05/16/21 -No nausea, no vomiting and no further episodes of melanotic stools or overt bleeding since admission. -Had colonoscopy in 2016 with sigmoid diverticulosis otherwise no acute findings.  No colonoscopy evaluation needed at this time. -GI consult and recommendations appreciated -EGD on 05/22/2021 with gastritis, as well as gastric and duodenal ulcers presumed NSAID induced-- -Following GI recommendations patient has completed 72 hours of IV Protonix infusion; will now be discharged on PPI twice a day for at least 73-month and to continue use of Carafate 4 times a day.  Follow-up in 3-4 weeks as an outpatient. -Aspirin has been stopped as recommended by GI services; patient advised to avoid the use of NSAIDs. -Hemoglobin is up to 9.2 from 7.3 after transfusion of 2 units of PRBCs -Continue to follow hemoglobin trend intermittently.    2) COPD--- no acute exacerbation  -continue bronchodilators, avoid steroids if possible given recent acute GI bleed. -Continue supplemental oxygen as needed   3) hypothyroidism -continue levothyroxine -Continue  to follow follow-up thyroid panel intermittently as an outpatient.   4) social/ethics--- patient is a full code, Patients daughter [name] is Economist, lives in [place] of care discussed with patient sister at bedside on 05/24/2021 at bedside.    5) dementia- -Overall stable and with mentation at baseline -Continue the use of Aricept and Namenda.   6)HLD -continue Lipitor   7) depression/anxiety- -Resume home antidepressant/anxiolytic medication (Celexa and as needed Xanax).   8)Close right hip fracture--comminuted intertrochanteric femur fx -continue PRN analgesics -6/14/2200R-IM nail -Continue supportive care, PT eval prior to discharge back to SNF rehab.  Procedures: EGD 05/22/21: Demonstrating gastritis, along with gastric and duodenal ulcer presumed to be induced by the use of NSAIDs.  Consultations: GI service.  Discharge Exam: Vitals:   05/23/21 2202 05/24/21 0505  BP:  105/79  Pulse:  68  Resp:  18  Temp:  98.1 F (36.7 C)  SpO2: 94% 99%   General exam: Alert, awake, oriented x 2; in no acute distress; denies chest pain, no nausea, no vomiting.  No operative bleeding reported. Respiratory system: Clear to auscultation. Respiratory effort normal.  2 L nasal cannula supplementation in place.  No using accessory muscles. Cardiovascular system:RRR. No murmurs, rubs, gallops. Gastrointestinal system: Abdomen is nondistended, soft and nontender. No organomegaly or masses felt. Normal bowel sounds heard. Central nervous system: Alert and oriented. No focal neurological deficits. Extremities: No cyanosis or clubbing.  Right hip postoperative wound clean, intact and dry.  Healing appropriately. Skin: No petechiae. Psychiatry: Mood & affect appropriate.   Discharge Instructions   Discharge Instructions     Diet - low sodium heart healthy   Complete by: As directed    Discharge instructions   Complete by: As directed    Follow heart healthy diet Maintain adequate hydration Avoid the use of NSAIDs Repeat CBC in 10 days to assure hemoglobin stability Outpatient follow-up with gastroenterology service in 3-4 weeks. Patient will be taking Protonix twice a day for the next 61-month and then daily; also continue  the use of Carafate 4 times a day. Outpatient follow-up with orthopedic service as previously instructed Resume physical therapy rehabilitation and conditioning as per skilled nursing facility protocol.   Increase activity slowly   Complete by: As directed    No wound care   Complete by: As directed       Allergies as of 05/24/2021       Reactions   Penicillins Rash        Medication List     STOP taking these medications    aspirin 325 MG EC tablet   HYDROcodone-acetaminophen 5-325 MG tablet Commonly known as: NORCO/VICODIN   predniSONE 50 MG tablet Commonly known as: DELTASONE   Trelegy Ellipta 100-62.5-25 MCG/INH Aepb Generic drug: Fluticasone-Umeclidin-Vilant       TAKE these medications    acetaminophen 500 MG tablet Commonly known as: TYLENOL Take 2 tablets (1,000 mg total) by mouth every 8 (eight) hours as needed (pain, Headache and/or Fever >/= 101).   albuterol 108 (90 Base) MCG/ACT inhaler Commonly known as: VENTOLIN HFA Inhale 2 puffs into the lungs every 6 (six) hours as needed for wheezing or shortness of breath.   ALPRAZolam 0.5 MG tablet Commonly known as: XANAX Take 1 tablet (0.5 mg total) by mouth every morning.   atorvastatin 10 MG tablet Commonly known as: LIPITOR Take 10 mg by mouth daily.   citalopram 10 MG tablet Commonly known as: CELEXA Take 10 mg by mouth daily.  donepezil 5 MG tablet Commonly known as: ARICEPT Take 5 mg by mouth at bedtime.   ipratropium-albuterol 0.5-2.5 (3) MG/3ML Soln Commonly known as: DUONEB Take 3 mLs by nebulization every 4 (four) hours as needed. What changed: reasons to take this   levothyroxine 75 MCG tablet Commonly known as: SYNTHROID Take 1 tablet (75 mcg total) by mouth daily before breakfast.   melatonin 5 MG Tabs Take 5 mg by mouth at bedtime.   memantine 10 MG tablet Commonly known as: NAMENDA TAKE (1) TABLET BY MOUTH TWICE DAILY. What changed: See the new instructions.    multivitamin with minerals tablet Take 1 tablet by mouth daily.   pantoprazole 40 MG tablet Commonly known as: Protonix Take 1 tablet (40 mg total) by mouth 2 (two) times daily.   Spiriva Respimat 2.5 MCG/ACT Aers Generic drug: Tiotropium Bromide Monohydrate Inhale 2 puffs into the lungs daily.   sucralfate 1 GM/10ML suspension Commonly known as: CARAFATE Take 10 mLs (1 g total) by mouth 4 (four) times daily.   umeclidinium-vilanterol 62.5-25 MCG/INH Aepb Commonly known as: ANORO ELLIPTA Inhale 1 puff into the lungs at bedtime.   Vitamin D3 25 MCG (1000 UT) Caps Take 1,000 Units by mouth daily.       Allergies  Allergen Reactions   Penicillins Rash    Follow-up Information     [name], MD. Schedule an appointment as soon as possible for a visit in 2 week(s).   Specialty: Internal Medicine Why: after discharge from SNF Contact information: [facility] [phone]         Montez Morita, Quillian Quince, MD. Schedule an appointment as soon as possible for a visit in 3 week(s).   Specialty: Gastroenterology Contact information: [address] [place] [address] [phone]                  The results of significant diagnostics from this hospitalization (including imaging, microbiology, ancillary and laboratory) are listed below for reference.    Significant Diagnostic Studies: DG Chest 1 View  Result Date: 05/15/2021 CLINICAL DATA:  Right hip pain following fall, initial encounter EXAM: CHEST  1 VIEW COMPARISON:  06/20/2020 FINDINGS: Cardiac shadow is within normal limits. The lungs are well aerated bilaterally. No focal infiltrate or sizable effusion is seen. Bony abnormality is noted IMPRESSION: No active disease. Electronically Signed   By: Inez Catalina M.D.   On: 05/15/2021 16:21   DG Pelvis 1-2 Views  Result Date: 05/16/2021 CLINICAL DATA:  Post IM nail RIGHT femur EXAM: PELVIS - 1-2 VIEW  COMPARISON:  Portable exam 1356 hours compared to earlier intraoperative images of 05/16/2021 FINDINGS: IM nail with compression screw at RIGHT femur across a reduced intertrochanteric fracture. Old IM nail with locking screw at proximal LEFT femur. Bones demineralized. Hip and SI joint spaces preserved. No additional fracture or dislocation seen. Atherosclerotic calcifications aorta and iliac arteries. IMPRESSION: Post nailing of intertrochanteric fracture RIGHT femur. Aortic Atherosclerosis (ICD10-I70.0). Electronically Signed   By: Lavonia Dana M.D.   On: 05/16/2021 14:19   DG Pelvis 1-2 Views  Result Date: 05/15/2021 CLINICAL DATA:  Recent fall with pelvic pain on the right, initial encounter EXAM: PELVIS - 1 VIEW COMPARISON:  None. FINDINGS: Comminuted right intratrochanteric fracture is noted with mild impaction at the fracture site. Medullary rod is noted within the proximal left femur. Pelvic ring appears intact. No soft tissue abnormality is noted. IMPRESSION: Right intratrochanteric femoral fracture. Electronically Signed  By: Inez Catalina M.D.   On: 05/15/2021 16:20   DG Chest Portable 1 View  Result Date: 05/21/2021 CLINICAL DATA:  Shortness of breath. EXAM: PORTABLE CHEST 1 VIEW COMPARISON:  May 15, 2021 FINDINGS: The cardiac silhouette is normal. Mediastinal contours appear intact. Tortuosity and calcific atherosclerotic disease of the aorta. There is no evidence of focal airspace consolidation, pleural effusion or pneumothorax. Osseous structures are without acute abnormality. Soft tissues are grossly normal. IMPRESSION: 1. No active disease. 2. Tortuosity and calcific atherosclerotic disease of the aorta. Electronically Signed   By: Fidela Salisbury M.D.   On: 05/21/2021 11:35   DG HIP OPERATIVE UNILAT W OR W/O PELVIS RIGHT  Result Date: 05/16/2021 CLINICAL DATA:  RIGHT hip fracture, ORIF EXAM: OPERATIVE RIGHT HIP (WITH PELVIS IF PERFORMED) 12 VIEWS TECHNIQUE: Fluoroscopic spot  image(s) were submitted for interpretation post-operatively. COMPARISON:  05/15/2021 FLUOROSCOPY TIME:  3 minutes 19 seconds FINDINGS: Images demonstrate placement of an IM nail with a compression screw across a intertrochanteric fracture of the RIGHT femur. Bones demineralized. No dislocation or additional fracture identified. IMPRESSION: Post ORIF of intertrochanteric fracture RIGHT femur Electronically Signed   By: Lavonia Dana M.D.   On: 05/16/2021 13:39   DG FEMUR, MIN 2 VIEWS RIGHT  Result Date: 05/16/2021 CLINICAL DATA:  Post RIGHT femoral nail placement EXAM: RIGHT FEMUR 2 VIEWS COMPARISON:  Portable exam 1357 hours compared intraoperative images of 05/16/2021 FINDINGS: IM nail with compression screw at proximal RIGHT femur across a reduced intertrochanteric fracture. Locking screw at mid femur. No additional fracture or dislocation identified. Joint space narrowing RIGHT knee. RIGHT hip joint space preserved. IMPRESSION: Post ORIF intertrochanteric fracture RIGHT femur. No acute complications. Electronically Signed   By: Lavonia Dana M.D.   On: 05/16/2021 14:20   DG FEMUR, MIN 2 VIEWS RIGHT  Result Date: 05/15/2021 CLINICAL DATA:  Recent with right hip pain, initial encounter EXAM: RIGHT FEMUR 2 VIEWS COMPARISON:  None. FINDINGS: Comminuted intratrochanteric fracture of the proximal right femur is noted. No dislocation is seen. Distal femur is within normal limits. No gross soft tissue abnormality is noted. IMPRESSION: Comminuted intratrochanteric right femoral fracture. Electronically Signed   By: Inez Catalina M.D.   On: 05/15/2021 16:20    Microbiology: Recent Results (from the past 240 hour(s))  Resp Panel by RT-PCR (Flu A&B, Covid) Nasopharyngeal Swab     Status: None   Collection Time: 05/15/21  3:12 PM   Specimen: Nasopharyngeal Swab; Nasopharyngeal(NP) swabs in vial transport medium  Result Value Ref Range Status   SARS Coronavirus 2 by RT PCR NEGATIVE NEGATIVE Final    Comment:  (NOTE) SARS-CoV-2 target nucleic acids are NOT DETECTED.  The SARS-CoV-2 RNA is generally detectable in upper respiratory specimens during the acute phase of infection. The lowest concentration of SARS-CoV-2 viral copies this assay can detect is 138 copies/mL. A negative result does not preclude SARS-Cov-2 infection and should not be used as the sole basis for treatment or other patient management decisions. A negative result may occur with  improper specimen collection/handling, submission of specimen other than nasopharyngeal swab, presence of viral mutation(s) within the areas targeted by this assay, and inadequate number of viral copies(<138 copies/mL). A negative result must be combined with clinical observations, patient history, and epidemiological information. The expected result is Negative.  Fact Sheet for Patients:  EntrepreneurPulse.com.au  Fact Sheet for Healthcare Providers:  IncredibleEmployment.be  This test is no t yet approved or cleared by the [place] FDA and  has  been authorized for detection and/or diagnosis of SARS-CoV-2 by FDA under an Emergency Use Authorization (EUA). This EUA will remain  in effect (meaning this test can be used) for the duration of the COVID-19 declaration under Section 564(b)(1) of the Act, 21 U.S.C.section 360bbb-3(b)(1), unless the authorization is terminated  or revoked sooner.       Influenza A by PCR NEGATIVE NEGATIVE Final   Influenza B by PCR NEGATIVE NEGATIVE Final    Comment: (NOTE) The Xpert Xpress SARS-CoV-2/FLU/RSV plus assay is intended as an aid in the diagnosis of influenza from Nasopharyngeal swab specimens and should not be used as a sole basis for treatment. Nasal washings and aspirates are unacceptable for Xpert Xpress SARS-CoV-2/FLU/RSV testing.  Fact Sheet for Patients: EntrepreneurPulse.com.au  Fact Sheet for Healthcare  Providers: IncredibleEmployment.be  This test is not yet approved or cleared by the [place] FDA and has been authorized for detection and/or diagnosis of SARS-CoV-2 by FDA under an Emergency Use Authorization (EUA). This EUA will remain in effect (meaning this test can be used) for the duration of the COVID-19 declaration under Section 564(b)(1) of the Act, 21 U.S.C. section 360bbb-3(b)(1), unless the authorization is terminated or revoked.  Performed at [facility], [address] [place], [place] [address]   Surgical pcr screen     Status: None   Collection Time: 05/16/21  7:05 AM  Result Value Ref Range Status   MRSA, PCR NEGATIVE NEGATIVE Final   Staphylococcus aureus NEGATIVE NEGATIVE Final    Comment: (NOTE) The Xpert SA Assay (FDA approved for NASAL specimens in patients 72 years of age and older), is one component of a comprehensive surveillance program. It is not intended to diagnose infection nor to guide or monitor treatment. Performed at [facility], [address], [place], [place] [address]   Resp Panel by RT-PCR (Flu A&B, Covid) Nasopharyngeal Swab     Status: None   Collection Time: 05/21/21  9:25 AM   Specimen: Nasopharyngeal Swab; Nasopharyngeal(NP) swabs in vial transport medium  Result Value Ref Range Status   SARS Coronavirus 2 by RT PCR NEGATIVE NEGATIVE Final    Comment: (NOTE) SARS-CoV-2 target nucleic acids are NOT DETECTED.  The SARS-CoV-2 RNA is generally detectable in upper respiratory specimens during the acute phase of infection. The lowest concentration of SARS-CoV-2 viral copies this assay can detect is 138 copies/mL. A negative result does not preclude SARS-Cov-2 infection and should not be used as the sole basis for treatment or other patient management decisions. A negative result may occur with  improper specimen collection/handling, submission of specimen other than nasopharyngeal swab, presence of viral mutation(s)  within the areas targeted by this assay, and inadequate number of viral copies(<138 copies/mL). A negative result must be combined with clinical observations, patient history, and epidemiological information. The expected result is Negative.  Fact Sheet for Patients:  EntrepreneurPulse.com.au  Fact Sheet for Healthcare Providers:  IncredibleEmployment.be  This test is no t yet approved or cleared by the [place] FDA and  has been authorized for detection and/or diagnosis of SARS-CoV-2 by FDA under an Emergency Use Authorization (EUA). This EUA will remain  in effect (meaning this test can be used) for the duration of the COVID-19 declaration under Section 564(b)(1) of the Act, 21 U.S.C.section 360bbb-3(b)(1), unless the authorization is terminated  or revoked sooner.       Influenza A by PCR NEGATIVE NEGATIVE Final   Influenza B by PCR NEGATIVE NEGATIVE Final    Comment: (NOTE) The  Xpert Xpress SARS-CoV-2/FLU/RSV plus assay is intended as an aid in the diagnosis of influenza from Nasopharyngeal swab specimens and should not be used as a sole basis for treatment. Nasal washings and aspirates are unacceptable for Xpert Xpress SARS-CoV-2/FLU/RSV testing.  Fact Sheet for Patients: EntrepreneurPulse.com.au  Fact Sheet for Healthcare Providers: IncredibleEmployment.be  This test is not yet approved or cleared by the [place] FDA and has been authorized for detection and/or diagnosis of SARS-CoV-2 by FDA under an Emergency Use Authorization (EUA). This EUA will remain in effect (meaning this test can be used) for the duration of the COVID-19 declaration under Section 564(b)(1) of the Act, 21 U.S.C. section 360bbb-3(b)(1), unless the authorization is terminated or revoked.  Performed at [facility], [address] [place], [place] [address]      Labs: Basic Metabolic Panel: Recent Labs  Lab  05/21/21 0902 05/22/21 0543 05/23/21 0524 05/24/21 0510  NA 132* 135 133* 132*  K 4.0 4.3 3.6 3.3*  CL 96* 105 102 102  CO2 27 24 24 23   GLUCOSE 144* 114* 85 99  BUN 40* 25* 18 10  CREATININE 0.61 0.48 0.59 0.55  CALCIUM 8.0* 7.5* 7.6* 7.8*   Liver Function Tests: Recent Labs  Lab 05/21/21 0902  AST 16  ALT 19  ALKPHOS 57  BILITOT 0.7  PROT 5.5*  ALBUMIN 2.6*   CBC: Recent Labs  Lab 05/18/21 0553 05/21/21 0902 05/22/21 0543 05/23/21 0524 05/24/21 0510  WBC 9.5 9.3 10.2 10.0 8.8  HGB 8.9* 7.3* 9.3* 9.1* 9.2*  HCT 27.8* 22.3* 29.7* 29.3* 28.5*  MCV 100.0 97.8 98.3 98.7 97.9  PLT 230 317 315 302 328    BNP (last 3 results) Recent Labs    05/21/21 0902  BNP 52.0    Signed:  Barton Dubois MD.  [facility] 05/24/2021, 12:56 PM

## 2021-05-24 NOTE — Care Management Important Message (Signed)
Important Message  Patient Details  Name: Laura Foster MRN: [id number] Date of Birth: February 05, 1946   Medicare Important Message Given:  Yes     Ihor Gully, LCSW 05/24/2021, 2:24 PM

## 2021-05-25 ENCOUNTER — Telehealth: Payer: Self-pay | Admitting: Gastroenterology

## 2021-05-25 NOTE — Telephone Encounter (Signed)
Please arrange hospital follow-up in 2-3 months. Dx: anemia, duodenal ulcers  [facility] Nurse: Please arrange CBC in 1 week. Dx: Anemia.

## 2021-05-26 ENCOUNTER — Encounter: Payer: Self-pay | Admitting: Gastroenterology

## 2021-05-26 NOTE — Telephone Encounter (Signed)
Tried to call pt. VM not set up.  

## 2021-05-29 ENCOUNTER — Encounter (HOSPITAL_COMMUNITY): Payer: Self-pay | Admitting: Internal Medicine

## 2021-05-29 DIAGNOSIS — F32A Depression, unspecified: Secondary | ICD-10-CM | POA: Diagnosis not present

## 2021-05-29 DIAGNOSIS — J441 Chronic obstructive pulmonary disease with (acute) exacerbation: Secondary | ICD-10-CM | POA: Diagnosis not present

## 2021-05-29 DIAGNOSIS — E039 Hypothyroidism, unspecified: Secondary | ICD-10-CM | POA: Diagnosis not present

## 2021-05-29 DIAGNOSIS — S72141D Displaced intertrochanteric fracture of right femur, subsequent encounter for closed fracture with routine healing: Secondary | ICD-10-CM | POA: Diagnosis not present

## 2021-06-01 ENCOUNTER — Telehealth: Payer: Self-pay | Admitting: Orthopedic Surgery

## 2021-06-01 ENCOUNTER — Encounter: Payer: Medicare Other | Admitting: Orthopedic Surgery

## 2021-06-01 DIAGNOSIS — R059 Cough, unspecified: Secondary | ICD-10-CM | POA: Diagnosis not present

## 2021-06-01 DIAGNOSIS — F32A Depression, unspecified: Secondary | ICD-10-CM | POA: Diagnosis not present

## 2021-06-01 DIAGNOSIS — J441 Chronic obstructive pulmonary disease with (acute) exacerbation: Secondary | ICD-10-CM | POA: Diagnosis not present

## 2021-06-01 DIAGNOSIS — S72141D Displaced intertrochanteric fracture of right femur, subsequent encounter for closed fracture with routine healing: Secondary | ICD-10-CM | POA: Diagnosis not present

## 2021-06-01 DIAGNOSIS — R600 Localized edema: Secondary | ICD-10-CM | POA: Diagnosis not present

## 2021-06-01 NOTE — Telephone Encounter (Signed)
Laura Foster for [employer] called at 11:47 and stated that when he went to pick up the patient, she refused to come.  He said she didn't feel good.  He just wanted to let us know.  Now my question is do we need to have someone at [facility] remove the stitches?     When does this patient need to be seen again?  Please advise  Thanks ]

## 2021-06-01 NOTE — Telephone Encounter (Signed)
Dr Althia Forts op note states Postoperative plan Weightbearing as tolerated Staples if present can be removed postop day 12-14 Anticoagulation for 28 days Follow-up visit at 4 weeks for x-rays and then x-rays at 6 weeks and 12 weeks  I would go with that,every time.  They have this information already if not we can send Postoperative plan Weightbearing as tolerated Staples if present can be removed postop day 12-14 Anticoagulation for 28 days Follow-up visit at 4 weeks for x-rays and then x-rays at 6 weeks and 12 weeks [id number]

## 2021-06-02 ENCOUNTER — Other Ambulatory Visit: Payer: Self-pay

## 2021-06-02 ENCOUNTER — Encounter (HOSPITAL_COMMUNITY): Payer: Self-pay

## 2021-06-02 ENCOUNTER — Inpatient Hospital Stay (HOSPITAL_COMMUNITY)
Admission: EM | Admit: 2021-06-02 | Discharge: 2021-06-09 | DRG: 377 | Disposition: A | Discharge status: Skilled Nursing Facility | Payer: Medicare Other | Source: Skilled Nursing Facility | Attending: Internal Medicine | Admitting: Internal Medicine

## 2021-06-02 DIAGNOSIS — Z87891 Personal history of nicotine dependence: Secondary | ICD-10-CM | POA: Diagnosis not present

## 2021-06-02 DIAGNOSIS — Z79890 Hormone replacement therapy (postmenopausal): Secondary | ICD-10-CM

## 2021-06-02 DIAGNOSIS — I4581 Long QT syndrome: Secondary | ICD-10-CM | POA: Diagnosis not present

## 2021-06-02 DIAGNOSIS — R0602 Shortness of breath: Secondary | ICD-10-CM | POA: Diagnosis not present

## 2021-06-02 DIAGNOSIS — R412 Retrograde amnesia: Secondary | ICD-10-CM | POA: Diagnosis not present

## 2021-06-02 DIAGNOSIS — K922 Gastrointestinal hemorrhage, unspecified: Secondary | ICD-10-CM

## 2021-06-02 DIAGNOSIS — E871 Hypo-osmolality and hyponatremia: Secondary | ICD-10-CM

## 2021-06-02 DIAGNOSIS — E785 Hyperlipidemia, unspecified: Secondary | ICD-10-CM

## 2021-06-02 DIAGNOSIS — Z8249 Family history of ischemic heart disease and other diseases of the circulatory system: Secondary | ICD-10-CM | POA: Diagnosis not present

## 2021-06-02 DIAGNOSIS — J9601 Acute respiratory failure with hypoxia: Secondary | ICD-10-CM | POA: Diagnosis present

## 2021-06-02 DIAGNOSIS — Z96641 Presence of right artificial hip joint: Secondary | ICD-10-CM | POA: Diagnosis present

## 2021-06-02 DIAGNOSIS — D75839 Thrombocytosis, unspecified: Secondary | ICD-10-CM

## 2021-06-02 DIAGNOSIS — E8809 Other disorders of plasma-protein metabolism, not elsewhere classified: Secondary | ICD-10-CM

## 2021-06-02 DIAGNOSIS — T39395A Adverse effect of other nonsteroidal anti-inflammatory drugs [NSAID], initial encounter: Secondary | ICD-10-CM | POA: Diagnosis present

## 2021-06-02 DIAGNOSIS — D539 Nutritional anemia, unspecified: Secondary | ICD-10-CM | POA: Diagnosis not present

## 2021-06-02 DIAGNOSIS — Z880 Allergy status to penicillin: Secondary | ICD-10-CM | POA: Diagnosis not present

## 2021-06-02 DIAGNOSIS — M6281 Muscle weakness (generalized): Secondary | ICD-10-CM | POA: Diagnosis not present

## 2021-06-02 DIAGNOSIS — D62 Acute posthemorrhagic anemia: Secondary | ICD-10-CM | POA: Diagnosis present

## 2021-06-02 DIAGNOSIS — F418 Other specified anxiety disorders: Secondary | ICD-10-CM

## 2021-06-02 DIAGNOSIS — R0600 Dyspnea, unspecified: Secondary | ICD-10-CM

## 2021-06-02 DIAGNOSIS — J811 Chronic pulmonary edema: Secondary | ICD-10-CM | POA: Diagnosis not present

## 2021-06-02 DIAGNOSIS — K260 Acute duodenal ulcer with hemorrhage: Secondary | ICD-10-CM | POA: Diagnosis not present

## 2021-06-02 DIAGNOSIS — E039 Hypothyroidism, unspecified: Secondary | ICD-10-CM

## 2021-06-02 DIAGNOSIS — Z743 Need for continuous supervision: Secondary | ICD-10-CM | POA: Diagnosis not present

## 2021-06-02 DIAGNOSIS — R109 Unspecified abdominal pain: Secondary | ICD-10-CM

## 2021-06-02 DIAGNOSIS — Z9889 Other specified postprocedural states: Secondary | ICD-10-CM | POA: Diagnosis not present

## 2021-06-02 DIAGNOSIS — E782 Mixed hyperlipidemia: Secondary | ICD-10-CM | POA: Diagnosis not present

## 2021-06-02 DIAGNOSIS — Z833 Family history of diabetes mellitus: Secondary | ICD-10-CM

## 2021-06-02 DIAGNOSIS — Z79899 Other long term (current) drug therapy: Secondary | ICD-10-CM

## 2021-06-02 DIAGNOSIS — R413 Other amnesia: Secondary | ICD-10-CM | POA: Diagnosis present

## 2021-06-02 DIAGNOSIS — R9431 Abnormal electrocardiogram [ECG] [EKG]: Secondary | ICD-10-CM | POA: Diagnosis present

## 2021-06-02 DIAGNOSIS — Z720 Tobacco use: Secondary | ICD-10-CM | POA: Diagnosis not present

## 2021-06-02 DIAGNOSIS — F419 Anxiety disorder, unspecified: Secondary | ICD-10-CM

## 2021-06-02 DIAGNOSIS — J449 Chronic obstructive pulmonary disease, unspecified: Secondary | ICD-10-CM | POA: Diagnosis present

## 2021-06-02 DIAGNOSIS — R2689 Other abnormalities of gait and mobility: Secondary | ICD-10-CM | POA: Diagnosis not present

## 2021-06-02 DIAGNOSIS — K269 Duodenal ulcer, unspecified as acute or chronic, without hemorrhage or perforation: Secondary | ICD-10-CM | POA: Diagnosis not present

## 2021-06-02 DIAGNOSIS — K259 Gastric ulcer, unspecified as acute or chronic, without hemorrhage or perforation: Secondary | ICD-10-CM | POA: Diagnosis not present

## 2021-06-02 DIAGNOSIS — F0390 Unspecified dementia without behavioral disturbance: Secondary | ICD-10-CM | POA: Diagnosis present

## 2021-06-02 DIAGNOSIS — F32A Depression, unspecified: Secondary | ICD-10-CM | POA: Diagnosis not present

## 2021-06-02 DIAGNOSIS — Z809 Family history of malignant neoplasm, unspecified: Secondary | ICD-10-CM | POA: Diagnosis not present

## 2021-06-02 DIAGNOSIS — Z20822 Contact with and (suspected) exposure to covid-19: Secondary | ICD-10-CM | POA: Diagnosis not present

## 2021-06-02 DIAGNOSIS — K263 Acute duodenal ulcer without hemorrhage or perforation: Secondary | ICD-10-CM | POA: Diagnosis not present

## 2021-06-02 DIAGNOSIS — R279 Unspecified lack of coordination: Secondary | ICD-10-CM | POA: Diagnosis not present

## 2021-06-02 DIAGNOSIS — J441 Chronic obstructive pulmonary disease with (acute) exacerbation: Secondary | ICD-10-CM | POA: Diagnosis not present

## 2021-06-02 DIAGNOSIS — Z7401 Bed confinement status: Secondary | ICD-10-CM | POA: Diagnosis not present

## 2021-06-02 LAB — COMPREHENSIVE METABOLIC PANEL
ALT: 19 U/L (ref 0–44)
AST: 25 U/L (ref 15–41)
Albumin: 2.6 g/dL — ABNORMAL LOW (ref 3.5–5.0)
Alkaline Phosphatase: 101 U/L (ref 38–126)
Anion gap: 8 (ref 5–15)
BUN: 11 mg/dL (ref 8–23)
CO2: 26 mmol/L (ref 22–32)
Calcium: 7.7 mg/dL — ABNORMAL LOW (ref 8.9–10.3)
Chloride: 97 mmol/L — ABNORMAL LOW (ref 98–111)
Creatinine, Ser: 0.5 mg/dL (ref 0.44–1.00)
GFR, Estimated: >60 mL/min (ref >60)
Glucose, Bld: 109 mg/dL — ABNORMAL HIGH (ref 70–99)
Potassium: 3.6 mmol/L (ref 3.5–5.1)
Sodium: 131 mmol/L — ABNORMAL LOW (ref 135–145)
Total Bilirubin: 0.9 mg/dL (ref 0.3–1.2)
Total Protein: 5.4 g/dL — ABNORMAL LOW (ref 6.5–8.1)

## 2021-06-02 LAB — CBC
HCT: 19.7 % — ABNORMAL LOW (ref 36.0–46.0)
Hemoglobin: 5.9 g/dL — CL (ref 12.0–15.0)
MCH: 30.9 pg (ref 26.0–34.0)
MCHC: 29.9 g/dL — ABNORMAL LOW (ref 30.0–36.0)
MCV: 103.1 fL — ABNORMAL HIGH (ref 80.0–100.0)
Platelets: 454 10*3/uL — ABNORMAL HIGH (ref 150–400)
RBC: 1.91 MIL/uL — ABNORMAL LOW (ref 3.87–5.11)
RDW: 15.9 % — ABNORMAL HIGH (ref 11.5–15.5)
WBC: 8.9 10*3/uL (ref 4.0–10.5)
nRBC: 0 % (ref 0.0–0.2)

## 2021-06-02 LAB — PROTIME-INR
INR: 1 (ref 0.8–1.2)
Prothrombin Time: 13.2 seconds (ref 11.4–15.2)

## 2021-06-02 LAB — PREPARE RBC (CROSSMATCH)

## 2021-06-02 MED ORDER — PANTOPRAZOLE 80MG IVPB - SIMPLE MED
80.0000 mg | Freq: Once | INTRAVENOUS | Status: AC
  Administered 2021-06-03: 80 mg via INTRAVENOUS
  Filled 2021-06-02: qty 100

## 2021-06-02 MED ORDER — SODIUM CHLORIDE 0.9 % IV SOLN
10.0000 mL/h | Freq: Once | INTRAVENOUS | Status: AC
  Administered 2021-06-03: 10 mL/h via INTRAVENOUS

## 2021-06-02 MED ORDER — PANTOPRAZOLE SODIUM 40 MG IV SOLR: 40.0000 mg | Freq: Two times a day (BID) | INTRAVENOUS | Status: DC

## 2021-06-02 MED ORDER — PANTOPRAZOLE INFUSION (NEW) - SIMPLE MED
8.0000 mg/h | INTRAVENOUS | Status: AC
  Administered 2021-06-03 – 2021-06-04 (×4): 8 mg/h via INTRAVENOUS
  Filled 2021-06-02 (×14): qty 100

## 2021-06-02 NOTE — ED Triage Notes (Signed)
Pt to er room number 7 via ems, per ems pt is here for a d dimer of 7.8 and a hemoglobin of 5, states that she was sent from [facility] for a blood transfusion.  Pt presents on 4L O2 via [place].  Pt states that nothing is bothering her, states that she doesn't want to be here.  Pt denies pain.  "I feel fine"

## 2021-06-02 NOTE — Telephone Encounter (Signed)
I called and spoke to Laura Foster, Therapist, sports at [facility]  She will double check the post operative plan and remove the sutures.  I then spoke to [place] who is the scheduler for their transportation.  We have scheduled Laura Foster an appointment with Dr. Aline Brochure for Wednesday, 06/14/21 at 8:30.

## 2021-06-02 NOTE — ED Notes (Signed)
Assisted with rectal exam at this time with Dr. Betsey Holiday.
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
## 2021-06-03 ENCOUNTER — Inpatient Hospital Stay (HOSPITAL_COMMUNITY): Payer: Medicare Other

## 2021-06-03 ENCOUNTER — Observation Stay (HOSPITAL_COMMUNITY): Payer: Medicare Other | Admitting: Anesthesiology

## 2021-06-03 ENCOUNTER — Encounter (HOSPITAL_COMMUNITY)
Admission: EM | Disposition: A | Discharge status: Skilled Nursing Facility | Payer: Self-pay | Source: Skilled Nursing Facility | Attending: Internal Medicine

## 2021-06-03 DIAGNOSIS — F0390 Unspecified dementia without behavioral disturbance: Secondary | ICD-10-CM

## 2021-06-03 DIAGNOSIS — E8809 Other disorders of plasma-protein metabolism, not elsewhere classified: Secondary | ICD-10-CM | POA: Diagnosis present

## 2021-06-03 DIAGNOSIS — Z87891 Personal history of nicotine dependence: Secondary | ICD-10-CM | POA: Diagnosis not present

## 2021-06-03 DIAGNOSIS — Z96641 Presence of right artificial hip joint: Secondary | ICD-10-CM | POA: Diagnosis present

## 2021-06-03 DIAGNOSIS — E785 Hyperlipidemia, unspecified: Secondary | ICD-10-CM | POA: Diagnosis present

## 2021-06-03 DIAGNOSIS — D539 Nutritional anemia, unspecified: Secondary | ICD-10-CM | POA: Diagnosis present

## 2021-06-03 DIAGNOSIS — Z20822 Contact with and (suspected) exposure to covid-19: Secondary | ICD-10-CM | POA: Diagnosis present

## 2021-06-03 DIAGNOSIS — F418 Other specified anxiety disorders: Secondary | ICD-10-CM

## 2021-06-03 DIAGNOSIS — D75839 Thrombocytosis, unspecified: Secondary | ICD-10-CM

## 2021-06-03 DIAGNOSIS — F419 Anxiety disorder, unspecified: Secondary | ICD-10-CM | POA: Diagnosis present

## 2021-06-03 DIAGNOSIS — E782 Mixed hyperlipidemia: Secondary | ICD-10-CM | POA: Diagnosis not present

## 2021-06-03 DIAGNOSIS — D62 Acute posthemorrhagic anemia: Secondary | ICD-10-CM | POA: Diagnosis present

## 2021-06-03 DIAGNOSIS — Z880 Allergy status to penicillin: Secondary | ICD-10-CM | POA: Diagnosis not present

## 2021-06-03 DIAGNOSIS — Z79890 Hormone replacement therapy (postmenopausal): Secondary | ICD-10-CM | POA: Diagnosis not present

## 2021-06-03 DIAGNOSIS — R9431 Abnormal electrocardiogram [ECG] [EKG]: Secondary | ICD-10-CM | POA: Diagnosis present

## 2021-06-03 DIAGNOSIS — J449 Chronic obstructive pulmonary disease, unspecified: Secondary | ICD-10-CM | POA: Diagnosis present

## 2021-06-03 DIAGNOSIS — E039 Hypothyroidism, unspecified: Secondary | ICD-10-CM

## 2021-06-03 DIAGNOSIS — Z79899 Other long term (current) drug therapy: Secondary | ICD-10-CM | POA: Diagnosis not present

## 2021-06-03 DIAGNOSIS — K263 Acute duodenal ulcer without hemorrhage or perforation: Secondary | ICD-10-CM | POA: Diagnosis not present

## 2021-06-03 DIAGNOSIS — Z9889 Other specified postprocedural states: Secondary | ICD-10-CM | POA: Diagnosis not present

## 2021-06-03 DIAGNOSIS — E871 Hypo-osmolality and hyponatremia: Secondary | ICD-10-CM | POA: Diagnosis present

## 2021-06-03 DIAGNOSIS — R413 Other amnesia: Secondary | ICD-10-CM | POA: Diagnosis present

## 2021-06-03 DIAGNOSIS — Z7401 Bed confinement status: Secondary | ICD-10-CM | POA: Diagnosis not present

## 2021-06-03 DIAGNOSIS — F32A Depression, unspecified: Secondary | ICD-10-CM | POA: Diagnosis present

## 2021-06-03 DIAGNOSIS — R279 Unspecified lack of coordination: Secondary | ICD-10-CM | POA: Diagnosis not present

## 2021-06-03 DIAGNOSIS — J811 Chronic pulmonary edema: Secondary | ICD-10-CM | POA: Diagnosis not present

## 2021-06-03 DIAGNOSIS — Z8249 Family history of ischemic heart disease and other diseases of the circulatory system: Secondary | ICD-10-CM | POA: Diagnosis not present

## 2021-06-03 DIAGNOSIS — Z809 Family history of malignant neoplasm, unspecified: Secondary | ICD-10-CM | POA: Diagnosis not present

## 2021-06-03 DIAGNOSIS — R0602 Shortness of breath: Secondary | ICD-10-CM | POA: Diagnosis not present

## 2021-06-03 DIAGNOSIS — J9601 Acute respiratory failure with hypoxia: Secondary | ICD-10-CM | POA: Diagnosis present

## 2021-06-03 DIAGNOSIS — K260 Acute duodenal ulcer with hemorrhage: Secondary | ICD-10-CM | POA: Diagnosis present

## 2021-06-03 DIAGNOSIS — Z833 Family history of diabetes mellitus: Secondary | ICD-10-CM | POA: Diagnosis not present

## 2021-06-03 HISTORY — PX: ESOPHAGOGASTRODUODENOSCOPY (EGD) WITH PROPOFOL: SHX5813

## 2021-06-03 LAB — COMPREHENSIVE METABOLIC PANEL
ALT: 18 U/L (ref 0–44)
AST: 19 U/L (ref 15–41)
Albumin: 2.5 g/dL — ABNORMAL LOW (ref 3.5–5.0)
Alkaline Phosphatase: 98 U/L (ref 38–126)
Anion gap: 7 (ref 5–15)
BUN: 9 mg/dL (ref 8–23)
CO2: 29 mmol/L (ref 22–32)
Calcium: 7.9 mg/dL — ABNORMAL LOW (ref 8.9–10.3)
Chloride: 97 mmol/L — ABNORMAL LOW (ref 98–111)
Creatinine, Ser: 0.42 mg/dL — ABNORMAL LOW (ref 0.44–1.00)
GFR, Estimated: >60 mL/min (ref >60)
Glucose, Bld: 87 mg/dL (ref 70–99)
Potassium: 3.9 mmol/L (ref 3.5–5.1)
Sodium: 133 mmol/L — ABNORMAL LOW (ref 135–145)
Total Bilirubin: 1.3 mg/dL — ABNORMAL HIGH (ref 0.3–1.2)
Total Protein: 5.2 g/dL — ABNORMAL LOW (ref 6.5–8.1)

## 2021-06-03 LAB — CBC
HCT: 27.7 % — ABNORMAL LOW (ref 36.0–46.0)
Hemoglobin: 8.7 g/dL — ABNORMAL LOW (ref 12.0–15.0)
MCH: 30.9 pg (ref 26.0–34.0)
MCHC: 31.4 g/dL (ref 30.0–36.0)
MCV: 98.2 fL (ref 80.0–100.0)
Platelets: 408 10*3/uL — ABNORMAL HIGH (ref 150–400)
RBC: 2.82 MIL/uL — ABNORMAL LOW (ref 3.87–5.11)
RDW: 14.7 % (ref 11.5–15.5)
WBC: 8.5 10*3/uL (ref 4.0–10.5)
nRBC: 0 % (ref 0.0–0.2)

## 2021-06-03 LAB — APTT: aPTT: 34 seconds (ref 24–36)

## 2021-06-03 LAB — RESP PANEL BY RT-PCR (FLU A&B, COVID) ARPGX2
Influenza A by PCR: NEGATIVE
Influenza B by PCR: NEGATIVE
SARS Coronavirus 2 by RT PCR: NEGATIVE

## 2021-06-03 LAB — FOLATE: Folate: 9.7 ng/mL (ref >5.9)

## 2021-06-03 LAB — VITAMIN B12: Vitamin B-12: 319 pg/mL (ref 180–914)

## 2021-06-03 LAB — PHOSPHORUS: Phosphorus: 3.1 mg/dL (ref 2.5–4.6)

## 2021-06-03 LAB — PROTIME-INR
INR: 1 (ref 0.8–1.2)
Prothrombin Time: 13.2 seconds (ref 11.4–15.2)

## 2021-06-03 LAB — MAGNESIUM: Magnesium: 2 mg/dL (ref 1.7–2.4)

## 2021-06-03 SURGERY — ESOPHAGOGASTRODUODENOSCOPY (EGD) WITH PROPOFOL
Anesthesia: General

## 2021-06-03 MED ORDER — PHENYLEPHRINE 40 MCG/ML (10ML) SYRINGE FOR IV PUSH (FOR BLOOD PRESSURE SUPPORT)
PREFILLED_SYRINGE | INTRAVENOUS | Status: DC | PRN
  Administered 2021-06-03: 80 ug via INTRAVENOUS

## 2021-06-03 MED ORDER — ONDANSETRON HCL 4 MG/2ML IJ SOLN
INTRAMUSCULAR | Status: AC
  Filled 2021-06-03: qty 2

## 2021-06-03 MED ORDER — ENSURE ENLIVE PO LIQD
237.0000 mL | Freq: Two times a day (BID) | ORAL | Status: DC
  Administered 2021-06-04 – 2021-06-09 (×5): 237 mL via ORAL

## 2021-06-03 MED ORDER — IPRATROPIUM-ALBUTEROL 0.5-2.5 (3) MG/3ML IN SOLN
RESPIRATORY_TRACT | Status: AC
  Filled 2021-06-03: qty 3

## 2021-06-03 MED ORDER — SODIUM CHLORIDE 0.9 % IV SOLN: INTRAVENOUS | Status: DC

## 2021-06-03 MED ORDER — PROPOFOL 10 MG/ML IV BOLUS
INTRAVENOUS | Status: DC | PRN
  Administered 2021-06-03: 100 mg via INTRAVENOUS

## 2021-06-03 MED ORDER — MELATONIN 3 MG PO TABS
3.0000 mg | ORAL_TABLET | Freq: Once | ORAL | Status: AC
  Administered 2021-06-04: 3 mg via ORAL
  Filled 2021-06-03: qty 1

## 2021-06-03 MED ORDER — FENTANYL CITRATE (PF) 100 MCG/2ML IJ SOLN
INTRAMUSCULAR | Status: AC
  Filled 2021-06-03: qty 2

## 2021-06-03 MED ORDER — LACTATED RINGERS IV SOLN: INTRAVENOUS | Status: DC

## 2021-06-03 MED ORDER — IPRATROPIUM-ALBUTEROL 0.5-2.5 (3) MG/3ML IN SOLN: 3.0000 mL | Freq: Once | RESPIRATORY_TRACT | Status: DC

## 2021-06-03 MED ORDER — POTASSIUM CHLORIDE 10 MEQ/100ML IV SOLN
10.0000 meq | INTRAVENOUS | Status: AC
  Administered 2021-06-03 (×2): 10 meq via INTRAVENOUS
  Filled 2021-06-03 (×2): qty 100

## 2021-06-03 MED ORDER — FENTANYL CITRATE (PF) 100 MCG/2ML IJ SOLN
50.0000 ug | INTRAMUSCULAR | Status: DC | PRN
Start: 2021-06-03 — End: 2021-06-03
  Administered 2021-06-03: 50 ug via INTRAVENOUS

## 2021-06-03 MED ORDER — IPRATROPIUM-ALBUTEROL 0.5-2.5 (3) MG/3ML IN SOLN: 3.0000 mL | Freq: Four times a day (QID) | RESPIRATORY_TRACT | Status: DC | PRN

## 2021-06-03 MED ORDER — UMECLIDINIUM-VILANTEROL 62.5-25 MCG/INH IN AEPB
1.0000 | INHALATION_SPRAY | Freq: Every day | RESPIRATORY_TRACT | Status: DC
  Administered 2021-06-03 – 2021-06-08 (×6): 1 via RESPIRATORY_TRACT
  Filled 2021-06-03: qty 14

## 2021-06-03 MED ORDER — LIDOCAINE VISCOUS HCL 2 % MT SOLN
15.0000 mL | Freq: Once | OROMUCOSAL | Status: AC
  Administered 2021-06-03: 10 mL via OROMUCOSAL

## 2021-06-03 MED ORDER — ALBUTEROL SULFATE HFA 108 (90 BASE) MCG/ACT IN AERS: 2.0000 | INHALATION_SPRAY | Freq: Four times a day (QID) | RESPIRATORY_TRACT | Status: DC | PRN

## 2021-06-03 MED ORDER — ATORVASTATIN CALCIUM 10 MG PO TABS
10.0000 mg | ORAL_TABLET | Freq: Every day | ORAL | Status: DC
  Administered 2021-06-03 – 2021-06-09 (×7): 10 mg via ORAL
  Filled 2021-06-03 (×7): qty 1

## 2021-06-03 MED ORDER — IPRATROPIUM-ALBUTEROL 0.5-2.5 (3) MG/3ML IN SOLN
RESPIRATORY_TRACT | Status: DC | PRN
  Administered 2021-06-03: 3 mL via RESPIRATORY_TRACT

## 2021-06-03 MED ORDER — ONDANSETRON HCL 4 MG/2ML IJ SOLN
4.0000 mg | Freq: Once | INTRAMUSCULAR | Status: AC
  Administered 2021-06-03: 4 mg via INTRAVENOUS

## 2021-06-03 MED ORDER — LEVOTHYROXINE SODIUM 75 MCG PO TABS
75.0000 ug | ORAL_TABLET | Freq: Every day | ORAL | Status: DC
  Administered 2021-06-03 – 2021-06-09 (×7): 75 ug via ORAL
  Filled 2021-06-03 (×7): qty 1

## 2021-06-03 MED ORDER — CITALOPRAM HYDROBROMIDE 20 MG PO TABS
10.0000 mg | ORAL_TABLET | Freq: Every day | ORAL | Status: DC
  Administered 2021-06-03 – 2021-06-09 (×7): 10 mg via ORAL
  Filled 2021-06-03 (×7): qty 1

## 2021-06-03 MED ORDER — IPRATROPIUM-ALBUTEROL 0.5-2.5 (3) MG/3ML IN SOLN: 3.0000 mL | RESPIRATORY_TRACT | Status: DC | PRN

## 2021-06-03 MED ORDER — ALPRAZOLAM 0.5 MG PO TABS
0.5000 mg | ORAL_TABLET | Freq: Every morning | ORAL | Status: DC
  Administered 2021-06-03 – 2021-06-09 (×7): 0.5 mg via ORAL
  Filled 2021-06-03 (×7): qty 1

## 2021-06-03 MED ORDER — LIDOCAINE HCL (CARDIAC) PF 100 MG/5ML IV SOSY
PREFILLED_SYRINGE | INTRAVENOUS | Status: DC | PRN
  Administered 2021-06-03: 60 mg via INTRATRACHEAL

## 2021-06-03 MED ORDER — SUCRALFATE 1 GM/10ML PO SUSP
1.0000 g | Freq: Three times a day (TID) | ORAL | Status: DC
  Administered 2021-06-03 – 2021-06-09 (×25): 1 g via ORAL
  Filled 2021-06-03 (×25): qty 10

## 2021-06-03 MED ORDER — ALBUTEROL SULFATE (2.5 MG/3ML) 0.083% IN NEBU: 2.5000 mg | INHALATION_SOLUTION | Freq: Four times a day (QID) | RESPIRATORY_TRACT | Status: DC | PRN

## 2021-06-03 MED ORDER — SODIUM CHLORIDE (PF) 0.9 % IJ SOLN
PREFILLED_SYRINGE | INTRAMUSCULAR | Status: DC | PRN
  Administered 2021-06-03: 3 mL

## 2021-06-03 MED ORDER — PROPOFOL 500 MG/50ML IV EMUL
INTRAVENOUS | Status: DC | PRN
  Administered 2021-06-03: 150 ug/kg/min via INTRAVENOUS

## 2021-06-03 NOTE — Anesthesia Preprocedure Evaluation (Addendum)
Anesthesia Evaluation  Patient identified by MRN, date of birth, ID band Patient awake    Reviewed: Allergy & Precautions, NPO status , Patient's Chart, lab work & pertinent test results  History of Anesthesia Complications Negative for: history of anesthetic complications  Airway Mallampati: II  TM Distance: >3 FB Neck ROM: Full    Dental  (+) Edentulous Upper, Edentulous Lower   Pulmonary shortness of breath and with exertion, COPD,  COPD inhaler, Current Smoker and Patient abstained from smoking., former smoker,     + wheezing      Cardiovascular Exercise Tolerance: Good Normal cardiovascular exam+ dysrhythmias (Prolonged QT)  Rhythm:Regular Rate:Normal  21-May-2021 08:54:54 [facility] ROUTINE RECORD 1946-03-07 (88 yr) Female Caucasian Room:ER9 Loc:499 Technician: MTD Test ind: Comment 1:: Comment 2:: Comment 3:: Comment 4:: Vent. rate 112 BPM PR interval 127 ms QRS duration 71 ms QT/QTcB 408/557 ms P-R-T axes 91 73 * Sinus tachycardia Atrial premature complex Consider right atrial enlargement Nonspecific repol abnormality, lateral leads Prolonged QT interval   Neuro/Psych PSYCHIATRIC DISORDERS Anxiety Depression Dementia    GI/Hepatic Neg liver ROS, PUD, neg GERD  Medicated,  Endo/Other  Hypothyroidism   Renal/GU Renal InsufficiencyRenal disease     Musculoskeletal negative musculoskeletal ROS (+)   Abdominal   Peds  Hematology  (+) anemia ,   Anesthesia Other Findings Duoneb nebulizer treatment was given   Reproductive/Obstetrics negative OB ROS                            Anesthesia Physical  Anesthesia Plan  ASA: 4 and emergent  Anesthesia Plan: General   Post-op Pain Management:    Induction: Intravenous  PONV Risk Score and Plan: Propofol infusion  Airway Management Planned: Nasal Cannula and Natural Airway  Additional Equipment:    Intra-op Plan:   Post-operative Plan:   Informed Consent: I have reviewed the patients History and Physical, chart, labs and discussed the procedure including the risks, benefits and alternatives for the proposed anesthesia with the patient or authorized representative who has indicated his/her understanding and acceptance.       Plan Discussed with: Surgeon  Anesthesia Plan Comments:        Anesthesia Quick Evaluation
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
## 2021-06-03 NOTE — Op Note (Signed)
[facility] Patient Name: Laura Foster Procedure Date: 06/03/2021 10:07 AM MRN: [id number] Date of Birth: 11/25/1946 Attending MD: Elon Alas. Abbey Chatters DO CSN: [id number] Age: 75 Admit Type: Inpatient Procedure:                Upper GI endoscopy Indications:              Acute post hemorrhagic anemia, Acute duodenal ulcer Providers:                Elon Alas. Abbey Chatters, DO, Lurline Del, RN, Wynonia Musty Tech, Technician Referring MD:              Medicines:                See the Anesthesia note for documentation of the                            administered medications Complications:            No immediate complications. Estimated Blood Loss:     Estimated blood loss was minimal. Procedure:                Pre-Anesthesia Assessment:                           - The anesthesia plan was to use monitored                            anesthesia care (MAC).                           After obtaining informed consent, the endoscope was                            passed under direct vision. Throughout the                            procedure, the patient's blood pressure, pulse, and                            oxygen saturations were monitored continuously. The                            [id number]-H190 ([id number]) scope was introduced through the                            mouth, and advanced to the second part of duodenum.                            The upper GI endoscopy was accomplished without                            difficulty. The patient tolerated the procedure  well. Scope In: 11:03:38 AM Scope Out: 11:13:55 AM Total Procedure Duration: 0 hours 10 minutes 17 seconds  Findings:      There is no endoscopic evidence of bleeding, areas of erosion,       esophagitis, hiatal hernia, ulcerations or varices in the entire       esophagus.      The entire examined stomach was normal.      One non-obstructing non-bleeding cratered duodenal  ulcer with a flat       pigmented spot (Forrest Class IIc) was found in the duodenal bulb. The       lesion was 10 mm in largest dimension. Appears smaller on todays exam.       There is no evidence of perforation. Area was successfully injected with       6 mL of a 1:10,000 solution of epinephrine. Coagulation for bleeding       prevention using bipolar probe was successful. Impression:               - Normal stomach.                           - Non-obstructing non-bleeding duodenal ulcer with                            a flat pigmented spot (Forrest Class IIc). NSAID                            induced etiology. There is no evidence of                            perforation. Injected. Treated with bipolar cautery.                           - No specimens collected. Moderate Sedation:      Per Anesthesia Care Recommendation:           - Return patient to hospital ward for ongoing care.                           - Full liquid diet today. Advance to soft tomorrow.                           - Give Protonix (pantoprazole): 8 mg/hr IV by                            continuous infusion.                           - Use sucralfate suspension 1 gram PO QID.                           - Continue to monitor Hgb and trasnfuse for <7                           - Avoid NSAIDs                           -  Ulcer appears to be healing, anemia likely due to                            slow ooze as no active bleeding identified today. Procedure Code(s):        --- Professional ---                           [phone], Esophagogastroduodenoscopy, flexible,                            transoral; with control of bleeding, any method Diagnosis Code(s):        --- Professional ---                           Y18.563J, Adverse effect of other nonsteroidal                            anti-inflammatory drugs [NSAID], sequela                           K26.9, Duodenal ulcer, unspecified as acute or                             chronic, without hemorrhage or perforation                           D62, Acute posthemorrhagic anemia                           K26.3, Acute duodenal ulcer without hemorrhage or                            perforation CPT copyright 2019 [employer]. All rights reserved. The codes documented in this report are preliminary and upon coder review may  be revised to meet current compliance requirements. Elon Alas. Abbey Chatters, DO [place] Abbey Chatters, DO 06/03/2021 11:23:12 AM This report has been signed electronically. Number of Addenda: 0

## 2021-06-03 NOTE — Transfer of Care (Signed)
Immediate Anesthesia Transfer of Care Note  Patient: VENETA SLITER  Procedure(s) Performed: ESOPHAGOGASTRODUODENOSCOPY (EGD) WITH PROPOFOL  Patient Location: PACU  Anesthesia Type:General  Level of Consciousness: awake, alert , oriented and sedated  Airway & Oxygen Therapy: Patient Spontanous Breathing and Patient connected to nasal cannula oxygen  Post-op Assessment: Report given to RN and Post -op Vital signs reviewed and stable  Post vital signs: Reviewed and stable  Last Vitals:  Vitals Value Taken Time  BP 135/88 06/03/21 1130  Temp 98.7   Pulse 94 06/03/21 1134  Resp 19 06/03/21 1134  SpO2 96 % 06/03/21 1134  Vitals shown include unvalidated device data.  Last Pain:  Vitals:   06/03/21 0900  TempSrc:   PainSc: 0-No pain         Complications: No notable events documented.

## 2021-06-03 NOTE — Progress Notes (Signed)
Laura Foster is a 75 y.o. female with medical history significant for COPD, hypothyroidism, hyperlipidemia, dementia, GI bleed and tobacco abuse who presents to the emergency department from [facility] center due to abnormal labs.  Routine labs drawn at the nursing center showed patient's hemoglobin at 5.9, this was 9.2 on 6/22, so she was sent to the ED for further evaluation.  Patient has undergone EGD on 7/2 with no active bleeding noted and duodenal ulcers actually appears smaller.  Given her profound anemia, however she has undergone gold probe coagulation and epinephrine injection.  She has been started on a full liquid diet today with plans for further advancement and likely discharge by tomorrow if hemoglobin remains stable.  She has received 2 units of PRBCs with hemoglobin improved to 8.1.  Discussed with Sister Ronalee Belts on phone 7/2.  Total care time: 30 minutes.

## 2021-06-03 NOTE — Interval H&P Note (Signed)
History and Physical Interval Note:  06/03/2021 10:28 AM  Laura Foster  has presented today for surgery, with the diagnosis of Acute blood loss anemia.  The various methods of treatment have been discussed with the patient and family. After consideration of risks, benefits and other options for treatment, the patient has consented to  Procedure(s): ESOPHAGOGASTRODUODENOSCOPY (EGD) WITH PROPOFOL (N/A) as a surgical intervention.  The patient's history has been reviewed, patient examined, no change in status, stable for surgery.  I have reviewed the patient's chart and labs.  Questions were answered to the patient's satisfaction.     [name]

## 2021-06-03 NOTE — Anesthesia Postprocedure Evaluation (Signed)
Anesthesia Post Note  Patient: Laura Foster  Procedure(s) Performed: ESOPHAGOGASTRODUODENOSCOPY (EGD) WITH PROPOFOL  Patient location during evaluation: PACU Anesthesia Type: General Level of consciousness: awake and alert and oriented Pain management: pain level controlled Vital Signs Assessment: post-procedure vital signs reviewed and stable Respiratory status: spontaneous breathing and respiratory function stable Cardiovascular status: blood pressure returned to baseline and stable Postop Assessment: no apparent nausea or vomiting Anesthetic complications: no   No notable events documented.   Last Vitals:  Vitals:   06/03/21 1138 06/03/21 1145  BP: 125/87 (!) 146/92  Pulse: 82 79  Resp: (!) 22 (!) 25  Temp:    SpO2: 96% 95%    Last Pain:  Vitals:   06/03/21 1150  TempSrc:   PainSc: 1                  Halbert Jesson C Jubilee Vivero
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
## 2021-06-04 LAB — CBC
HCT: 27.7 % — ABNORMAL LOW (ref 36.0–46.0)
Hemoglobin: 8.6 g/dL — ABNORMAL LOW (ref 12.0–15.0)
MCH: 30.9 pg (ref 26.0–34.0)
MCHC: 31 g/dL (ref 30.0–36.0)
MCV: 99.6 fL (ref 80.0–100.0)
Platelets: 367 10*3/uL (ref 150–400)
RBC: 2.78 MIL/uL — ABNORMAL LOW (ref 3.87–5.11)
RDW: 15.2 % (ref 11.5–15.5)
WBC: 6.6 10*3/uL (ref 4.0–10.5)
nRBC: 0 % (ref 0.0–0.2)

## 2021-06-04 LAB — BPAM RBC
Blood Product Expiration Date: 202208042359
Blood Product Expiration Date: 202208052359
ISSUE DATE / TIME: 202207020017
ISSUE DATE / TIME: 202207020248
Unit Type and Rh: 5100
Unit Type and Rh: 5100

## 2021-06-04 LAB — TYPE AND SCREEN
ABO/RH(D): O POS
Antibody Screen: NEGATIVE
Unit division: 0
Unit division: 0

## 2021-06-04 MED ORDER — FUROSEMIDE 10 MG/ML IJ SOLN
20.0000 mg | Freq: Once | INTRAMUSCULAR | Status: AC
  Administered 2021-06-04: 20 mg via INTRAVENOUS
  Filled 2021-06-04: qty 2

## 2021-06-04 MED ORDER — ONDANSETRON HCL 4 MG/2ML IJ SOLN: 4.0000 mg | Freq: Four times a day (QID) | INTRAMUSCULAR | Status: DC | PRN

## 2021-06-04 MED ORDER — ALPRAZOLAM 0.5 MG PO TABS: 0.5000 mg | ORAL_TABLET | Freq: Every morning | ORAL | 0 refills | Status: DC

## 2021-06-04 MED ORDER — ENSURE ENLIVE PO LIQD: 237.0000 mL | Freq: Two times a day (BID) | ORAL | 12 refills | Status: DC

## 2021-06-04 NOTE — Telephone Encounter (Signed)
Can we bump up this patient's appointment in our clinic to 6 to 8 weeks with a either an app or myself for hospital follow-up visit?  Thank you

## 2021-06-04 NOTE — Evaluation (Signed)
Physical Therapy Evaluation Patient Details Name: Laura Foster MRN: [id number] DOB: 21-Sep-1946 Today's Date: 06/04/2021   History of Present Illness  Laura Foster is a 75 y.o. female with medical history significant for COPD, hypothyroidism, hyperlipidemia, dementia, GI bleed and tobacco abuse who presents to the emergency department from [facility] center due to abnormal labs.  Routine labs drawn at the nursing center showed patient's hemoglobin at 5.9, this was 9.2 on 6/22, so she was sent to the ED for further evaluation.  Patient denies chest pain, nausea, vomiting, shortness of breath, rectal bleeding or noticing any black stools.  He was admitted on 05/21/2021 due to acute GI bleed during which she had EGD done on 05/22/2021 which showed gastritis, gastric and duodenal ulcer presumed to be NSAID induced.   Clinical Impression  Patient resting peacefully in bed, is awake, alert and cooperative. Patient shows good return with mobility tasks and demos Mod I with bed mobility and transfers. Patient able to stand from lowered bed position Mod I and using RW able to ambulate 25 feet to doorway and back. Patient does note slight fatigue. Patient returned to chair, set in reclined position and dining tray placed in front with phone and call bell in reach. Patient will benefit from continued physical therapy in hospital and recommended venue below to increase strength, balance, endurance for safe ADLs and gait.      Follow Up Recommendations SNF;Supervision/Assistance - 24 hour;Supervision for mobility/OOB    Equipment Recommendations  None recommended by PT    Recommendations for Other Services       Precautions / Restrictions Precautions Precautions: Fall Restrictions Weight Bearing Restrictions: No      Mobility  Bed Mobility Overal bed mobility: Modified Independent Bed Mobility: Sidelying to Sit     Supine to sit: Modified independent (Device/Increase time)     General bed  mobility comments: Slight labored movement, but able to do independent    Transfers Overall transfer level: Modified independent Equipment used: Rolling walker (2 wheeled) Transfers: Sit to/from Stand Sit to Stand: Modified independent (Device/Increase time)         General transfer comment: Slow moving, but shows good awareness and steadiness, able to do independent with increased time  Ambulation/Gait Ambulation/Gait assistance: Modified independent (Device/Increase time);Supervision Gait Distance (Feet): 25 Feet Assistive device: Rolling walker (2 wheeled) Gait Pattern/deviations: Decreased stride length;Decreased step length - left;Trunk flexed;Decreased weight shift to right     General Gait Details: Slow, but steady, favors RLE  Stairs            Wheelchair Mobility    Modified Rankin (Stroke Patients Only)       Balance Overall balance assessment: Modified Independent Sitting-balance support: Feet supported;No upper extremity supported Sitting balance-Leahy Scale: Fair Sitting balance - Comments: fair/good seated at EOB   Standing balance support: During functional activity;Bilateral upper extremity supported Standing balance-Leahy Scale: Fair Standing balance comment: using RW                             Pertinent Vitals/Pain Pain Assessment: No/denies pain    Home Living Family/patient expects to be discharged to:: Private residence (Patient states goal as return home, however she was admitted from SNF) Living Arrangements: Alone Available Help at Discharge: Family;Available PRN/intermittently Type of Home: House Home Access: Stairs to enter Entrance Stairs-Rails: None Entrance Stairs-Number of Steps: 2 Home Layout: One level Home Equipment: Walker - 2 wheels;Cane - single point;Shower  seat;Bedside commode      Prior Function Level of Independence: Independent   Gait / Transfers Assistance Needed: Patient reports PLOF as  independant     Comments: Patient states she lives alone but has sister and son nearby, and that they check in regularly     Hand Dominance        Extremity/Trunk Assessment   Upper Extremity Assessment Upper Extremity Assessment: Overall WFL for tasks assessed    Lower Extremity Assessment Lower Extremity Assessment: Generalized weakness       Communication   Communication: No difficulties  Cognition Arousal/Alertness: Awake/alert Behavior During Therapy: WFL for tasks assessed/performed Overall Cognitive Status: Within Functional Limits for tasks assessed                                        General Comments      Exercises     Assessment/Plan    PT Assessment Patient needs continued PT services  PT Problem List Decreased strength;Decreased activity tolerance;Decreased mobility       PT Treatment Interventions DME instruction;Gait training;Stair training;Functional mobility training;Therapeutic activities;Therapeutic exercise;Balance training;Patient/family education    PT Goals (Current goals can be found in the Care Plan section)  Acute Rehab PT Goals Patient Stated Goal: return home after rehab PT Goal Formulation: With patient Time For Goal Achievement: 06/18/21 Potential to Achieve Goals: Good    Frequency Min 3X/week   Barriers to discharge        Co-evaluation               AM-PAC PT "6 Clicks" Mobility  Outcome Measure Help needed turning from your back to your side while in a flat bed without using bedrails?: A Little Help needed moving from lying on your back to sitting on the side of a flat bed without using bedrails?: None Help needed moving to and from a bed to a chair (including a wheelchair)?: A Little Help needed standing up from a chair using your arms (e.g., wheelchair or bedside chair)?: A Little Help needed to walk in hospital room?: A Little Help needed climbing 3-5 steps with a railing? : A Lot 6 Click  Score: 18    End of Session   Activity Tolerance: Patient tolerated treatment well;Patient limited by fatigue Patient left: in chair;with call bell/phone within reach Nurse Communication: Mobility status PT Visit Diagnosis: Other abnormalities of gait and mobility (R26.89);Muscle weakness (generalized) (M62.81);Unsteadiness on feet (R26.81)    Time: 9147-8295 PT Time Calculation (min) (ACUTE ONLY): 25 min   Charges:   PT Evaluation $PT Eval Low Complexity: 1 Low PT Treatments $Therapeutic Activity: 8-22 mins      2:47 PM, 06/04/21 Josue Hector PT DPT  Physical Therapist with [facility]  [phone]

## 2021-06-04 NOTE — Progress Notes (Signed)
Subjective: Patient doing well today.  Tolerating soft diet without any problems.  No abdominal pain.  No melena hematochezia.  No nausea or vomiting.  Objective: Vital signs in last 24 hours: Temp:  [98 F (36.7 C)-98.7 F (37.1 C)] 98.1 F (36.7 C) (07/03 0551) Pulse Rate:  [63-84] 66 (07/03 0551) Resp:  [13-25] 18 (07/03 0551) BP: (90-158)/(62-92) 115/65 (07/03 0551) SpO2:  [91 %-99 %] 99 % (07/03 0855) Last BM Date: 06/02/21 General:   Alert and oriented, pleasant Head:  Normocephalic and atraumatic. Eyes:  No icterus, sclera clear. Conjuctiva pink.  Mouth:  Without lesions, mucosa pink and moist.  Abdomen:  Bowel sounds present, soft, non-tender, non-distended. No HSM or hernias noted. No rebound or guarding. No masses appreciated  Msk:  Symmetrical without gross deformities. Normal posture. Pulses:  Normal pulses noted. Extremities:  Without clubbing or edema. Neurologic:  Alert and  oriented x4;  grossly normal neurologically. Skin:  Warm and dry, intact without significant lesions.  Cervical Nodes:  No significant cervical adenopathy. Psych:  Alert and cooperative. Normal mood and affect.  Intake/Output from previous day: 07/02 0701 - 07/03 0700 In: 1340 [P.O.:960; I.V.:380] Out: 1300 [Urine:1300] Intake/Output this shift: No intake/output data recorded.  Lab Results: Recent Labs    06/02/21 2252 06/03/21 0554 06/04/21 0402  WBC 8.9 8.5 6.6  HGB 5.9* 8.7* 8.6*  HCT 19.7* 27.7* 27.7*  PLT 454* 408* 367   BMET Recent Labs    06/02/21 2252 06/03/21 0554  NA 131* 133*  K 3.6 3.9  CL 97* 97*  CO2 26 29  GLUCOSE 109* 87  BUN 11 9  CREATININE 0.50 0.42*  CALCIUM 7.7* 7.9*   LFT Recent Labs    06/02/21 2252 06/03/21 0554  PROT 5.4* 5.2*  ALBUMIN 2.6* 2.5*  AST 25 19  ALT 19 18  ALKPHOS 101 98  BILITOT 0.9 1.3*   PT/INR Recent Labs    06/02/21 2252 06/03/21 0554  LABPROT 13.2 13.2  INR 1.0 1.0   Hepatitis Panel No results for input(s):  HEPBSAG, HCVAB, HEPAIGM, HEPBIGM in the last 72 hours.   Studies/Results: DG Chest 1 View  Result Date: 06/03/2021 CLINICAL DATA:  75 year old female with shortness of breath. EXAM: CHEST  1 VIEW COMPARISON:  05/21/2021 and prior studies FINDINGS: UPPER limits normal heart size noted. Mild pulmonary vascular congestion may be present. Mild interstitial prominence is unchanged. There may be a trace LEFT pleural effusion noted. Mild bibasilar atelectasis/scarring again noted. IMPRESSION: Question mild pulmonary vascular congestion and trace LEFT pleural effusion. Electronically Signed   By: Margarette Canada M.D.   On: 06/03/2021 12:15   DG Abd 1 View  Result Date: 06/03/2021 CLINICAL DATA:  Status post EGD. EXAM: ABDOMEN - 1 VIEW COMPARISON:  None FINDINGS: The bowel gas pattern is normal. No radio-opaque calculi or other significant radiographic abnormality are seen. Status post bilateral ORIF of the femurs. Extensive heterotopic bone formation is noted overlying the left hip. IMPRESSION: Negative. Electronically Signed   By: Kerby Moors M.D.   On: 06/03/2021 12:13    Assessment: *Acute blood loss anemia *Duodenal ulcer *Upper GI bleeding  Plan: Patient underwent EGD 06/03/2021 which showed the duodenal ulcer previously identified is actually improved in size.  One pigmented spot was treated with epinephrine injection and coagulation with gold probe.  Hemoglobin stable this morning.  Patient feels great, tolerating soft diet without any issues.  We will continue on twice daily PPI as well as Carafate 4 times daily.  Avoid NSAIDs.  She will need CBC in 1 week.  Follow-up with GI in 6 to 8 weeks  GI to sign off, please call with any questions concerns.  Elon Alas. Abbey Chatters, D.O. Gastroenterology and Hepatology [facility]   LOS: 1 day    06/04/2021, 10:45 AM

## 2021-06-04 NOTE — Plan of Care (Signed)
  Problem: Acute Rehab PT Goals(only PT should resolve) Goal: Pt Will Ambulate Flowsheets (Taken 06/04/2021 1448) Pt will Ambulate:  > 125 feet  with modified independence  with rolling walker Goal: Pt Will Go Up/Down Stairs Flowsheets (Taken 06/04/2021 1448) Pt will Go Up / Down Stairs:  1-2 stairs  with modified independence Goal: Pt/caregiver will Perform Home Exercise Program Flowsheets (Taken 06/04/2021 1448) Pt/caregiver will Perform Home Exercise Program:  For increased strengthening  Independently   2:48 PM, 06/04/21 Josue Hector PT DPT  Physical Therapist with [facility]  [phone]

## 2021-06-04 NOTE — Discharge Summary (Addendum)
Physician Discharge Summary  Laura Foster:[id number] DOB: 11-03-1946 DOA: 06/02/2021  PCP: [name], MD  Admit date: 06/02/2021  Discharge date: 06/09/2021  Admitted From:SNF  Disposition:  Home with home health PT  Recommendations for Outpatient Follow-up:  Follow up with PCP in 1-2 weeks Recheck CBC in 1 week Follow-up with GI outpatient in 6-8 weeks Avoid NSAIDs Continue PPI twice daily Continue other home medications as prior  Home Health: yes PT  Equipment/Devices: None  Discharge Condition:Stable  CODE STATUS: Full  Diet recommendation: Heart Healthy  Brief/Interim Summary:  Laura CINQUEMANI is a 75 y.o. female with medical history significant for COPD, hypothyroidism, hyperlipidemia, dementia, GI bleed and tobacco abuse who presents to the emergency department from [facility] due to abnormal labs.  Routine labs drawn at the nursing center showed patient's hemoglobin at 5.9, this was 9.2 on 6/22, so she was sent to the ED for further evaluation.  Patient has undergone EGD on 7/2 with no active bleeding noted and duodenal ulcers actually appears smaller.  Given her profound anemia, however she has undergone gold probe coagulation and epinephrine injection.  She continues to maintain stable hemoglobin levels with no further bleeding noted.  She is tolerating diet and is stable for discharge with plans to continue PPI twice daily and follow-up with GI outpatient.  No other acute events noted this admission.  Discharge Diagnoses:  Principal Problem:   Acute blood loss anemia Active Problems:   Prolonged QT interval   COPD (chronic obstructive pulmonary disease) (HCC)   Depression   Hypothyroidism   Hyponatremia   Hypoalbuminemia   Macrocytic anemia   Thrombocytosis   Dementia (HCC)   Hyperlipidemia   Anxiety  Principal discharge diagnosis: Acute blood loss anemia suspected to be secondary to upper GI bleeding.  Discharge Instructions  Discharge  Instructions     Diet - low sodium heart healthy   Complete by: As directed    Increase activity slowly   Complete by: As directed    No wound care   Complete by: As directed       Allergies as of 06/08/2021       Reactions   Penicillins Rash        Medication List     TAKE these medications    acetaminophen 500 MG tablet Commonly known as: TYLENOL Take 2 tablets (1,000 mg total) by mouth every 8 (eight) hours as needed (pain, Headache and/or Fever >/= 101).   albuterol 108 (90 Base) MCG/ACT inhaler Commonly known as: VENTOLIN HFA Inhale 2 puffs into the lungs every 6 (six) hours as needed for wheezing or shortness of breath.   ALPRAZolam 0.5 MG tablet Commonly known as: XANAX Take 1 tablet (0.5 mg total) by mouth every morning.   atorvastatin 10 MG tablet Commonly known as: LIPITOR Take 10 mg by mouth daily.   citalopram 10 MG tablet Commonly known as: CELEXA Take 10 mg by mouth daily.   donepezil 5 MG tablet Commonly known as: ARICEPT Take 5 mg by mouth at bedtime.   feeding supplement Liqd Take 237 mLs by mouth 2 (two) times daily between meals.   ipratropium-albuterol 0.5-2.5 (3) MG/3ML Soln Commonly known as: DUONEB Take 3 mLs by nebulization every 4 (four) hours as needed.   levothyroxine 75 MCG tablet Commonly known as: SYNTHROID Take 1 tablet (75 mcg total) by mouth daily before breakfast.   melatonin 5 MG Tabs Take 5 mg by mouth at bedtime.   memantine 10  MG tablet Commonly known as: NAMENDA TAKE (1) TABLET BY MOUTH TWICE DAILY. What changed: See the new instructions.   multivitamin with minerals tablet Take 1 tablet by mouth daily.   pantoprazole 40 MG tablet Commonly known as: Protonix Take 1 tablet (40 mg total) by mouth 2 (two) times daily.   Spiriva Respimat 2.5 MCG/ACT Aers Generic drug: Tiotropium Bromide Monohydrate Inhale 2 puffs into the lungs daily.   sucralfate 1 GM/10ML suspension Commonly known as: CARAFATE Take 10  mLs (1 g total) by mouth 4 (four) times daily.   umeclidinium-vilanterol 62.5-25 MCG/INH Aepb Commonly known as: ANORO ELLIPTA Inhale 1 puff into the lungs at bedtime.   Vitamin D3 25 MCG (1000 UT) Caps Take 1,000 Units by mouth daily.         Contact information for follow-up providers     [name], MD. Schedule an appointment as soon as possible for a visit in 1 week(s).   Specialty: Internal Medicine Contact information: [place] [address] [phone]         [facility]. Schedule an appointment as soon as possible for a visit in 2 week(s).   Contact information: [address] [place] [phone]             Contact information for after-discharge care     [facility] .   Service: Skilled Nursing Contact information: [place] [address] [phone]                    Allergies  Allergen Reactions   Penicillins Rash    Consultations: GI   Procedures/Studies: DG Chest 1 View  Result Date: 06/03/2021 CLINICAL DATA:  75 year old female with shortness of breath. EXAM: CHEST  1 VIEW COMPARISON:  05/21/2021 and prior studies FINDINGS: UPPER limits normal heart size noted. Mild pulmonary vascular congestion may be present. Mild interstitial prominence is unchanged. There may be a trace LEFT pleural effusion noted. Mild bibasilar atelectasis/scarring again noted. IMPRESSION: Question mild pulmonary vascular congestion and trace LEFT pleural effusion. Electronically Signed   By: Margarette Canada M.D.   On: 06/03/2021 12:15   DG Chest 1 View  Result Date: 05/15/2021 CLINICAL DATA:  Right hip pain following fall, initial encounter EXAM: CHEST  1 VIEW COMPARISON:  06/20/2020 FINDINGS: Cardiac shadow is within normal limits. The lungs are well aerated bilaterally. No focal infiltrate or sizable effusion  is seen. Bony abnormality is noted IMPRESSION: No active disease. Electronically Signed   By: Inez Catalina M.D.   On: 05/15/2021 16:21   DG Pelvis 1-2 Views  Result Date: 05/16/2021 CLINICAL DATA:  Post IM nail RIGHT femur EXAM: PELVIS - 1-2 VIEW COMPARISON:  Portable exam 1356 hours compared to earlier intraoperative images of 05/16/2021 FINDINGS: IM nail with compression screw at RIGHT femur across a reduced intertrochanteric fracture. Old IM nail with locking screw at proximal LEFT femur. Bones demineralized. Hip and SI joint spaces preserved. No additional fracture or dislocation seen. Atherosclerotic calcifications aorta and iliac arteries. IMPRESSION: Post nailing of intertrochanteric fracture RIGHT femur. Aortic Atherosclerosis (ICD10-I70.0). Electronically Signed   By: Lavonia Dana M.D.   On: 05/16/2021 14:19   DG Pelvis 1-2 Views  Result Date: 05/15/2021 CLINICAL DATA:  Recent fall with pelvic pain on the right, initial encounter EXAM: PELVIS - 1 VIEW COMPARISON:  None. FINDINGS: Comminuted right intratrochanteric fracture is noted with mild  impaction at the fracture site. Medullary rod is noted within the proximal left femur. Pelvic ring appears intact. No soft tissue abnormality is noted. IMPRESSION: Right intratrochanteric femoral fracture. Electronically Signed   By: Inez Catalina M.D.   On: 05/15/2021 16:20   DG Abd 1 View  Result Date: 06/03/2021 CLINICAL DATA:  Status post EGD. EXAM: ABDOMEN - 1 VIEW COMPARISON:  None FINDINGS: The bowel gas pattern is normal. No radio-opaque calculi or other significant radiographic abnormality are seen. Status post bilateral ORIF of the femurs. Extensive heterotopic bone formation is noted overlying the left hip. IMPRESSION: Negative. Electronically Signed   By: Kerby Moors M.D.   On: 06/03/2021 12:13   DG Chest Portable 1 View  Result Date: 05/21/2021 CLINICAL DATA:  Shortness of breath. EXAM: PORTABLE CHEST 1 VIEW COMPARISON:  May 15, 2021  FINDINGS: The cardiac silhouette is normal. Mediastinal contours appear intact. Tortuosity and calcific atherosclerotic disease of the aorta. There is no evidence of focal airspace consolidation, pleural effusion or pneumothorax. Osseous structures are without acute abnormality. Soft tissues are grossly normal. IMPRESSION: 1. No active disease. 2. Tortuosity and calcific atherosclerotic disease of the aorta. Electronically Signed   By: Fidela Salisbury M.D.   On: 05/21/2021 11:35   DG HIP OPERATIVE UNILAT W OR W/O PELVIS RIGHT  Result Date: 05/16/2021 CLINICAL DATA:  RIGHT hip fracture, ORIF EXAM: OPERATIVE RIGHT HIP (WITH PELVIS IF PERFORMED) 12 VIEWS TECHNIQUE: Fluoroscopic spot image(s) were submitted for interpretation post-operatively. COMPARISON:  05/15/2021 FLUOROSCOPY TIME:  3 minutes 19 seconds FINDINGS: Images demonstrate placement of an IM nail with a compression screw across a intertrochanteric fracture of the RIGHT femur. Bones demineralized. No dislocation or additional fracture identified. IMPRESSION: Post ORIF of intertrochanteric fracture RIGHT femur Electronically Signed   By: Lavonia Dana M.D.   On: 05/16/2021 13:39   DG FEMUR, MIN 2 VIEWS RIGHT  Result Date: 05/16/2021 CLINICAL DATA:  Post RIGHT femoral nail placement EXAM: RIGHT FEMUR 2 VIEWS COMPARISON:  Portable exam 1357 hours compared intraoperative images of 05/16/2021 FINDINGS: IM nail with compression screw at proximal RIGHT femur across a reduced intertrochanteric fracture. Locking screw at mid femur. No additional fracture or dislocation identified. Joint space narrowing RIGHT knee. RIGHT hip joint space preserved. IMPRESSION: Post ORIF intertrochanteric fracture RIGHT femur. No acute complications. Electronically Signed   By: Lavonia Dana M.D.   On: 05/16/2021 14:20   DG FEMUR, MIN 2 VIEWS RIGHT  Result Date: 05/15/2021 CLINICAL DATA:  Recent with right hip pain, initial encounter EXAM: RIGHT FEMUR 2 VIEWS COMPARISON:   None. FINDINGS: Comminuted intratrochanteric fracture of the proximal right femur is noted. No dislocation is seen. Distal femur is within normal limits. No gross soft tissue abnormality is noted. IMPRESSION: Comminuted intratrochanteric right femoral fracture. Electronically Signed   By: Inez Catalina M.D.   On: 05/15/2021 16:20     Discharge Exam: Vitals:   06/08/21 0535 06/08/21 1353  BP: 113/62 (!) 109/57  Pulse: 66 75  Resp: 20 17  Temp: 98.4 F (36.9 C) 98.8 F (37.1 C)  SpO2: 98% (!) 82%   Vitals:   06/07/21 1948 06/07/21 2112 06/08/21 0535 06/08/21 1353  BP:  128/74 113/62 (!) 109/57  Pulse:  70 66 75  Resp:  19 20 17   Temp:  98.4 F (36.9 C) 98.4 F (36.9 C) 98.8 F (37.1 C)  TempSrc:  Oral Oral Oral  SpO2: 94% 99% 98% (!) 82%  Weight:      Height:  General: Pt is alert, awake, not in acute distress Cardiovascular: RRR, S1/S2 +, no rubs, no gallops Respiratory: CTA bilaterally, no wheezing, no rhonchi Abdominal: Soft, NT, ND, bowel sounds + Extremities: no edema, no cyanosis    The results of significant diagnostics from this hospitalization (including imaging, microbiology, ancillary and laboratory) are listed below for reference.     Microbiology: Recent Results (from the past 240 hour(s))  Resp Panel by RT-PCR (Flu A&B, Covid) Nasopharyngeal Swab     Status: None   Collection Time: 06/03/21 12:06 AM   Specimen: Nasopharyngeal Swab; Nasopharyngeal(NP) swabs in vial transport medium  Result Value Ref Range Status   SARS Coronavirus 2 by RT PCR NEGATIVE NEGATIVE Final    Comment: (NOTE) SARS-CoV-2 target nucleic acids are NOT DETECTED.  The SARS-CoV-2 RNA is generally detectable in upper respiratory specimens during the acute phase of infection. The lowest concentration of SARS-CoV-2 viral copies this assay can detect is 138 copies/mL. A negative result does not preclude SARS-Cov-2 infection and should not be used as the sole basis for treatment  or other patient management decisions. A negative result may occur with  improper specimen collection/handling, submission of specimen other than nasopharyngeal swab, presence of viral mutation(s) within the areas targeted by this assay, and inadequate number of viral copies(<138 copies/mL). A negative result must be combined with clinical observations, patient history, and epidemiological information. The expected result is Negative.  Fact Sheet for Patients:  EntrepreneurPulse.com.au  Fact Sheet for Healthcare Providers:  IncredibleEmployment.be  This test is no t yet approved or cleared by the [place] FDA and  has been authorized for detection and/or diagnosis of SARS-CoV-2 by FDA under an Emergency Use Authorization (EUA). This EUA will remain  in effect (meaning this test can be used) for the duration of the COVID-19 declaration under Section 564(b)(1) of the Act, 21 U.S.C.section 360bbb-3(b)(1), unless the authorization is terminated  or revoked sooner.       Influenza A by PCR NEGATIVE NEGATIVE Final   Influenza B by PCR NEGATIVE NEGATIVE Final    Comment: (NOTE) The Xpert Xpress SARS-CoV-2/FLU/RSV plus assay is intended as an aid in the diagnosis of influenza from Nasopharyngeal swab specimens and should not be used as a sole basis for treatment. Nasal washings and aspirates are unacceptable for Xpert Xpress SARS-CoV-2/FLU/RSV testing.  Fact Sheet for Patients: EntrepreneurPulse.com.au  Fact Sheet for Healthcare Providers: IncredibleEmployment.be  This test is not yet approved or cleared by the [place] FDA and has been authorized for detection and/or diagnosis of SARS-CoV-2 by FDA under an Emergency Use Authorization (EUA). This EUA will remain in effect (meaning this test can be used) for the duration of the COVID-19 declaration under Section 564(b)(1) of the Act, 21 U.S.C. section  360bbb-3(b)(1), unless the authorization is terminated or revoked.  Performed at [facility], [address], [place], [place] [address]      Labs: BNP (last 3 results) Recent Labs    05/21/21 0902  BNP 25.3   Basic Metabolic Panel: Recent Labs  Lab 06/02/21 2252 06/03/21 0554  NA 131* 133*  K 3.6 3.9  CL 97* 97*  CO2 26 29  GLUCOSE 109* 87  BUN 11 9  CREATININE 0.50 0.42*  CALCIUM 7.7* 7.9*  MG  --  2.0  PHOS  --  3.1   Liver Function Tests: Recent Labs  Lab 06/02/21 2252 06/03/21 0554  AST 25 19  ALT 19 18  ALKPHOS 101 98  BILITOT 0.9 1.3*  PROT 5.4* 5.2*  ALBUMIN 2.6* 2.5*   No results for input(s): LIPASE, AMYLASE in the last 168 hours. No results for input(s): AMMONIA in the last 168 hours. CBC: Recent Labs  Lab 06/02/21 2252 06/03/21 0554 06/04/21 0402 06/08/21 0601  WBC 8.9 8.5 6.6  --   HGB 5.9* 8.7* 8.6* 9.8*  HCT 19.7* 27.7* 27.7* 31.4*  MCV 103.1* 98.2 99.6  --   PLT 454* 408* 367  --    Cardiac Enzymes: No results for input(s): CKTOTAL, CKMB, CKMBINDEX, TROPONINI in the last 168 hours. BNP: Invalid input(s): POCBNP CBG: No results for input(s): GLUCAP in the last 168 hours. D-Dimer No results for input(s): DDIMER in the last 72 hours. Hgb A1c No results for input(s): HGBA1C in the last 72 hours. Lipid Profile No results for input(s): CHOL, HDL, LDLCALC, TRIG, CHOLHDL, LDLDIRECT in the last 72 hours. Thyroid function studies No results for input(s): TSH, T4TOTAL, T3FREE, THYROIDAB in the last 72 hours.  Invalid input(s): FREET3 Anemia work up No results for input(s): VITAMINB12, FOLATE, FERRITIN, TIBC, IRON, RETICCTPCT in the last 72 hours.  Urinalysis No results found for: COLORURINE, APPEARANCEUR, [place], [place], GLUCOSEU, [place], [place], [place], PROTEINUR, UROBILINOGEN, NITRITE, LEUKOCYTESUR Sepsis Labs Invalid input(s): PROCALCITONIN,  WBC,  LACTICIDVEN Microbiology Recent Results (from the past 240 hour(s))   Resp Panel by RT-PCR (Flu A&B, Covid) Nasopharyngeal Swab     Status: None   Collection Time: 06/03/21 12:06 AM   Specimen: Nasopharyngeal Swab; Nasopharyngeal(NP) swabs in vial transport medium  Result Value Ref Range Status   SARS Coronavirus 2 by RT PCR NEGATIVE NEGATIVE Final    Comment: (NOTE) SARS-CoV-2 target nucleic acids are NOT DETECTED.  The SARS-CoV-2 RNA is generally detectable in upper respiratory specimens during the acute phase of infection. The lowest concentration of SARS-CoV-2 viral copies this assay can detect is 138 copies/mL. A negative result does not preclude SARS-Cov-2 infection and should not be used as the sole basis for treatment or other patient management decisions. A negative result may occur with  improper specimen collection/handling, submission of specimen other than nasopharyngeal swab, presence of viral mutation(s) within the areas targeted by this assay, and inadequate number of viral copies(<138 copies/mL). A negative result must be combined with clinical observations, patient history, and epidemiological information. The expected result is Negative.  Fact Sheet for Patients:  EntrepreneurPulse.com.au  Fact Sheet for Healthcare Providers:  IncredibleEmployment.be  This test is no t yet approved or cleared by the [place] FDA and  has been authorized for detection and/or diagnosis of SARS-CoV-2 by FDA under an Emergency Use Authorization (EUA). This EUA will remain  in effect (meaning this test can be used) for the duration of the COVID-19 declaration under Section 564(b)(1) of the Act, 21 U.S.C.section 360bbb-3(b)(1), unless the authorization is terminated  or revoked sooner.       Influenza A by PCR NEGATIVE NEGATIVE Final   Influenza B by PCR NEGATIVE NEGATIVE Final    Comment: (NOTE) The Xpert Xpress SARS-CoV-2/FLU/RSV plus assay is intended as an aid in the diagnosis of influenza from  Nasopharyngeal swab specimens and should not be used as a sole basis for treatment. Nasal washings and aspirates are unacceptable for Xpert Xpress SARS-CoV-2/FLU/RSV testing.  Fact Sheet for Patients: EntrepreneurPulse.com.au  Fact Sheet for Healthcare Providers: IncredibleEmployment.be  This test is not yet approved or cleared by the [place] FDA and has been authorized for detection and/or diagnosis of SARS-CoV-2 by FDA under an Emergency Use Authorization (EUA). This  EUA will remain in effect (meaning this test can be used) for the duration of the COVID-19 declaration under Section 564(b)(1) of the Act, 21 U.S.C. section 360bbb-3(b)(1), unless the authorization is terminated or revoked.  Performed at [facility], [address] [place], [place] [address]      Time coordinating discharge: 35 minutes  SIGNED:   Barton Dubois MD [facility] 06/08/2021, 3:06 PM  If 7PM-7AM, please contact night-coverage [email or web address]

## 2021-06-05 MED ORDER — MELATONIN 3 MG PO TABS
3.0000 mg | ORAL_TABLET | Freq: Once | ORAL | Status: AC
  Administered 2021-06-05: 3 mg via ORAL
  Filled 2021-06-05: qty 1

## 2021-06-05 MED ORDER — MELATONIN 3 MG PO TABS
6.0000 mg | ORAL_TABLET | Freq: Once | ORAL | Status: AC
  Administered 2021-06-05: 6 mg via ORAL
  Filled 2021-06-05: qty 2

## 2021-06-05 NOTE — Progress Notes (Signed)
Patient seen and evaluated this a.m. with no acute overnight events or concerns noted.  She has been seen by GI with plans for outpatient repeat EGD in 6-8 weeks.  PT has evaluated patient with recommendations for return to rehab/SNF.  She is in stable condition for discharge.  Please refer to discharge summary dictated 7/3 for full details.  Total care time: 15 minutes.

## 2021-06-06 ENCOUNTER — Encounter (HOSPITAL_COMMUNITY): Payer: Self-pay | Admitting: Internal Medicine

## 2021-06-06 MED ORDER — PANTOPRAZOLE SODIUM 40 MG PO TBEC
40.0000 mg | DELAYED_RELEASE_TABLET | Freq: Two times a day (BID) | ORAL | Status: DC
  Administered 2021-06-06 – 2021-06-09 (×6): 40 mg via ORAL
  Filled 2021-06-06 (×6): qty 1

## 2021-06-06 NOTE — Care Management Important Message (Signed)
Important Message  Patient Details  Name: Laura Foster MRN: [id number] Date of Birth: 07-27-1946   Medicare Important Message Given:  Yes     Tommy Medal 06/06/2021, 12:04 PM

## 2021-06-06 NOTE — TOC Progression Note (Signed)
Transition of Care [facility]) - Progression Note    Patient Details  Name: Laura Foster MRN: [id number] Date of Birth: 1946/07/21  Transition of Care [facility]) CM/SW Contact  Shade Flood, LCSW Phone Number: 06/06/2021, 2:15 PM  Clinical Narrative:     Pt is medically stable for dc. Insurance requested peer to peer review which was done by Dr. Manuella Ghazi and pt was denied rehab auth. Spoke with pt's sister and daughter to update. Pt's daughter initiating an appeal of the decision with the insurance. Pt will remain in the hospital until appeal decision is made. TOC will follow.    Barriers to Discharge: Insurance Authorization  Expected Discharge Plan and Services           Expected Discharge Date: 06/04/21                                     Social Determinants of Health (SDOH) Interventions    Readmission Risk Interventions No flowsheet data found.

## 2021-06-06 NOTE — Progress Notes (Signed)
Patient seen and evaluated today with no acute concerns or complaints noted.  Peer-to-peer completed with recommendations to go home with home health PT as she has improved on recent physical therapy examination.  No new concerns or events noted overnight.  She is stable for discharge today.  Total care time: 30 minutes.

## 2021-06-06 NOTE — Progress Notes (Addendum)
Patient has had 2 readmissions to the hospital since her initial discharge to skilled nursing facility. Initial discharge was on  05/18/2021 with readmission on 05/21/2021.  She was then discharged back to SNF on 05/24/2021 with readmission on 06/03/2021.  Unfortunately, with recurrent need for hospital admissions she has had a significant amount of interruption to her physical therapy process and she has not yet returned to a level of functioning consistent with a safe discharge to home.  She still has significant difficulty with ambulation and remains a high fall risk.

## 2021-06-07 MED ORDER — ALPRAZOLAM 0.5 MG PO TABS
0.5000 mg | ORAL_TABLET | Freq: Every evening | ORAL | Status: AC | PRN
  Administered 2021-06-07: 0.5 mg via ORAL
  Filled 2021-06-07: qty 1

## 2021-06-07 NOTE — Progress Notes (Signed)
[facility] night shift.  The staff reported that the patient had requested something to help her sleep.  She has had QT prolongation on ECG recently.  Alprazolam 0.5 mg p.o. nightly as needed x1 dose ordered.  Tennis Must, MD.

## 2021-06-07 NOTE — Progress Notes (Addendum)
Physical Therapy Treatment Patient Details Name: Laura Foster MRN: [id number] DOB: June 25, 1946 Today's Date: 06/07/2021    History of Present Illness Laura Foster is a 75 y.o. female with medical history significant for COPD, hypothyroidism, hyperlipidemia, dementia, GI bleed and tobacco abuse who presents to the emergency department from [facility] center due to abnormal labs.  Routine labs drawn at the nursing center showed patient's hemoglobin at 5.9, this was 9.2 on 6/22, so she was sent to the ED for further evaluation.  Patient denies chest pain, nausea, vomiting, shortness of breath, rectal bleeding or noticing any black stools.  He was admitted on 05/21/2021 due to acute GI bleed during which she had EGD done on 05/22/2021 which showed gastritis, gastric and duodenal ulcer presumed to be NSAID induced.    PT Comments    Patient presents in bed and agreeable to therapy. Patient was placed on room air, with SpO2 at 98% at rest. Patient demonstrated modified independence for bed mobility and transfer to sitting at EOB with slightly labored movement of her RLE. Patient completed LE therapeutic exercise and pre gait activity to with a noted improvement in strength and balance. Patient was able to transfer from sit to stand with modified independence using a RW.  Patient was able to ambulate 30 feet with min guard using a RW, during ambulation patient SpO2 dropped to 84% with exertion. Patient returned to chair and was placed on 2 Lpm of O2 VC and SpoO2 returned to 96%. Patient will benefit from continued physical therapy in hospital and recommended venue below to increase strength, balance, endurance for safe ADLs and gait.   Follow Up Recommendations  SNF;Supervision/Assistance - 24 hour;Supervision for mobility/OOB     Equipment Recommendations  None recommended by PT    Recommendations for Other Services       Precautions / Restrictions Precautions Precautions:  Fall Restrictions Weight Bearing Restrictions: Yes RLE Weight Bearing: Weight bearing as tolerated    Mobility  Bed Mobility Overal bed mobility: Modified Independent Bed Mobility: Supine to Sit     Supine to sit: Modified independent (Device/Increase time)     General bed mobility comments: Slight labored movement, but able to do independent Patient Response: Cooperative  Transfers Overall transfer level: Modified independent Equipment used: Rolling walker (2 wheeled) Transfers: Sit to/from Stand Sit to Stand: Modified independent (Device/Increase time) Stand pivot transfers: Min assist;Min guard       General transfer comment: Slow moving, but shows good awareness and steadiness, able to do independent with increased time  Ambulation/Gait Ambulation/Gait assistance: Modified independent (Device/Increase time);Supervision Gait Distance (Feet): 30 Feet Assistive device: Rolling walker (2 wheeled) Gait Pattern/deviations: Decreased stride length;Decreased step length - left;Trunk flexed;Decreased weight shift to right;Narrow base of support Gait velocity: decreased   General Gait Details: Slow, but steady, favors RLE, required steanding breaks with cues for breathing   Stairs             Wheelchair Mobility    Modified Rankin (Stroke Patients Only)       Balance Overall balance assessment: Modified Independent Sitting-balance support: Feet supported;No upper extremity supported Sitting balance-Leahy Scale: Good Sitting balance - Comments: good/fair at EOB   Standing balance support: During functional activity;Bilateral upper extremity supported Standing balance-Leahy Scale: Fair Standing balance comment: using RW                            Cognition Arousal/Alertness: Awake/alert Behavior During Therapy: [facility]  for tasks assessed/performed Overall Cognitive Status: Within Functional Limits for tasks assessed                                         Exercises General Exercises - Lower Extremity Long Arc Quad: Seated;AROM;Strengthening;Both;15 reps Hip Flexion/Marching: Seated;AROM;Strengthening;Both;Standing;15 reps Toe Raises: Seated;AROM;Strengthening;Both;15 reps Heel Raises: Seated;AROM;Strengthening;Both;15 reps    General Comments        Pertinent Vitals/Pain Pain Assessment: Faces Faces Pain Scale: Hurts a little bit Pain Location: R hip with ambulation Pain Descriptors / Indicators: Sore;Grimacing;Guarding Pain Intervention(s): Limited activity within patient's tolerance;Monitored during session;Repositioned    Home Living                      Prior Function            PT Goals (current goals can now be found in the care plan section) Acute Rehab PT Goals Patient Stated Goal: return home after rehab PT Goal Formulation: With patient Time For Goal Achievement: 06/18/21 Potential to Achieve Goals: Good Progress towards PT goals: Progressing toward goals    Frequency    Min 3X/week      PT Plan Current plan remains appropriate    Co-evaluation              AM-PAC PT "6 Clicks" Mobility   Outcome Measure  Help needed turning from your back to your side while in a flat bed without using bedrails?: A Little Help needed moving from lying on your back to sitting on the side of a flat bed without using bedrails?: A Little Help needed moving to and from a bed to a chair (including a wheelchair)?: A Little Help needed standing up from a chair using your arms (e.g., wheelchair or bedside chair)?: A Little Help needed to walk in hospital room?: A Little Help needed climbing 3-5 steps with a railing? : A Lot 6 Click Score: 17    End of Session   Activity Tolerance: Patient tolerated treatment well;Patient limited by fatigue Patient left: in chair;with call bell/phone within reach Nurse Communication: Mobility status PT Visit Diagnosis: Other abnormalities of gait and  mobility (R26.89);Muscle weakness (generalized) (M62.81);Unsteadiness on feet (R26.81)     Time: 3474-2595 PT Time Calculation (min) (ACUTE ONLY): 20 min  Charges:  $Gait Training: 8-22 mins $Therapeutic Exercise: 8-22 mins                    10:55 AM, 06/07/21 Sinclair Ship SPT  10:55 AM, 06/07/21 Lonell Grandchild, MPT Physical Therapist with [facility] [phone] office [phone] mobile phone

## 2021-06-07 NOTE — TOC Progression Note (Addendum)
Transition of Care [facility]) - Progression Note    Patient Details  Name: Laura Foster MRN: [id number] Date of Birth: June 14, 1946  Transition of Care [facility]) CM/SW Contact  Shade Flood, LCSW Phone Number: 06/07/2021, 3:29 PM  Clinical Narrative:     TOC following. Continuing to await insurance decision on appeal of SNF denial. Have also referred pt out to two ALFs near [place] (at daughter's request).  ALF referrals sent to 1) [facility]. Contact is [employer] (Phone [phone]  Fax [phone]) and 2) [facility]. Contact numbers are phone [phone] and Fax [phone].  Will follow up in AM.    Barriers to Discharge: Insurance Authorization  Expected Discharge Plan and Services           Expected Discharge Date: 06/04/21                                     Social Determinants of Health (SDOH) Interventions    Readmission Risk Interventions No flowsheet data found.

## 2021-06-07 NOTE — Progress Notes (Signed)
Patient seen and evaluated this a.m. with no complaints or concerns noted.  She is in stable condition for discharge, however she remains in the hospital as family is appealing the decision from her insurance company and is desiring inpatient rehabilitation.  Please refer to discharge summary dictated 7/3 for full details.  Plan to recheck H/H in AM.  Total care time: 20 minutes.

## 2021-06-08 LAB — HEMOGLOBIN AND HEMATOCRIT, BLOOD
HCT: 31.4 % — ABNORMAL LOW (ref 36.0–46.0)
Hemoglobin: 9.8 g/dL — ABNORMAL LOW (ref 12.0–15.0)

## 2021-06-08 MED ORDER — UMECLIDINIUM-VILANTEROL 62.5-25 MCG/INH IN AEPB: 1.0000 | INHALATION_SPRAY | Freq: Every day | RESPIRATORY_TRACT | Status: DC

## 2021-06-08 NOTE — TOC Progression Note (Signed)
Transition of Care [facility]) - Progression Note    Patient Details  Name: Laura Foster MRN: [id number] Date of Birth: 1946/02/23  Transition of Care [facility]) CM/SW Contact  Salome Arnt, [place] Phone Number: 06/08/2021, 3:29 PM  Clinical Narrative:  LCSW spoke with Marlou Starks with [facility] who reports pt's daughter said she received phone call from [facility] stating that appeal was approved for SNF. However, this has not been updated in [facility] system for SNF to accept pt today. TOC will follow up in AM.        Barriers to Discharge: Insurance Authorization  Expected Discharge Plan and Services           Expected Discharge Date: 06/04/21                                     Social Determinants of Health (SDOH) Interventions    Readmission Risk Interventions No flowsheet data found.

## 2021-06-08 NOTE — Progress Notes (Signed)
Patient seen and evaluated this a.m. with repeat hemoglobin levels noted to be 9.8 and stable.  She denies any significant complaints or concerns today.  She is in stable condition for discharge today.  Please refer to discharge summary dictated 7/3 for full details.  Total care time: 20 minutes.

## 2021-06-09 DIAGNOSIS — J9601 Acute respiratory failure with hypoxia: Secondary | ICD-10-CM | POA: Diagnosis not present

## 2021-06-09 DIAGNOSIS — Z79899 Other long term (current) drug therapy: Secondary | ICD-10-CM | POA: Diagnosis not present

## 2021-06-09 DIAGNOSIS — K259 Gastric ulcer, unspecified as acute or chronic, without hemorrhage or perforation: Secondary | ICD-10-CM | POA: Diagnosis not present

## 2021-06-09 DIAGNOSIS — M6281 Muscle weakness (generalized): Secondary | ICD-10-CM | POA: Diagnosis not present

## 2021-06-09 DIAGNOSIS — Z720 Tobacco use: Secondary | ICD-10-CM | POA: Diagnosis not present

## 2021-06-09 DIAGNOSIS — R2689 Other abnormalities of gait and mobility: Secondary | ICD-10-CM | POA: Diagnosis not present

## 2021-06-09 DIAGNOSIS — E039 Hypothyroidism, unspecified: Secondary | ICD-10-CM | POA: Diagnosis not present

## 2021-06-09 DIAGNOSIS — R279 Unspecified lack of coordination: Secondary | ICD-10-CM | POA: Diagnosis not present

## 2021-06-09 DIAGNOSIS — Z7401 Bed confinement status: Secondary | ICD-10-CM | POA: Diagnosis not present

## 2021-06-09 DIAGNOSIS — R412 Retrograde amnesia: Secondary | ICD-10-CM | POA: Diagnosis not present

## 2021-06-09 DIAGNOSIS — K269 Duodenal ulcer, unspecified as acute or chronic, without hemorrhage or perforation: Secondary | ICD-10-CM | POA: Diagnosis not present

## 2021-06-09 DIAGNOSIS — I4581 Long QT syndrome: Secondary | ICD-10-CM | POA: Diagnosis not present

## 2021-06-09 DIAGNOSIS — K922 Gastrointestinal hemorrhage, unspecified: Secondary | ICD-10-CM | POA: Diagnosis not present

## 2021-06-09 DIAGNOSIS — J441 Chronic obstructive pulmonary disease with (acute) exacerbation: Secondary | ICD-10-CM | POA: Diagnosis not present

## 2021-06-09 DIAGNOSIS — S72141D Displaced intertrochanteric fracture of right femur, subsequent encounter for closed fracture with routine healing: Secondary | ICD-10-CM | POA: Diagnosis not present

## 2021-06-09 DIAGNOSIS — D649 Anemia, unspecified: Secondary | ICD-10-CM | POA: Diagnosis not present

## 2021-06-09 DIAGNOSIS — F32A Depression, unspecified: Secondary | ICD-10-CM | POA: Diagnosis not present

## 2021-06-09 MED ORDER — ACETAMINOPHEN 325 MG PO TABS
650.0000 mg | ORAL_TABLET | Freq: Four times a day (QID) | ORAL | Status: DC | PRN
  Administered 2021-06-09: 650 mg via ORAL

## 2021-06-09 NOTE — TOC Transition Note (Signed)
Transition of Care [facility]) - CM/SW Discharge Note   Patient Details  Name: Laura Foster MRN: [id number] Date of Birth: Aug 30, 1946  Transition of Care [facility]) CM/SW Contact:  Natasha Bence, LCSW Phone Number: 06/09/2021, 1:25 PM   Clinical Narrative:    CSW notified of patient's readiness for discharge. Debbie with [facility] agreeable to take patient. CSW contacted EMS and completed med necessity. Nurse to call report. TOC signing off.   Final next level of care: Skilled Nursing Facility Barriers to Discharge: Barriers Resolved   Patient Goals and CMS Choice Patient states their goals for this hospitalization and ongoing recovery are:: Rehab with SNF CMS Medicare.gov Compare Post Acute Care list provided to:: Patient    Discharge Placement                Patient to be transferred to facility by: [facility] Name of family member notified: Renita Papa Patient and family notified of of transfer: 06/09/21  Discharge Plan and Services                                     Social Determinants of Health (SDOH) Interventions     Readmission Risk Interventions No flowsheet data found.

## 2021-06-09 NOTE — Care Management Important Message (Signed)
Important Message  Patient Details  Name: Laura Foster MRN: [id number] Date of Birth: May 06, 1946   Medicare Important Message Given:  Yes     Tommy Medal 06/09/2021, 12:06 PM

## 2021-06-09 NOTE — Progress Notes (Signed)
Patient seen and examined.  Hemodynamically stable and in no acute distress.  Family members updated at bedside.  Patient denies nausea, vomiting, abdominal pain or any acute complaints.  Discharge summary from 7" 322 fully reviewed and appropriate.  No changes has been made.  Patient ready for discharge to skilled nursing facility for further care and rehabilitation. Case discussed with TOC.  Barton Dubois MD [phone]

## 2021-06-12 DIAGNOSIS — S72141D Displaced intertrochanteric fracture of right femur, subsequent encounter for closed fracture with routine healing: Secondary | ICD-10-CM | POA: Diagnosis not present

## 2021-06-12 DIAGNOSIS — E039 Hypothyroidism, unspecified: Secondary | ICD-10-CM | POA: Diagnosis not present

## 2021-06-12 DIAGNOSIS — Z79899 Other long term (current) drug therapy: Secondary | ICD-10-CM | POA: Diagnosis not present

## 2021-06-12 DIAGNOSIS — D649 Anemia, unspecified: Secondary | ICD-10-CM | POA: Diagnosis not present

## 2021-06-13 DIAGNOSIS — S72141D Displaced intertrochanteric fracture of right femur, subsequent encounter for closed fracture with routine healing: Secondary | ICD-10-CM | POA: Diagnosis not present

## 2021-06-14 ENCOUNTER — Encounter: Payer: Medicare Other | Admitting: Orthopedic Surgery

## 2021-06-15 DIAGNOSIS — J441 Chronic obstructive pulmonary disease with (acute) exacerbation: Secondary | ICD-10-CM | POA: Diagnosis not present

## 2021-07-06 ENCOUNTER — Telehealth: Payer: Self-pay | Admitting: *Deleted

## 2021-07-06 ENCOUNTER — Encounter: Payer: Self-pay | Admitting: *Deleted

## 2021-07-06 ENCOUNTER — Ambulatory Visit (INDEPENDENT_AMBULATORY_CARE_PROVIDER_SITE_OTHER): Payer: Medicare Other | Admitting: Internal Medicine

## 2021-07-06 ENCOUNTER — Other Ambulatory Visit: Payer: Self-pay

## 2021-07-06 ENCOUNTER — Encounter: Payer: Self-pay | Admitting: Internal Medicine

## 2021-07-06 VITALS — BP 125/66 | HR 95 | Temp 97.5°F | Ht 65.0 in | Wt 152.6 lb

## 2021-07-06 DIAGNOSIS — K2960 Other gastritis without bleeding: Secondary | ICD-10-CM | POA: Diagnosis not present

## 2021-07-06 DIAGNOSIS — D500 Iron deficiency anemia secondary to blood loss (chronic): Secondary | ICD-10-CM

## 2021-07-06 DIAGNOSIS — T39395A Adverse effect of other nonsteroidal anti-inflammatory drugs [NSAID], initial encounter: Secondary | ICD-10-CM

## 2021-07-06 DIAGNOSIS — K269 Duodenal ulcer, unspecified as acute or chronic, without hemorrhage or perforation: Secondary | ICD-10-CM | POA: Diagnosis not present
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
## 2021-07-06 NOTE — Patient Instructions (Signed)
I am happy to hear you are doing better.  We will check blood counts today to ensure they are stable.  We will plan on repeat EGD in 1 to 2 months.  Continue on pantoprazole twice daily.  Continue to avoid NSAIDs.  At [facility] we value your feedback. You may receive a survey about your visit today. Please share your experience as we strive to create trusting relationships with our patients to provide genuine, compassionate, quality care.  We appreciate your understanding and patience as we review any laboratory studies, imaging, and other diagnostic tests that are ordered as we care for you. Our office policy is 5 business days for review of these results, and any emergent or urgent results are addressed in a timely manner for your best interest. If you do not hear from our office in 1 week, please contact us.   We also encourage the use of MyChart, which contains your medical information for your review as well. If you are not enrolled in this feature, an access code is on this after visit summary for your convenience. Thank you for allowing [place] to be involved in your care.  It was great to see you today!  I hope you have a great rest of your summer!!    Elon Alas. Abbey Chatters, D.O. Gastroenterology and Hepatology [facility]

## 2021-07-06 NOTE — Telephone Encounter (Signed)
[employer] and spoke with Debbie. Patient scheduled for EGD with propofol Dr Abbey Chatters, ASA 4 on 9/19 at 10:30am. Aware will fax instructions with pre-op appt to her at [phone]   PA for EGD done and approved via [facility]. Auth# [id number], DOS Aug 21, 2021 - Nov 19, 2021

## 2021-07-06 NOTE — Addendum Note (Signed)
Addended by: [name] on: 07/06/2021 09:21 AM   Modules accepted: Orders

## 2021-07-10 DIAGNOSIS — D649 Anemia, unspecified: Secondary | ICD-10-CM | POA: Diagnosis not present

## 2021-07-10 DIAGNOSIS — F32A Depression, unspecified: Secondary | ICD-10-CM | POA: Diagnosis not present

## 2021-07-10 DIAGNOSIS — E039 Hypothyroidism, unspecified: Secondary | ICD-10-CM | POA: Diagnosis not present

## 2021-07-10 DIAGNOSIS — J441 Chronic obstructive pulmonary disease with (acute) exacerbation: Secondary | ICD-10-CM | POA: Diagnosis not present

## 2021-07-10 DIAGNOSIS — S72141D Displaced intertrochanteric fracture of right femur, subsequent encounter for closed fracture with routine healing: Secondary | ICD-10-CM | POA: Diagnosis not present

## 2021-07-12 DIAGNOSIS — J441 Chronic obstructive pulmonary disease with (acute) exacerbation: Secondary | ICD-10-CM | POA: Diagnosis not present

## 2021-07-12 DIAGNOSIS — U071 COVID-19: Secondary | ICD-10-CM | POA: Diagnosis not present

## 2021-07-12 DIAGNOSIS — Z1159 Encounter for screening for other viral diseases: Secondary | ICD-10-CM | POA: Diagnosis not present

## 2021-07-13 DIAGNOSIS — J441 Chronic obstructive pulmonary disease with (acute) exacerbation: Secondary | ICD-10-CM | POA: Diagnosis not present

## 2021-07-13 DIAGNOSIS — E039 Hypothyroidism, unspecified: Secondary | ICD-10-CM | POA: Diagnosis not present

## 2021-07-13 DIAGNOSIS — S72141D Displaced intertrochanteric fracture of right femur, subsequent encounter for closed fracture with routine healing: Secondary | ICD-10-CM | POA: Diagnosis not present

## 2021-07-13 DIAGNOSIS — F32A Depression, unspecified: Secondary | ICD-10-CM | POA: Diagnosis not present

## 2021-07-13 DIAGNOSIS — R7989 Other specified abnormal findings of blood chemistry: Secondary | ICD-10-CM | POA: Diagnosis not present

## 2021-07-13 DIAGNOSIS — U071 COVID-19: Secondary | ICD-10-CM | POA: Diagnosis not present

## 2021-07-13 DIAGNOSIS — Z79899 Other long term (current) drug therapy: Secondary | ICD-10-CM | POA: Diagnosis not present

## 2021-07-17 DIAGNOSIS — D649 Anemia, unspecified: Secondary | ICD-10-CM | POA: Diagnosis not present

## 2021-07-17 DIAGNOSIS — F32A Depression, unspecified: Secondary | ICD-10-CM | POA: Diagnosis not present

## 2021-07-17 DIAGNOSIS — J441 Chronic obstructive pulmonary disease with (acute) exacerbation: Secondary | ICD-10-CM | POA: Diagnosis not present

## 2021-07-17 DIAGNOSIS — E039 Hypothyroidism, unspecified: Secondary | ICD-10-CM | POA: Diagnosis not present

## 2021-07-17 DIAGNOSIS — Z79899 Other long term (current) drug therapy: Secondary | ICD-10-CM | POA: Diagnosis not present

## 2021-07-31 DIAGNOSIS — D649 Anemia, unspecified: Secondary | ICD-10-CM | POA: Diagnosis not present

## 2021-07-31 DIAGNOSIS — E039 Hypothyroidism, unspecified: Secondary | ICD-10-CM | POA: Diagnosis not present

## 2021-07-31 DIAGNOSIS — Z79899 Other long term (current) drug therapy: Secondary | ICD-10-CM | POA: Diagnosis not present

## 2021-07-31 DIAGNOSIS — J441 Chronic obstructive pulmonary disease with (acute) exacerbation: Secondary | ICD-10-CM | POA: Diagnosis not present

## 2021-07-31 DIAGNOSIS — F32A Depression, unspecified: Secondary | ICD-10-CM | POA: Diagnosis not present

## 2021-07-31 DIAGNOSIS — S72141D Displaced intertrochanteric fracture of right femur, subsequent encounter for closed fracture with routine healing: Secondary | ICD-10-CM | POA: Diagnosis not present

## 2021-07-31 DIAGNOSIS — J449 Chronic obstructive pulmonary disease, unspecified: Secondary | ICD-10-CM | POA: Diagnosis not present

## 2021-08-02 DIAGNOSIS — E785 Hyperlipidemia, unspecified: Secondary | ICD-10-CM | POA: Diagnosis not present

## 2021-08-02 DIAGNOSIS — J441 Chronic obstructive pulmonary disease with (acute) exacerbation: Secondary | ICD-10-CM | POA: Diagnosis not present

## 2021-08-02 DIAGNOSIS — E039 Hypothyroidism, unspecified: Secondary | ICD-10-CM | POA: Diagnosis not present

## 2021-08-02 DIAGNOSIS — K259 Gastric ulcer, unspecified as acute or chronic, without hemorrhage or perforation: Secondary | ICD-10-CM | POA: Diagnosis not present

## 2021-08-07 DIAGNOSIS — S72141D Displaced intertrochanteric fracture of right femur, subsequent encounter for closed fracture with routine healing: Secondary | ICD-10-CM | POA: Diagnosis not present

## 2021-08-07 DIAGNOSIS — E785 Hyperlipidemia, unspecified: Secondary | ICD-10-CM | POA: Diagnosis not present

## 2021-08-07 DIAGNOSIS — D649 Anemia, unspecified: Secondary | ICD-10-CM | POA: Diagnosis not present

## 2021-08-07 DIAGNOSIS — J441 Chronic obstructive pulmonary disease with (acute) exacerbation: Secondary | ICD-10-CM | POA: Diagnosis not present

## 2021-08-07 DIAGNOSIS — Z7951 Long term (current) use of inhaled steroids: Secondary | ICD-10-CM | POA: Diagnosis not present

## 2021-08-07 DIAGNOSIS — Z9981 Dependence on supplemental oxygen: Secondary | ICD-10-CM | POA: Diagnosis not present

## 2021-08-07 DIAGNOSIS — E039 Hypothyroidism, unspecified: Secondary | ICD-10-CM | POA: Diagnosis not present

## 2021-08-07 DIAGNOSIS — J449 Chronic obstructive pulmonary disease, unspecified: Secondary | ICD-10-CM | POA: Diagnosis not present

## 2021-08-07 DIAGNOSIS — F32A Depression, unspecified: Secondary | ICD-10-CM | POA: Diagnosis not present

## 2021-08-11 DIAGNOSIS — D649 Anemia, unspecified: Secondary | ICD-10-CM | POA: Diagnosis not present

## 2021-08-11 DIAGNOSIS — Z9981 Dependence on supplemental oxygen: Secondary | ICD-10-CM | POA: Diagnosis not present

## 2021-08-11 DIAGNOSIS — J441 Chronic obstructive pulmonary disease with (acute) exacerbation: Secondary | ICD-10-CM | POA: Diagnosis not present

## 2021-08-11 DIAGNOSIS — S72141D Displaced intertrochanteric fracture of right femur, subsequent encounter for closed fracture with routine healing: Secondary | ICD-10-CM | POA: Diagnosis not present

## 2021-08-11 DIAGNOSIS — E039 Hypothyroidism, unspecified: Secondary | ICD-10-CM | POA: Diagnosis not present

## 2021-08-11 DIAGNOSIS — F32A Depression, unspecified: Secondary | ICD-10-CM | POA: Diagnosis not present

## 2021-08-11 DIAGNOSIS — E785 Hyperlipidemia, unspecified: Secondary | ICD-10-CM | POA: Diagnosis not present

## 2021-08-11 DIAGNOSIS — J449 Chronic obstructive pulmonary disease, unspecified: Secondary | ICD-10-CM | POA: Diagnosis not present

## 2021-08-11 DIAGNOSIS — Z7951 Long term (current) use of inhaled steroids: Secondary | ICD-10-CM | POA: Diagnosis not present

## 2021-08-14 DIAGNOSIS — E039 Hypothyroidism, unspecified: Secondary | ICD-10-CM | POA: Diagnosis not present

## 2021-08-14 DIAGNOSIS — J441 Chronic obstructive pulmonary disease with (acute) exacerbation: Secondary | ICD-10-CM | POA: Diagnosis not present

## 2021-08-14 DIAGNOSIS — J449 Chronic obstructive pulmonary disease, unspecified: Secondary | ICD-10-CM | POA: Diagnosis not present

## 2021-08-14 DIAGNOSIS — Z7951 Long term (current) use of inhaled steroids: Secondary | ICD-10-CM | POA: Diagnosis not present

## 2021-08-14 DIAGNOSIS — S72141D Displaced intertrochanteric fracture of right femur, subsequent encounter for closed fracture with routine healing: Secondary | ICD-10-CM | POA: Diagnosis not present

## 2021-08-14 DIAGNOSIS — F32A Depression, unspecified: Secondary | ICD-10-CM | POA: Diagnosis not present

## 2021-08-14 DIAGNOSIS — D649 Anemia, unspecified: Secondary | ICD-10-CM | POA: Diagnosis not present

## 2021-08-14 DIAGNOSIS — Z9981 Dependence on supplemental oxygen: Secondary | ICD-10-CM | POA: Diagnosis not present

## 2021-08-14 DIAGNOSIS — E785 Hyperlipidemia, unspecified: Secondary | ICD-10-CM | POA: Diagnosis not present

## 2021-08-15 NOTE — Patient Instructions (Signed)
Laura Foster  08/15/2021     '@PREFPERIOPPHARMACY'$ @   Your procedure is scheduled on  08/21/2021.   Report to [facility] at  815 A.M.   Call this number if you have problems the morning of surgery:  [phone]   Remember:  Follow the diet instructions given to you by the office.     Use your nebulizer and your inhaler before you come and bring your rescue inhaler with you.    Take these medicines the morning of surgery with A SIP OF WATER      xanax (if needed), celexa, levothyroxine, namenda, protonix.     Do not wear jewelry, make-up or nail polish.  Do not wear lotions, powders, or perfumes, or deodorant.  Do not shave 48 hours prior to surgery.  Men may shave face and neck.  Do not bring valuables to the hospital.  [facility] is not responsible for any belongings or valuables.  Contacts, dentures or bridgework may not be worn into surgery.  Leave your suitcase in the car.  After surgery it may be brought to your room.  For patients admitted to the hospital, discharge time will be determined by your treatment team.  Patients discharged the day of surgery will not be allowed to drive home and must have someone with them for 24 hours.    Special instructions:   DO NOT smoke tobacco or vape for 24 hours before your procedure.  Please read over the following fact sheets that you were given. Anesthesia Post-op Instructions and Care and Recovery After Surgery      Upper Endoscopy, Adult, Care After This sheet gives you information about how to care for yourself after your procedure. Your health care provider may also give you more specific instructions. If you have problems or questions, contact your health care provider. What can I expect after the procedure? After the procedure, it is common to have: A sore throat. Mild stomach pain or discomfort. Bloating. Nausea. Follow these instructions at home:  Follow instructions from your health care provider  about what to eat or drink after your procedure. Return to your normal activities as told by your health care provider. Ask your health care provider what activities are safe for you. Take over-the-counter and prescription medicines only as told by your health care provider. If you were given a sedative during the procedure, it can affect you for several hours. Do not drive or operate machinery until your health care provider says that it is safe. Keep all follow-up visits as told by your health care provider. This is important. Contact a health care provider if you have: A sore throat that lasts longer than one day. Trouble swallowing. Get help right away if: You vomit blood or your vomit looks like coffee grounds. You have: A fever. Bloody, black, or tarry stools. A severe sore throat or you cannot swallow. Difficulty breathing. Severe pain in your chest or abdomen. Summary After the procedure, it is common to have a sore throat, mild stomach discomfort, bloating, and nausea. If you were given a sedative during the procedure, it can affect you for several hours. Do not drive or operate machinery until your health care provider says that it is safe. Follow instructions from your health care provider about what to eat or drink after your procedure. Return to your normal activities as told by your health care provider. This information is not intended to replace advice given to you  by your health care provider. Make sure you discuss any questions you have with your health care provider. Document Revised: 11/17/2019 Document Reviewed: 04/21/2018 Elsevier Patient Education  2022 [place] After This sheet gives you information about how to care for yourself after your procedure. Your health care provider may also give you more specific instructions. If you have problems or questions, contact your health care provider. What can I expect after the  procedure? After the procedure, it is common to have: Tiredness. Forgetfulness about what happened after the procedure. Impaired judgment for important decisions. Nausea or vomiting. Some difficulty with balance. Follow these instructions at home: For the time period you were told by your health care provider:   Rest as needed. Do not participate in activities where you could fall or become injured. Do not drive or use machinery. Do not drink alcohol. Do not take sleeping pills or medicines that cause drowsiness. Do not make important decisions or sign legal documents. Do not take care of children on your own. Eating and drinking Follow the diet that is recommended by your health care provider. Drink enough fluid to keep your urine pale yellow. If you vomit: Drink water, juice, or soup when you can drink without vomiting. Make sure you have little or no nausea before eating solid foods. General instructions Have a responsible adult stay with you for the time you are told. It is important to have someone help care for you until you are awake and alert. Take over-the-counter and prescription medicines only as told by your health care provider. If you have sleep apnea, surgery and certain medicines can increase your risk for breathing problems. Follow instructions from your health care provider about wearing your sleep device: Anytime you are sleeping, including during daytime naps. While taking prescription pain medicines, sleeping medicines, or medicines that make you drowsy. Avoid smoking. Keep all follow-up visits as told by your health care provider. This is important. Contact a health care provider if: You keep feeling nauseous or you keep vomiting. You feel light-headed. You are still sleepy or having trouble with balance after 24 hours. You develop a rash. You have a fever. You have redness or swelling around the IV site. Get help right away if: You have trouble  breathing. You have new-onset confusion at home. Summary For several hours after your procedure, you may feel tired. You may also be forgetful and have poor judgment. Have a responsible adult stay with you for the time you are told. It is important to have someone help care for you until you are awake and alert. Rest as told. Do not drive or operate machinery. Do not drink alcohol or take sleeping pills. Get help right away if you have trouble breathing, or if you suddenly become confused. This information is not intended to replace advice given to you by your health care provider. Make sure you discuss any questions you have with your health care provider. Document Revised: 08/04/2020 Document Reviewed: 10/22/2019 Elsevier Patient Education  2022 [employer].

## 2021-08-17 ENCOUNTER — Encounter (HOSPITAL_COMMUNITY): Payer: Self-pay

## 2021-08-17 ENCOUNTER — Encounter (HOSPITAL_COMMUNITY)
Admission: RE | Admit: 2021-08-17 | Discharge: 2021-08-17 | Disposition: A | Discharge status: Home / Self Care | Payer: Medicare Other | Source: Ambulatory Visit | Attending: Internal Medicine | Admitting: Internal Medicine

## 2021-08-17 ENCOUNTER — Telehealth: Payer: Self-pay | Admitting: *Deleted

## 2021-08-17 DIAGNOSIS — E785 Hyperlipidemia, unspecified: Secondary | ICD-10-CM | POA: Diagnosis not present

## 2021-08-17 DIAGNOSIS — J441 Chronic obstructive pulmonary disease with (acute) exacerbation: Secondary | ICD-10-CM | POA: Diagnosis not present

## 2021-08-17 DIAGNOSIS — Z9981 Dependence on supplemental oxygen: Secondary | ICD-10-CM | POA: Diagnosis not present

## 2021-08-17 DIAGNOSIS — Z7951 Long term (current) use of inhaled steroids: Secondary | ICD-10-CM | POA: Diagnosis not present

## 2021-08-17 DIAGNOSIS — F32A Depression, unspecified: Secondary | ICD-10-CM | POA: Diagnosis not present

## 2021-08-17 DIAGNOSIS — S72141D Displaced intertrochanteric fracture of right femur, subsequent encounter for closed fracture with routine healing: Secondary | ICD-10-CM | POA: Diagnosis not present

## 2021-08-17 DIAGNOSIS — D649 Anemia, unspecified: Secondary | ICD-10-CM | POA: Diagnosis not present

## 2021-08-17 DIAGNOSIS — J449 Chronic obstructive pulmonary disease, unspecified: Secondary | ICD-10-CM | POA: Diagnosis not present

## 2021-08-17 DIAGNOSIS — E039 Hypothyroidism, unspecified: Secondary | ICD-10-CM | POA: Diagnosis not present

## 2021-08-17 NOTE — Telephone Encounter (Signed)
Pt scheduled for Monday 9/19 with Dr. Abbey Chatters. When patient was last seen she was at [facility]. I called [facility] and was advised pt is no longer with them, I called pt at home # listed and she does not want to proceed with procedure at this time. Message sent to Endo to cancel.

## 2021-08-17 NOTE — Telephone Encounter (Signed)
-----   Message from Encarnacion Chu, RN sent at 08/17/2021  1:14 PM EDT ----- Regarding: no sjow Hey [place] [facility]ren! [facility] did not show for her PAT today.

## 2021-08-17 NOTE — Pre-Procedure Instructions (Signed)
Interoffice message sent to [place] @ [facility] because patient was a no show for her PAT. Blair Hailey, scheduler also notified.

## 2021-08-21 ENCOUNTER — Encounter (HOSPITAL_COMMUNITY): Admission: RE | Payer: Self-pay | Source: Ambulatory Visit

## 2021-08-21 ENCOUNTER — Ambulatory Visit (HOSPITAL_COMMUNITY): Admission: RE | Admit: 2021-08-21 | Payer: Medicare Other | Source: Ambulatory Visit

## 2021-08-21 DIAGNOSIS — E039 Hypothyroidism, unspecified: Secondary | ICD-10-CM | POA: Diagnosis not present

## 2021-08-21 DIAGNOSIS — D649 Anemia, unspecified: Secondary | ICD-10-CM | POA: Diagnosis not present

## 2021-08-21 DIAGNOSIS — J441 Chronic obstructive pulmonary disease with (acute) exacerbation: Secondary | ICD-10-CM | POA: Diagnosis not present

## 2021-08-21 DIAGNOSIS — Z9981 Dependence on supplemental oxygen: Secondary | ICD-10-CM | POA: Diagnosis not present

## 2021-08-21 DIAGNOSIS — J449 Chronic obstructive pulmonary disease, unspecified: Secondary | ICD-10-CM | POA: Diagnosis not present

## 2021-08-21 DIAGNOSIS — Z7951 Long term (current) use of inhaled steroids: Secondary | ICD-10-CM | POA: Diagnosis not present

## 2021-08-21 DIAGNOSIS — E785 Hyperlipidemia, unspecified: Secondary | ICD-10-CM | POA: Diagnosis not present

## 2021-08-21 DIAGNOSIS — S72141D Displaced intertrochanteric fracture of right femur, subsequent encounter for closed fracture with routine healing: Secondary | ICD-10-CM | POA: Diagnosis not present

## 2021-08-21 DIAGNOSIS — F32A Depression, unspecified: Secondary | ICD-10-CM | POA: Diagnosis not present

## 2021-08-21 SURGERY — ESOPHAGOGASTRODUODENOSCOPY (EGD) WITH PROPOFOL
Anesthesia: Monitor Anesthesia Care

## 2021-08-24 DIAGNOSIS — Z7951 Long term (current) use of inhaled steroids: Secondary | ICD-10-CM | POA: Diagnosis not present

## 2021-08-24 DIAGNOSIS — J441 Chronic obstructive pulmonary disease with (acute) exacerbation: Secondary | ICD-10-CM | POA: Diagnosis not present

## 2021-08-24 DIAGNOSIS — Z9981 Dependence on supplemental oxygen: Secondary | ICD-10-CM | POA: Diagnosis not present

## 2021-08-24 DIAGNOSIS — E039 Hypothyroidism, unspecified: Secondary | ICD-10-CM | POA: Diagnosis not present

## 2021-08-24 DIAGNOSIS — J449 Chronic obstructive pulmonary disease, unspecified: Secondary | ICD-10-CM | POA: Diagnosis not present

## 2021-08-24 DIAGNOSIS — S72141D Displaced intertrochanteric fracture of right femur, subsequent encounter for closed fracture with routine healing: Secondary | ICD-10-CM | POA: Diagnosis not present

## 2021-08-24 DIAGNOSIS — D649 Anemia, unspecified: Secondary | ICD-10-CM | POA: Diagnosis not present

## 2021-08-24 DIAGNOSIS — F32A Depression, unspecified: Secondary | ICD-10-CM | POA: Diagnosis not present

## 2021-08-24 DIAGNOSIS — E785 Hyperlipidemia, unspecified: Secondary | ICD-10-CM | POA: Diagnosis not present

## 2021-08-29 DIAGNOSIS — J441 Chronic obstructive pulmonary disease with (acute) exacerbation: Secondary | ICD-10-CM | POA: Diagnosis not present

## 2021-08-29 DIAGNOSIS — S72141D Displaced intertrochanteric fracture of right femur, subsequent encounter for closed fracture with routine healing: Secondary | ICD-10-CM | POA: Diagnosis not present

## 2021-08-29 DIAGNOSIS — F32A Depression, unspecified: Secondary | ICD-10-CM | POA: Diagnosis not present

## 2021-08-29 DIAGNOSIS — E785 Hyperlipidemia, unspecified: Secondary | ICD-10-CM | POA: Diagnosis not present

## 2021-08-29 DIAGNOSIS — E039 Hypothyroidism, unspecified: Secondary | ICD-10-CM | POA: Diagnosis not present

## 2021-08-29 DIAGNOSIS — Z9981 Dependence on supplemental oxygen: Secondary | ICD-10-CM | POA: Diagnosis not present

## 2021-08-29 DIAGNOSIS — J449 Chronic obstructive pulmonary disease, unspecified: Secondary | ICD-10-CM | POA: Diagnosis not present

## 2021-08-29 DIAGNOSIS — Z7951 Long term (current) use of inhaled steroids: Secondary | ICD-10-CM | POA: Diagnosis not present

## 2021-08-29 DIAGNOSIS — D649 Anemia, unspecified: Secondary | ICD-10-CM | POA: Diagnosis not present

## 2021-08-31 DIAGNOSIS — R269 Unspecified abnormalities of gait and mobility: Secondary | ICD-10-CM | POA: Diagnosis not present

## 2021-08-31 DIAGNOSIS — J441 Chronic obstructive pulmonary disease with (acute) exacerbation: Secondary | ICD-10-CM | POA: Diagnosis not present

## 2021-08-31 DIAGNOSIS — E039 Hypothyroidism, unspecified: Secondary | ICD-10-CM | POA: Diagnosis not present

## 2021-08-31 DIAGNOSIS — J449 Chronic obstructive pulmonary disease, unspecified: Secondary | ICD-10-CM | POA: Diagnosis not present

## 2021-09-05 DIAGNOSIS — D649 Anemia, unspecified: Secondary | ICD-10-CM | POA: Diagnosis not present

## 2021-09-05 DIAGNOSIS — F32A Depression, unspecified: Secondary | ICD-10-CM | POA: Diagnosis not present

## 2021-09-05 DIAGNOSIS — J441 Chronic obstructive pulmonary disease with (acute) exacerbation: Secondary | ICD-10-CM | POA: Diagnosis not present

## 2021-09-05 DIAGNOSIS — J449 Chronic obstructive pulmonary disease, unspecified: Secondary | ICD-10-CM | POA: Diagnosis not present

## 2021-09-05 DIAGNOSIS — S72141D Displaced intertrochanteric fracture of right femur, subsequent encounter for closed fracture with routine healing: Secondary | ICD-10-CM | POA: Diagnosis not present

## 2021-09-05 DIAGNOSIS — Z7951 Long term (current) use of inhaled steroids: Secondary | ICD-10-CM | POA: Diagnosis not present

## 2021-09-05 DIAGNOSIS — E785 Hyperlipidemia, unspecified: Secondary | ICD-10-CM | POA: Diagnosis not present

## 2021-09-05 DIAGNOSIS — Z9981 Dependence on supplemental oxygen: Secondary | ICD-10-CM | POA: Diagnosis not present

## 2021-09-05 DIAGNOSIS — E039 Hypothyroidism, unspecified: Secondary | ICD-10-CM | POA: Diagnosis not present

## 2021-09-11 ENCOUNTER — Ambulatory Visit: Payer: Medicare Other | Admitting: Adult Health

## 2021-09-11 ENCOUNTER — Encounter: Payer: Self-pay | Admitting: Adult Health

## 2021-09-11 DIAGNOSIS — D649 Anemia, unspecified: Secondary | ICD-10-CM | POA: Diagnosis not present

## 2021-09-11 DIAGNOSIS — J449 Chronic obstructive pulmonary disease, unspecified: Secondary | ICD-10-CM | POA: Diagnosis not present

## 2021-09-11 DIAGNOSIS — S72141D Displaced intertrochanteric fracture of right femur, subsequent encounter for closed fracture with routine healing: Secondary | ICD-10-CM | POA: Diagnosis not present

## 2021-09-11 DIAGNOSIS — J441 Chronic obstructive pulmonary disease with (acute) exacerbation: Secondary | ICD-10-CM | POA: Diagnosis not present

## 2021-09-11 DIAGNOSIS — Z7951 Long term (current) use of inhaled steroids: Secondary | ICD-10-CM | POA: Diagnosis not present

## 2021-09-11 DIAGNOSIS — Z9981 Dependence on supplemental oxygen: Secondary | ICD-10-CM | POA: Diagnosis not present

## 2021-09-11 DIAGNOSIS — F32A Depression, unspecified: Secondary | ICD-10-CM | POA: Diagnosis not present

## 2021-09-11 DIAGNOSIS — E785 Hyperlipidemia, unspecified: Secondary | ICD-10-CM | POA: Diagnosis not present

## 2021-09-11 DIAGNOSIS — E039 Hypothyroidism, unspecified: Secondary | ICD-10-CM | POA: Diagnosis not present

## 2021-09-18 DIAGNOSIS — E785 Hyperlipidemia, unspecified: Secondary | ICD-10-CM | POA: Diagnosis not present

## 2021-09-18 DIAGNOSIS — Z7951 Long term (current) use of inhaled steroids: Secondary | ICD-10-CM | POA: Diagnosis not present

## 2021-09-18 DIAGNOSIS — D649 Anemia, unspecified: Secondary | ICD-10-CM | POA: Diagnosis not present

## 2021-09-18 DIAGNOSIS — S72141D Displaced intertrochanteric fracture of right femur, subsequent encounter for closed fracture with routine healing: Secondary | ICD-10-CM | POA: Diagnosis not present

## 2021-09-18 DIAGNOSIS — E039 Hypothyroidism, unspecified: Secondary | ICD-10-CM | POA: Diagnosis not present

## 2021-09-18 DIAGNOSIS — F32A Depression, unspecified: Secondary | ICD-10-CM | POA: Diagnosis not present

## 2021-09-18 DIAGNOSIS — J449 Chronic obstructive pulmonary disease, unspecified: Secondary | ICD-10-CM | POA: Diagnosis not present

## 2021-09-18 DIAGNOSIS — J441 Chronic obstructive pulmonary disease with (acute) exacerbation: Secondary | ICD-10-CM | POA: Diagnosis not present

## 2021-09-18 DIAGNOSIS — Z9981 Dependence on supplemental oxygen: Secondary | ICD-10-CM | POA: Diagnosis not present

## 2021-09-23 DIAGNOSIS — J449 Chronic obstructive pulmonary disease, unspecified: Secondary | ICD-10-CM | POA: Diagnosis not present

## 2021-09-27 DIAGNOSIS — J441 Chronic obstructive pulmonary disease with (acute) exacerbation: Secondary | ICD-10-CM | POA: Diagnosis not present

## 2021-09-27 DIAGNOSIS — Z7951 Long term (current) use of inhaled steroids: Secondary | ICD-10-CM | POA: Diagnosis not present

## 2021-09-27 DIAGNOSIS — S72141D Displaced intertrochanteric fracture of right femur, subsequent encounter for closed fracture with routine healing: Secondary | ICD-10-CM | POA: Diagnosis not present

## 2021-09-27 DIAGNOSIS — J449 Chronic obstructive pulmonary disease, unspecified: Secondary | ICD-10-CM | POA: Diagnosis not present

## 2021-09-27 DIAGNOSIS — D649 Anemia, unspecified: Secondary | ICD-10-CM | POA: Diagnosis not present

## 2021-09-27 DIAGNOSIS — E785 Hyperlipidemia, unspecified: Secondary | ICD-10-CM | POA: Diagnosis not present

## 2021-09-27 DIAGNOSIS — I872 Venous insufficiency (chronic) (peripheral): Secondary | ICD-10-CM | POA: Diagnosis not present

## 2021-09-27 DIAGNOSIS — B351 Tinea unguium: Secondary | ICD-10-CM | POA: Diagnosis not present

## 2021-09-27 DIAGNOSIS — I7091 Generalized atherosclerosis: Secondary | ICD-10-CM | POA: Diagnosis not present

## 2021-09-27 DIAGNOSIS — Z9981 Dependence on supplemental oxygen: Secondary | ICD-10-CM | POA: Diagnosis not present

## 2021-09-27 DIAGNOSIS — E039 Hypothyroidism, unspecified: Secondary | ICD-10-CM | POA: Diagnosis not present

## 2021-09-27 DIAGNOSIS — F32A Depression, unspecified: Secondary | ICD-10-CM | POA: Diagnosis not present

## 2021-09-28 DIAGNOSIS — R269 Unspecified abnormalities of gait and mobility: Secondary | ICD-10-CM | POA: Diagnosis not present

## 2021-09-28 DIAGNOSIS — J441 Chronic obstructive pulmonary disease with (acute) exacerbation: Secondary | ICD-10-CM | POA: Diagnosis not present

## 2021-09-28 DIAGNOSIS — F02B0 Dementia in other diseases classified elsewhere, moderate, without behavioral disturbance, psychotic disturbance, mood disturbance, and anxiety: Secondary | ICD-10-CM | POA: Diagnosis not present

## 2021-09-28 DIAGNOSIS — G301 Alzheimer's disease with late onset: Secondary | ICD-10-CM | POA: Diagnosis not present

## 2021-09-30 DIAGNOSIS — J449 Chronic obstructive pulmonary disease, unspecified: Secondary | ICD-10-CM | POA: Diagnosis not present

## 2021-10-02 DIAGNOSIS — S72141D Displaced intertrochanteric fracture of right femur, subsequent encounter for closed fracture with routine healing: Secondary | ICD-10-CM | POA: Diagnosis not present

## 2021-10-02 DIAGNOSIS — D649 Anemia, unspecified: Secondary | ICD-10-CM | POA: Diagnosis not present

## 2021-10-02 DIAGNOSIS — Z7951 Long term (current) use of inhaled steroids: Secondary | ICD-10-CM | POA: Diagnosis not present

## 2021-10-02 DIAGNOSIS — Z9981 Dependence on supplemental oxygen: Secondary | ICD-10-CM | POA: Diagnosis not present

## 2021-10-02 DIAGNOSIS — E785 Hyperlipidemia, unspecified: Secondary | ICD-10-CM | POA: Diagnosis not present

## 2021-10-02 DIAGNOSIS — J449 Chronic obstructive pulmonary disease, unspecified: Secondary | ICD-10-CM | POA: Diagnosis not present

## 2021-10-02 DIAGNOSIS — F32A Depression, unspecified: Secondary | ICD-10-CM | POA: Diagnosis not present

## 2021-10-02 DIAGNOSIS — J441 Chronic obstructive pulmonary disease with (acute) exacerbation: Secondary | ICD-10-CM | POA: Diagnosis not present

## 2021-10-02 DIAGNOSIS — E039 Hypothyroidism, unspecified: Secondary | ICD-10-CM | POA: Diagnosis not present

## 2021-10-06 ENCOUNTER — Ambulatory Visit: Payer: Medicare Other | Admitting: Gastroenterology

## 2021-10-12 DIAGNOSIS — J449 Chronic obstructive pulmonary disease, unspecified: Secondary | ICD-10-CM | POA: Diagnosis not present

## 2021-10-19 DIAGNOSIS — G301 Alzheimer's disease with late onset: Secondary | ICD-10-CM | POA: Diagnosis not present

## 2021-10-19 DIAGNOSIS — F02B0 Dementia in other diseases classified elsewhere, moderate, without behavioral disturbance, psychotic disturbance, mood disturbance, and anxiety: Secondary | ICD-10-CM | POA: Diagnosis not present

## 2021-10-19 DIAGNOSIS — Z9981 Dependence on supplemental oxygen: Secondary | ICD-10-CM | POA: Diagnosis not present

## 2021-10-19 DIAGNOSIS — J441 Chronic obstructive pulmonary disease with (acute) exacerbation: Secondary | ICD-10-CM | POA: Diagnosis not present

## 2021-11-11 DIAGNOSIS — J449 Chronic obstructive pulmonary disease, unspecified: Secondary | ICD-10-CM | POA: Diagnosis not present

## 2021-11-14 ENCOUNTER — Emergency Department: Payer: Medicare Other

## 2021-11-14 ENCOUNTER — Encounter: Payer: Self-pay | Admitting: *Deleted

## 2021-11-14 ENCOUNTER — Other Ambulatory Visit: Payer: Self-pay

## 2021-11-14 ENCOUNTER — Emergency Department
Admission: EM | Admit: 2021-11-14 | Discharge: 2021-11-14 | Disposition: A | Discharge status: Home / Self Care | Payer: Medicare Other | Source: Home / Self Care | Attending: Emergency Medicine | Admitting: Emergency Medicine

## 2021-11-14 ENCOUNTER — Encounter: Payer: Self-pay | Admitting: Emergency Medicine

## 2021-11-14 ENCOUNTER — Inpatient Hospital Stay
Admission: EM | Admit: 2021-11-14 | Discharge: 2021-11-16 | DRG: 744 | Disposition: A | Discharge status: Nursing Home (Includes ICF) | Payer: Medicare Other | Source: Skilled Nursing Facility | Attending: Internal Medicine | Admitting: Internal Medicine

## 2021-11-14 DIAGNOSIS — J432 Centrilobular emphysema: Secondary | ICD-10-CM | POA: Diagnosis not present

## 2021-11-14 DIAGNOSIS — E7800 Pure hypercholesterolemia, unspecified: Secondary | ICD-10-CM | POA: Diagnosis present

## 2021-11-14 DIAGNOSIS — R404 Transient alteration of awareness: Secondary | ICD-10-CM | POA: Diagnosis not present

## 2021-11-14 DIAGNOSIS — C539 Malignant neoplasm of cervix uteri, unspecified: Secondary | ICD-10-CM | POA: Diagnosis not present

## 2021-11-14 DIAGNOSIS — N939 Abnormal uterine and vaginal bleeding, unspecified: Secondary | ICD-10-CM

## 2021-11-14 DIAGNOSIS — Z743 Need for continuous supervision: Secondary | ICD-10-CM | POA: Diagnosis not present

## 2021-11-14 DIAGNOSIS — J438 Other emphysema: Secondary | ICD-10-CM | POA: Diagnosis not present

## 2021-11-14 DIAGNOSIS — N1330 Unspecified hydronephrosis: Secondary | ICD-10-CM | POA: Diagnosis present

## 2021-11-14 DIAGNOSIS — Z79899 Other long term (current) drug therapy: Secondary | ICD-10-CM

## 2021-11-14 DIAGNOSIS — D62 Acute posthemorrhagic anemia: Secondary | ICD-10-CM | POA: Diagnosis present

## 2021-11-14 DIAGNOSIS — Z87891 Personal history of nicotine dependence: Secondary | ICD-10-CM

## 2021-11-14 DIAGNOSIS — R5381 Other malaise: Secondary | ICD-10-CM | POA: Diagnosis not present

## 2021-11-14 DIAGNOSIS — Z8719 Personal history of other diseases of the digestive system: Secondary | ICD-10-CM | POA: Diagnosis not present

## 2021-11-14 DIAGNOSIS — Z7951 Long term (current) use of inhaled steroids: Secondary | ICD-10-CM | POA: Insufficient documentation

## 2021-11-14 DIAGNOSIS — R9431 Abnormal electrocardiogram [ECG] [EKG]: Secondary | ICD-10-CM | POA: Diagnosis present

## 2021-11-14 DIAGNOSIS — Z20822 Contact with and (suspected) exposure to covid-19: Secondary | ICD-10-CM | POA: Diagnosis present

## 2021-11-14 DIAGNOSIS — R58 Hemorrhage, not elsewhere classified: Secondary | ICD-10-CM | POA: Diagnosis not present

## 2021-11-14 DIAGNOSIS — E039 Hypothyroidism, unspecified: Secondary | ICD-10-CM | POA: Diagnosis present

## 2021-11-14 DIAGNOSIS — Z880 Allergy status to penicillin: Secondary | ICD-10-CM

## 2021-11-14 DIAGNOSIS — Z79890 Hormone replacement therapy (postmenopausal): Secondary | ICD-10-CM

## 2021-11-14 DIAGNOSIS — J441 Chronic obstructive pulmonary disease with (acute) exacerbation: Secondary | ICD-10-CM | POA: Insufficient documentation

## 2021-11-14 DIAGNOSIS — R6889 Other general symptoms and signs: Secondary | ICD-10-CM | POA: Diagnosis not present

## 2021-11-14 DIAGNOSIS — N83299 Other ovarian cyst, unspecified side: Secondary | ICD-10-CM | POA: Diagnosis present

## 2021-11-14 DIAGNOSIS — F0390 Unspecified dementia without behavioral disturbance: Secondary | ICD-10-CM | POA: Diagnosis present

## 2021-11-14 DIAGNOSIS — J449 Chronic obstructive pulmonary disease, unspecified: Secondary | ICD-10-CM | POA: Diagnosis present

## 2021-11-14 DIAGNOSIS — R69 Illness, unspecified: Secondary | ICD-10-CM | POA: Diagnosis not present

## 2021-11-14 LAB — COMPREHENSIVE METABOLIC PANEL
ALT: 10 U/L (ref 0–44)
AST: 15 U/L (ref 15–41)
Albumin: 3.1 g/dL — ABNORMAL LOW (ref 3.5–5.0)
Alkaline Phosphatase: 74 U/L (ref 38–126)
Anion gap: 5 (ref 5–15)
BUN: 16 mg/dL (ref 8–23)
CO2: 29 mmol/L (ref 22–32)
Calcium: 8.4 mg/dL — ABNORMAL LOW (ref 8.9–10.3)
Chloride: 103 mmol/L (ref 98–111)
Creatinine, Ser: 1 mg/dL (ref 0.44–1.00)
GFR, Estimated: 58 mL/min — ABNORMAL LOW (ref >60)
Glucose, Bld: 143 mg/dL — ABNORMAL HIGH (ref 70–99)
Potassium: 3.7 mmol/L (ref 3.5–5.1)
Sodium: 137 mmol/L (ref 135–145)
Total Bilirubin: 0.5 mg/dL (ref 0.3–1.2)
Total Protein: 6.1 g/dL — ABNORMAL LOW (ref 6.5–8.1)

## 2021-11-14 LAB — URINALYSIS, MICROSCOPIC (REFLEX)

## 2021-11-14 LAB — CBC WITH DIFFERENTIAL/PLATELET
Abs Immature Granulocytes: 0.04 10*3/uL (ref 0.00–0.07)
Basophils Absolute: 0 10*3/uL (ref 0.0–0.1)
Basophils Relative: 1 %
Eosinophils Absolute: 0.1 10*3/uL (ref 0.0–0.5)
Eosinophils Relative: 1 %
HCT: 28.2 % — ABNORMAL LOW (ref 36.0–46.0)
Hemoglobin: 9 g/dL — ABNORMAL LOW (ref 12.0–15.0)
Immature Granulocytes: 1 %
Lymphocytes Relative: 10 %
Lymphs Abs: 0.8 10*3/uL (ref 0.7–4.0)
MCH: 29.9 pg (ref 26.0–34.0)
MCHC: 31.9 g/dL (ref 30.0–36.0)
MCV: 93.7 fL (ref 80.0–100.0)
Monocytes Absolute: 0.6 10*3/uL (ref 0.1–1.0)
Monocytes Relative: 7 %
Neutro Abs: 6.8 10*3/uL (ref 1.7–7.7)
Neutrophils Relative %: 80 %
Platelets: 388 10*3/uL (ref 150–400)
RBC: 3.01 MIL/uL — ABNORMAL LOW (ref 3.87–5.11)
RDW: 12.4 % (ref 11.5–15.5)
WBC: 8.3 10*3/uL (ref 4.0–10.5)
nRBC: 0 % (ref 0.0–0.2)

## 2021-11-14 LAB — CBC
HCT: 27.4 % — ABNORMAL LOW (ref 36.0–46.0)
Hemoglobin: 8.8 g/dL — ABNORMAL LOW (ref 12.0–15.0)
MCH: 30 pg (ref 26.0–34.0)
MCHC: 32.1 g/dL (ref 30.0–36.0)
MCV: 93.5 fL (ref 80.0–100.0)
Platelets: 380 10*3/uL (ref 150–400)
RBC: 2.93 MIL/uL — ABNORMAL LOW (ref 3.87–5.11)
RDW: 12.5 % (ref 11.5–15.5)
WBC: 10.6 10*3/uL — ABNORMAL HIGH (ref 4.0–10.5)
nRBC: 0 % (ref 0.0–0.2)

## 2021-11-14 LAB — BASIC METABOLIC PANEL
Anion gap: 7 (ref 5–15)
BUN: 15 mg/dL (ref 8–23)
CO2: 28 mmol/L (ref 22–32)
Calcium: 8.6 mg/dL — ABNORMAL LOW (ref 8.9–10.3)
Chloride: 103 mmol/L (ref 98–111)
Creatinine, Ser: 0.86 mg/dL (ref 0.44–1.00)
GFR, Estimated: >60 mL/min (ref >60)
Glucose, Bld: 116 mg/dL — ABNORMAL HIGH (ref 70–99)
Potassium: 3.8 mmol/L (ref 3.5–5.1)
Sodium: 138 mmol/L (ref 135–145)

## 2021-11-14 LAB — URINALYSIS, ROUTINE W REFLEX MICROSCOPIC
Bilirubin Urine: NEGATIVE
Glucose, UA: NEGATIVE mg/dL
Hgb urine dipstick: NEGATIVE
Ketones, ur: NEGATIVE mg/dL
Leukocytes,Ua: NEGATIVE
Nitrite: NEGATIVE
Specific Gravity, Urine: 1.02 (ref 1.005–1.030)
pH: 5.5 (ref 5.0–8.0)

## 2021-11-14 LAB — RESP PANEL BY RT-PCR (FLU A&B, COVID) ARPGX2
Influenza A by PCR: NEGATIVE
Influenza B by PCR: NEGATIVE
SARS Coronavirus 2 by RT PCR: NEGATIVE

## 2021-11-14 LAB — PROTIME-INR
INR: 1 (ref 0.8–1.2)
Prothrombin Time: 13.5 seconds (ref 11.4–15.2)

## 2021-11-14 LAB — MAGNESIUM: Magnesium: 2.3 mg/dL (ref 1.7–2.4)

## 2021-11-14 MED ORDER — TRANEXAMIC ACID 650 MG PO TABS
1300.0000 mg | ORAL_TABLET | Freq: Once | ORAL | Status: AC
  Administered 2021-11-14: 1300 mg via ORAL
  Filled 2021-11-14: qty 2

## 2021-11-14 NOTE — ED Notes (Signed)
On vaginal and rectal exam. No bleeding was present. There was scant blood on the pt's brief.

## 2021-11-14 NOTE — ED Notes (Signed)
[facility] called for transport back to [facility]

## 2021-11-14 NOTE — ED Notes (Signed)
Report given to [place] [facility] at [facility]. Pt's legal guardian was called and is aware of pt being discharged.

## 2021-11-14 NOTE — ED Triage Notes (Addendum)
Pt coming from [facility] via [facility]. Per EMS, facility pt has been having vaginal bleeding for the past 2 days.   Pt denies any pain at the moment. Pt does wear 4L [place] at the facility but it is unknown why pt wears oxygen.

## 2021-11-14 NOTE — ED Triage Notes (Signed)
Pt brought in via [facility] from [facility].  Pt has vaginal bleeding.  Pt was seen here earlier today with similar sx.  Pt denies any pain.  No v/d  no urinary sx.  Pt alert.

## 2021-11-14 NOTE — Discharge Instructions (Signed)
Follow-up with the gynecologist within the next week to 10 days.  Return to the ER immediately for new, worsening, or persistent bleeding, passage of a larger amount of blood or clots, abdominal or pelvic pain, weakness or lightheadedness, or any other new or worsening symptoms that concern you.
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
## 2021-11-14 NOTE — ED Triage Notes (Signed)
EMS brings pt in from [place]; was just seen and d/c from ED for vag bleeding; pt cont to have bleeding

## 2021-11-14 NOTE — ED Notes (Signed)
Placed on purwick device.
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
## 2021-11-15 ENCOUNTER — Encounter: Payer: Self-pay | Admitting: Internal Medicine

## 2021-11-15 ENCOUNTER — Other Ambulatory Visit: Payer: Self-pay

## 2021-11-15 ENCOUNTER — Encounter
Admission: EM | Disposition: A | Discharge status: Skilled Nursing Facility | Payer: Self-pay | Source: Home / Self Care | Attending: Internal Medicine

## 2021-11-15 ENCOUNTER — Telehealth: Payer: Self-pay

## 2021-11-15 ENCOUNTER — Inpatient Hospital Stay: Payer: Medicare Other | Admitting: Anesthesiology

## 2021-11-15 DIAGNOSIS — D62 Acute posthemorrhagic anemia: Secondary | ICD-10-CM | POA: Diagnosis present

## 2021-11-15 DIAGNOSIS — Z7401 Bed confinement status: Secondary | ICD-10-CM | POA: Diagnosis not present

## 2021-11-15 DIAGNOSIS — F0390 Unspecified dementia without behavioral disturbance: Secondary | ICD-10-CM | POA: Diagnosis present

## 2021-11-15 DIAGNOSIS — R6889 Other general symptoms and signs: Secondary | ICD-10-CM | POA: Diagnosis not present

## 2021-11-15 DIAGNOSIS — D649 Anemia, unspecified: Secondary | ICD-10-CM | POA: Diagnosis not present

## 2021-11-15 DIAGNOSIS — N888 Other specified noninflammatory disorders of cervix uteri: Secondary | ICD-10-CM

## 2021-11-15 DIAGNOSIS — N939 Abnormal uterine and vaginal bleeding, unspecified: Secondary | ICD-10-CM | POA: Diagnosis present

## 2021-11-15 DIAGNOSIS — N83299 Other ovarian cyst, unspecified side: Secondary | ICD-10-CM | POA: Diagnosis present

## 2021-11-15 DIAGNOSIS — Z79890 Hormone replacement therapy (postmenopausal): Secondary | ICD-10-CM | POA: Diagnosis not present

## 2021-11-15 DIAGNOSIS — E039 Hypothyroidism, unspecified: Secondary | ICD-10-CM | POA: Diagnosis present

## 2021-11-15 DIAGNOSIS — I2510 Atherosclerotic heart disease of native coronary artery without angina pectoris: Secondary | ICD-10-CM | POA: Diagnosis not present

## 2021-11-15 DIAGNOSIS — Z8719 Personal history of other diseases of the digestive system: Secondary | ICD-10-CM

## 2021-11-15 DIAGNOSIS — J432 Centrilobular emphysema: Secondary | ICD-10-CM | POA: Diagnosis present

## 2021-11-15 DIAGNOSIS — Z79899 Other long term (current) drug therapy: Secondary | ICD-10-CM | POA: Diagnosis not present

## 2021-11-15 DIAGNOSIS — N1330 Unspecified hydronephrosis: Secondary | ICD-10-CM | POA: Diagnosis not present

## 2021-11-15 DIAGNOSIS — I358 Other nonrheumatic aortic valve disorders: Secondary | ICD-10-CM | POA: Diagnosis not present

## 2021-11-15 DIAGNOSIS — Z743 Need for continuous supervision: Secondary | ICD-10-CM | POA: Diagnosis not present

## 2021-11-15 DIAGNOSIS — Z880 Allergy status to penicillin: Secondary | ICD-10-CM | POA: Diagnosis not present

## 2021-11-15 DIAGNOSIS — R1900 Intra-abdominal and pelvic swelling, mass and lump, unspecified site: Secondary | ICD-10-CM | POA: Diagnosis not present

## 2021-11-15 DIAGNOSIS — R9431 Abnormal electrocardiogram [ECG] [EKG]: Secondary | ICD-10-CM | POA: Diagnosis present

## 2021-11-15 DIAGNOSIS — Z20822 Contact with and (suspected) exposure to covid-19: Secondary | ICD-10-CM | POA: Diagnosis present

## 2021-11-15 DIAGNOSIS — E890 Postprocedural hypothyroidism: Secondary | ICD-10-CM | POA: Diagnosis not present

## 2021-11-15 DIAGNOSIS — I771 Stricture of artery: Secondary | ICD-10-CM | POA: Diagnosis not present

## 2021-11-15 DIAGNOSIS — K5730 Diverticulosis of large intestine without perforation or abscess without bleeding: Secondary | ICD-10-CM | POA: Diagnosis not present

## 2021-11-15 DIAGNOSIS — J438 Other emphysema: Secondary | ICD-10-CM | POA: Diagnosis present

## 2021-11-15 DIAGNOSIS — Z87891 Personal history of nicotine dependence: Secondary | ICD-10-CM | POA: Diagnosis not present

## 2021-11-15 DIAGNOSIS — E7800 Pure hypercholesterolemia, unspecified: Secondary | ICD-10-CM | POA: Diagnosis present

## 2021-11-15 DIAGNOSIS — C539 Malignant neoplasm of cervix uteri, unspecified: Secondary | ICD-10-CM | POA: Diagnosis present

## 2021-11-15 HISTORY — PX: DILATION AND CURETTAGE OF UTERUS: SHX78

## 2021-11-15 LAB — CBC WITH DIFFERENTIAL/PLATELET
Abs Immature Granulocytes: 0.03 10*3/uL (ref 0.00–0.07)
Basophils Absolute: 0 10*3/uL (ref 0.0–0.1)
Basophils Relative: 0 %
Eosinophils Absolute: 0.1 10*3/uL (ref 0.0–0.5)
Eosinophils Relative: 1 %
HCT: 26.8 % — ABNORMAL LOW (ref 36.0–46.0)
Hemoglobin: 8.8 g/dL — ABNORMAL LOW (ref 12.0–15.0)
Immature Granulocytes: 0 %
Lymphocytes Relative: 15 %
Lymphs Abs: 1.4 10*3/uL (ref 0.7–4.0)
MCH: 30.4 pg (ref 26.0–34.0)
MCHC: 32.8 g/dL (ref 30.0–36.0)
MCV: 92.7 fL (ref 80.0–100.0)
Monocytes Absolute: 0.7 10*3/uL (ref 0.1–1.0)
Monocytes Relative: 8 %
Neutro Abs: 7.2 10*3/uL (ref 1.7–7.7)
Neutrophils Relative %: 76 %
Platelets: 353 10*3/uL (ref 150–400)
RBC: 2.89 MIL/uL — ABNORMAL LOW (ref 3.87–5.11)
RDW: 13.1 % (ref 11.5–15.5)
WBC: 9.5 10*3/uL (ref 4.0–10.5)
nRBC: 0 % (ref 0.0–0.2)

## 2021-11-15 LAB — TYPE AND SCREEN
ABO/RH(D): O POS
Antibody Screen: NEGATIVE

## 2021-11-15 LAB — POCT I-STAT, CHEM 8
BUN: 14 mg/dL (ref 8–23)
Calcium, Ion: 1.16 mmol/L (ref 1.15–1.40)
Chloride: 102 mmol/L (ref 98–111)
Creatinine, Ser: 0.8 mg/dL (ref 0.44–1.00)
Glucose, Bld: 94 mg/dL (ref 70–99)
HCT: 23 % — ABNORMAL LOW (ref 36.0–46.0)
Hemoglobin: 7.8 g/dL — ABNORMAL LOW (ref 12.0–15.0)
Potassium: 4 mmol/L (ref 3.5–5.1)
Sodium: 142 mmol/L (ref 135–145)
TCO2: 26 mmol/L (ref 22–32)

## 2021-11-15 LAB — HEMOGLOBIN AND HEMATOCRIT, BLOOD
HCT: 22.7 % — ABNORMAL LOW (ref 36.0–46.0)
Hemoglobin: 7.3 g/dL — ABNORMAL LOW (ref 12.0–15.0)

## 2021-11-15 SURGERY — DILATION AND CURETTAGE
Anesthesia: General

## 2021-11-15 MED ORDER — OXYCODONE HCL 5 MG PO TABS
5.0000 mg | ORAL_TABLET | Freq: Once | ORAL | Status: AC | PRN
  Administered 2021-11-15: 14:00:00 5 mg via ORAL

## 2021-11-15 MED ORDER — VITAMIN D 25 MCG (1000 UNIT) PO TABS
1000.0000 [IU] | ORAL_TABLET | Freq: Every day | ORAL | Status: DC
  Administered 2021-11-15: 18:00:00 1000 [IU] via ORAL
  Filled 2021-11-15 (×2): qty 1

## 2021-11-15 MED ORDER — FENTANYL CITRATE (PF) 100 MCG/2ML IJ SOLN
25.0000 ug | INTRAMUSCULAR | Status: DC | PRN
  Administered 2021-11-15 (×3): 25 ug via INTRAVENOUS

## 2021-11-15 MED ORDER — SILVER NITRATE-POT NITRATE 75-25 % EX MISC: CUTANEOUS | Status: DC | PRN

## 2021-11-15 MED ORDER — FENTANYL CITRATE (PF) 100 MCG/2ML IJ SOLN
INTRAMUSCULAR | Status: DC | PRN
  Administered 2021-11-15 (×3): 25 ug via INTRAVENOUS

## 2021-11-15 MED ORDER — FENTANYL CITRATE (PF) 100 MCG/2ML IJ SOLN
INTRAMUSCULAR | Status: AC
  Filled 2021-11-15: qty 2

## 2021-11-15 MED ORDER — SODIUM CHLORIDE 0.9 % IV SOLN: INTRAVENOUS | Status: DC | PRN

## 2021-11-15 MED ORDER — UMECLIDINIUM BROMIDE 62.5 MCG/ACT IN AEPB
1.0000 | INHALATION_SPRAY | Freq: Every day | RESPIRATORY_TRACT | Status: DC
  Administered 2021-11-15 – 2021-11-16 (×2): 1 via RESPIRATORY_TRACT
  Filled 2021-11-15: qty 7

## 2021-11-15 MED ORDER — CITALOPRAM HYDROBROMIDE 20 MG PO TABS: 10.0000 mg | ORAL_TABLET | Freq: Every day | ORAL | Status: DC

## 2021-11-15 MED ORDER — OXYCODONE HCL 5 MG/5ML PO SOLN: 5.0000 mg | Freq: Once | ORAL | Status: AC | PRN

## 2021-11-15 MED ORDER — ADULT MULTIVITAMIN W/MINERALS CH
1.0000 | ORAL_TABLET | Freq: Every day | ORAL | Status: DC
Start: 2021-11-15 — End: 2021-11-16
  Administered 2021-11-15: 18:00:00 1 via ORAL
  Filled 2021-11-15: qty 1

## 2021-11-15 MED ORDER — SILVER NITRATE-POT NITRATE 75-25 % EX MISC
CUTANEOUS | Status: AC
  Filled 2021-11-15: qty 10

## 2021-11-15 MED ORDER — SODIUM CHLORIDE 0.9% IV SOLUTION
Freq: Once | INTRAVENOUS | Status: DC
  Filled 2021-11-15: qty 250

## 2021-11-15 MED ORDER — ACETAMINOPHEN 650 MG RE SUPP
650.0000 mg | Freq: Four times a day (QID) | RECTAL | Status: DC | PRN
  Filled 2021-11-15: qty 1

## 2021-11-15 MED ORDER — FERRIC SUBSULFATE 259 MG/GM EX SOLN
CUTANEOUS | Status: AC
  Filled 2021-11-15: qty 8

## 2021-11-15 MED ORDER — ALPRAZOLAM 0.5 MG PO TABS
0.5000 mg | ORAL_TABLET | Freq: Every morning | ORAL | Status: DC
  Administered 2021-11-16: 0.5 mg via ORAL
  Filled 2021-11-15: qty 1

## 2021-11-15 MED ORDER — PHENYLEPHRINE HCL-NACL 20-0.9 MG/250ML-% IV SOLN
INTRAVENOUS | Status: DC | PRN
  Administered 2021-11-15: 50 ug/min via INTRAVENOUS

## 2021-11-15 MED ORDER — LEVOTHYROXINE SODIUM 50 MCG PO TABS
75.0000 ug | ORAL_TABLET | Freq: Every day | ORAL | Status: DC
  Administered 2021-11-16: 75 ug via ORAL
  Filled 2021-11-15: qty 2

## 2021-11-15 MED ORDER — TIOTROPIUM BROMIDE MONOHYDRATE 18 MCG IN CAPS
1.0000 | ORAL_CAPSULE | Freq: Every day | RESPIRATORY_TRACT | Status: DC
  Administered 2021-11-16: 18 ug via RESPIRATORY_TRACT
  Filled 2021-11-15: qty 5

## 2021-11-15 MED ORDER — OXYCODONE HCL 5 MG PO TABS
ORAL_TABLET | ORAL | Status: AC
  Filled 2021-11-15: qty 1

## 2021-11-15 MED ORDER — ACETAMINOPHEN 325 MG PO TABS
650.0000 mg | ORAL_TABLET | Freq: Four times a day (QID) | ORAL | Status: DC | PRN
  Administered 2021-11-16: 650 mg via ORAL
  Filled 2021-11-15: qty 2

## 2021-11-15 MED ORDER — LIDOCAINE HCL (CARDIAC) PF 100 MG/5ML IV SOSY
PREFILLED_SYRINGE | INTRAVENOUS | Status: DC | PRN
  Administered 2021-11-15: 60 mg via INTRAVENOUS

## 2021-11-15 MED ORDER — FERRIC SUBSULFATE 259 MG/GM EX SOLN
CUTANEOUS | Status: DC | PRN
  Administered 2021-11-15: 1

## 2021-11-15 MED ORDER — FENTANYL CITRATE (PF) 100 MCG/2ML IJ SOLN
INTRAMUSCULAR | Status: AC
  Administered 2021-11-15: 13:00:00 25 ug via INTRAVENOUS
  Filled 2021-11-15: qty 2

## 2021-11-15 MED ORDER — DONEPEZIL HCL 5 MG PO TABS: 5.0000 mg | ORAL_TABLET | Freq: Every day | ORAL | Status: DC

## 2021-11-15 MED ORDER — MELATONIN 5 MG PO TABS
5.0000 mg | ORAL_TABLET | Freq: Every day | ORAL | Status: DC
  Administered 2021-11-15: 23:00:00 5 mg via ORAL
  Filled 2021-11-15: qty 1

## 2021-11-15 MED ORDER — ONDANSETRON HCL 4 MG PO TABS
4.0000 mg | ORAL_TABLET | Freq: Four times a day (QID) | ORAL | Status: DC | PRN
  Administered 2021-11-16: 4 mg via ORAL
  Filled 2021-11-15: qty 1

## 2021-11-15 MED ORDER — PHENYLEPHRINE 40 MCG/ML (10ML) SYRINGE FOR IV PUSH (FOR BLOOD PRESSURE SUPPORT)
PREFILLED_SYRINGE | INTRAVENOUS | Status: DC | PRN
  Administered 2021-11-15 (×3): 80 ug via INTRAVENOUS

## 2021-11-15 MED ORDER — PANTOPRAZOLE SODIUM 40 MG PO TBEC
40.0000 mg | DELAYED_RELEASE_TABLET | Freq: Two times a day (BID) | ORAL | Status: DC
  Administered 2021-11-15 – 2021-11-16 (×2): 40 mg via ORAL
  Filled 2021-11-15: qty 1

## 2021-11-15 MED ORDER — PROPOFOL 10 MG/ML IV BOLUS
INTRAVENOUS | Status: DC | PRN
  Administered 2021-11-15: 50 mg via INTRAVENOUS
  Administered 2021-11-15: 100 mg via INTRAVENOUS
  Administered 2021-11-15: 50 mg via INTRAVENOUS

## 2021-11-15 MED ORDER — 0.9 % SODIUM CHLORIDE (POUR BTL) OPTIME
TOPICAL | Status: DC | PRN
  Administered 2021-11-15: 13:00:00 300 mL

## 2021-11-15 MED ORDER — ONDANSETRON HCL 4 MG/2ML IJ SOLN: 4.0000 mg | Freq: Four times a day (QID) | INTRAMUSCULAR | Status: DC | PRN

## 2021-11-15 MED ORDER — ATORVASTATIN CALCIUM 10 MG PO TABS
10.0000 mg | ORAL_TABLET | Freq: Every day | ORAL | Status: DC
  Administered 2021-11-15 – 2021-11-16 (×2): 10 mg via ORAL
  Filled 2021-11-15 (×2): qty 1

## 2021-11-15 MED ORDER — MEMANTINE HCL 5 MG PO TABS
10.0000 mg | ORAL_TABLET | Freq: Two times a day (BID) | ORAL | Status: DC
  Administered 2021-11-15 – 2021-11-16 (×2): 10 mg via ORAL
  Filled 2021-11-15 (×2): qty 2

## 2021-11-15 MED ORDER — ACETAMINOPHEN 10 MG/ML IV SOLN: 1000.0000 mg | Freq: Once | INTRAVENOUS | Status: DC | PRN

## 2021-11-15 MED ORDER — GLYCOPYRROLATE 0.2 MG/ML IJ SOLN
INTRAMUSCULAR | Status: DC | PRN
  Administered 2021-11-15: .2 mg via INTRAVENOUS

## 2021-11-15 SURGICAL SUPPLY — 38 items
APPLICATOR COTTON TIP 6 STRL (MISCELLANEOUS) IMPLANT
APPLICATOR COTTON TIP 6IN STRL (MISCELLANEOUS) ×10 IMPLANT
BAG INFUSER PRESSURE 100CC (MISCELLANEOUS) ×1 IMPLANT
BNDG GAUZE ELAST 4 BULKY (GAUZE/BANDAGES/DRESSINGS) ×1 IMPLANT
CATH FOLEY 2WAY  5CC 16FR (CATHETERS) ×2
CATH URTH 16FR FL 2W BLN LF (CATHETERS) IMPLANT
CNTNR SPEC 2.5X3XGRAD LEK (MISCELLANEOUS) ×1
CONT SPEC 4OZ STER OR WHT (MISCELLANEOUS) ×1
CONT SPEC 4OZ STRL OR WHT (MISCELLANEOUS) ×1
CONTAINER SPEC 2.5X3XGRAD LEK (MISCELLANEOUS) IMPLANT
DEVICE MYOSURE LITE (MISCELLANEOUS) IMPLANT
DEVICE MYOSURE REACH (MISCELLANEOUS) IMPLANT
DRSG TELFA 3X8 NADH (GAUZE/BANDAGES/DRESSINGS) ×2 IMPLANT
GAUZE 4X4 16PLY ~~LOC~~+RFID DBL (SPONGE) ×2 IMPLANT
GLOVE SURG SYN 8.0 (GLOVE) ×2 IMPLANT
GLOVE SURG SYN 8.0 PF PI (GLOVE) ×1 IMPLANT
GOWN STRL REUS W/ TWL LRG LVL3 (GOWN DISPOSABLE) ×1 IMPLANT
GOWN STRL REUS W/ TWL XL LVL3 (GOWN DISPOSABLE) ×1 IMPLANT
GOWN STRL REUS W/TWL LRG LVL3 (GOWN DISPOSABLE) ×2
GOWN STRL REUS W/TWL XL LVL3 (GOWN DISPOSABLE) ×2
IV NS 1000ML (IV SOLUTION)
IV NS 1000ML BAXH (IV SOLUTION) ×1 IMPLANT
IV NS IRRIG 3000ML ARTHROMATIC (IV SOLUTION) ×1 IMPLANT
KIT PROCEDURE FLUENT (KITS) IMPLANT
KIT TURNOVER CYSTO (KITS) ×2 IMPLANT
MANIFOLD NEPTUNE II (INSTRUMENTS) ×2 IMPLANT
NS IRRIG 500ML POUR BTL (IV SOLUTION) ×1 IMPLANT
PACK DNC HYST (MISCELLANEOUS) IMPLANT
PAD DRESSING TELFA 3X8 NADH (GAUZE/BANDAGES/DRESSINGS) IMPLANT
PAD OB MATERNITY 4.3X12.25 (PERSONAL CARE ITEMS) ×1 IMPLANT
PAD PREP 24X41 OB/GYN DISP (PERSONAL CARE ITEMS) ×2 IMPLANT
SCRUB EXIDINE 4% CHG 4OZ (MISCELLANEOUS) ×3 IMPLANT
SEAL ROD LENS SCOPE MYOSURE (ABLATOR) ×2 IMPLANT
SET CYSTO W/LG BORE CLAMP LF (SET/KITS/TRAYS/PACK) IMPLANT
SYR 10ML LL (SYRINGE) ×1 IMPLANT
TOWEL OR 17X26 4PK STRL BLUE (TOWEL DISPOSABLE) ×2 IMPLANT
TUBING CONNECTING 10 (TUBING) IMPLANT
WATER STERILE IRR 500ML POUR (IV SOLUTION) ×1 IMPLANT
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
## 2021-11-15 NOTE — Transfer of Care (Signed)
Immediate Anesthesia Transfer of Care Note  Patient: Wendie Agreste  Procedure(s) Performed: DILATATION AND CURETTAGE, CERVICAL BIOPSY  Patient Location: PACU  Anesthesia Type:General  Level of Consciousness: awake, alert  and oriented  Airway & Oxygen Therapy: Patient Spontanous Breathing and Patient connected to face mask oxygen  Post-op Assessment: Report given to RN and Post -op Vital signs reviewed and stable  Post vital signs: Reviewed and stable  Last Vitals:  Vitals Value Taken Time  BP 94/56 11/15/21 1300  Temp 36.8 C 11/15/21 1256  Pulse 80 11/15/21 1304  Resp 20 11/15/21 1304  SpO2 100 % 11/15/21 1304  Vitals shown include unvalidated device data.  Last Pain:  Vitals:   11/15/21 1300  TempSrc:   PainSc: Asleep      Patients Stated Pain Goal: 0 (66/06/00 4599)  Complications: No notable events documented.

## 2021-11-15 NOTE — Anesthesia Procedure Notes (Signed)
Procedure Name: LMA Insertion Date/Time: 11/15/2021 12:13 PM Performed by: Nelda Marseille, CRNA Pre-anesthesia Checklist: Patient identified, Patient being monitored, Timeout performed, Emergency Drugs available and Suction available Patient Re-evaluated:Patient Re-evaluated prior to induction Oxygen Delivery Method: Circle system utilized Preoxygenation: Pre-oxygenation with 100% oxygen Induction Type: IV induction Ventilation: Mask ventilation without difficulty LMA: LMA inserted LMA Size: 4.0 Tube type: Oral Number of attempts: 1 Placement Confirmation: positive ETCO2 and breath sounds checked- equal and bilateral Tube secured with: Tape Dental Injury: Teeth and Oropharynx as per pre-operative assessment

## 2021-11-15 NOTE — Anesthesia Postprocedure Evaluation (Signed)
Anesthesia Post Note  Patient: Laura Foster  Procedure(s) Performed: DILATATION AND CURETTAGE, CERVICAL BIOPSY  Patient location during evaluation: PACU Anesthesia Type: General Level of consciousness: awake and alert Pain management: pain level controlled Vital Signs Assessment: post-procedure vital signs reviewed and stable Respiratory status: spontaneous breathing, nonlabored ventilation and respiratory function stable Cardiovascular status: blood pressure returned to baseline and stable Postop Assessment: no apparent nausea or vomiting Anesthetic complications: no   No notable events documented.   Last Vitals:  Vitals:   11/15/21 1400 11/15/21 1415  BP: 95/60   Pulse: 73 79  Resp: 18 20  Temp:  (!) 36.1 C  SpO2: 96% 97%    Last Pain:  Vitals:   11/15/21 1415  TempSrc:   PainSc: [place]

## 2021-11-15 NOTE — ED Provider Notes (Signed)
75 year old female who was signed out to me pending a transvaginal ultrasound and admission.  She presented with vaginal bleeding earlier today and was ultimately returns with heavier bleeding.  Her H&H is stable.  On exam she did have significant clots and bleeding which after evacuation was not ongoing.  She her blood pressure is borderline low but she is otherwise hemodynamically stable.  Dr. Charna Archer spoke with OB/GYN who recommended oral TXA.  Plan is for admission given this is her second visit for bleeding.  Vaginal ultrasound demonstrates a thickened endometrium potential cervical mass.  Will admit to the hospital service.  There is a repeat H&H pending.   Rada Hay, MD 11/15/21 [phone]

## 2021-11-15 NOTE — TOC Initial Note (Signed)
Transition of Care [facility]) - Initial/Assessment Note    Patient Details  Name: Laura Foster MRN: [id number] Date of Birth: 02-07-1946  Transition of Care [facility]) CM/SW Contact:    Beverly Sessions, RN Phone Number: 11/15/2021, 10:51 AM  Clinical Narrative:                  Admitted YOV:ZCHYIFO bleeding Admitted from:[facility]. Daughter confirms plans is to return at discharge.  Spoke with Benjamine Mola at [facility].  She confirms that patient can return unless she is significantly debilitated`  She requested PT eval and palliative consult.  MD notified  Current home health/prior home health/DME:Per daughter and facility patient ambulates with RW for stability   Expected Discharge Plan: Memory Care Barriers to Discharge: Continued Medical Work up   Patient Goals and CMS Choice        Expected Discharge Plan and Services Expected Discharge Plan: Memory Care       Living arrangements for the past 2 months: [place]                                      Prior Living Arrangements/Services Living arrangements for the past 2 months: [place] Lives with:: Facility Resident Patient language and need for interpreter reviewed:: Yes Do you feel safe going back to the place where you live?: Yes      Need for Family Participation in Patient Care: Yes (Comment) Care giver support system in place?: Yes (comment) Current home services: DME Criminal Activity/Legal Involvement Pertinent to Current Situation/Hospitalization: No - Comment as needed  Activities of Daily Living Home Assistive Devices/Equipment: None ADL Screening (condition at time of admission) Patient's cognitive ability adequate to safely complete daily activities?: Yes Is the patient deaf or have difficulty hearing?: No Does the patient have difficulty seeing, even when wearing glasses/contacts?: No Does the patient have difficulty concentrating, remembering, or making  decisions?: No Patient able to express need for assistance with ADLs?: Yes Does the patient have difficulty dressing or bathing?: No Independently performs ADLs?: Yes (appropriate for developmental age) Does the patient have difficulty walking or climbing stairs?: Yes Weakness of Legs: Both Weakness of Arms/Hands: None  Permission Sought/Granted                  Emotional Assessment       Orientation: : Oriented to Self, Oriented to Place Alcohol / Substance Use: Not Applicable Psych Involvement: No (comment)  Admission diagnosis:  Vagina bleeding [N93.9] Vaginal bleeding [N93.9] Patient Active Problem List   Diagnosis Date Noted   History of GI bleed 11/15/2021   Vaginal bleeding 11/15/2021   Hyponatremia 06/03/2021   Hypoalbuminemia 06/03/2021   Macrocytic anemia 06/03/2021   Thrombocytosis 06/03/2021   Dementia ([place]) 06/03/2021   Hyperlipidemia 06/03/2021   Anxiety 06/03/2021   Acute blood loss anemia    Duodenal ulcer    Acute GI bleeding 05/21/2021   Acute respiratory failure with hypoxia ([place]) 05/18/2021   COPD with acute exacerbation ([place]) 05/18/2021   Closed right hip fracture, with routine healing, subsequent encounter 05/15/2021   Prolonged QT interval 05/15/2021   COPD (chronic obstructive pulmonary disease) ([place]) 05/15/2021   Depression 05/15/2021   Memory disorder 04/24/2018   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 [place] [address] Phone: [phone] Fax: [place], [place] [address] Phone: [phone] Fax: [phone]     Social Determinants of Health (SDOH) Interventions    Readmission Risk Interventions Readmission Risk Prevention Plan 11/15/2021  Transportation Screening Complete   Social Work Consult for [place] Planning/Counseling Complete  Palliative Care Screening Complete  Medication Review Press photographer) Complete  Some recent data might be hidden

## 2021-11-15 NOTE — Anesthesia Preprocedure Evaluation (Addendum)
Anesthesia Evaluation  Patient identified by MRN, date of birth, ID band Patient awake    Reviewed: Allergy & Precautions, NPO status , Patient's Chart, lab work & pertinent test results  History of Anesthesia Complications Negative for: history of anesthetic complications  Airway Mallampati: II  TM Distance: >3 FB Neck ROM: Full    Dental  (+) Edentulous Upper, Edentulous Lower   Pulmonary shortness of breath and with exertion, COPD,  COPD inhaler, Current Smoker and Patient abstained from smoking., former smoker,    Pulmonary exam normal  (-) wheezing      Cardiovascular Exercise Tolerance: Good Normal cardiovascular exam+ dysrhythmias (Prolonged QT)  Rhythm:Regular Rate:Normal  EKG 12/22: Prolonged QTc   Neuro/Psych PSYCHIATRIC DISORDERS Anxiety Depression Dementia    GI/Hepatic Neg liver ROS, PUD, neg GERD  ,  Endo/Other  Hypothyroidism   Renal/GU Renal InsufficiencyRenal disease     Musculoskeletal negative musculoskeletal ROS (+)   Abdominal   Peds  Hematology  (+) anemia ,   Anesthesia Other Findings Vaginal bleeding which is likely cervical cancer. Pt received 1 unit pRBC this morning. Hbg 7.3 this morning. Pt has a history of anemia.  Reproductive/Obstetrics negative OB ROS                          Anesthesia Physical  Anesthesia Plan  ASA: 3  Anesthesia Plan: General   Post-op Pain Management:    Induction: Intravenous  PONV Risk Score and Plan: Ondansetron, Dexamethasone and Treatment may vary due to age or medical condition  Airway Management Planned: LMA  Additional Equipment:   Intra-op Plan:   Post-operative Plan: Extubation in OR  Informed Consent: I have reviewed the patients History and Physical, chart, labs and discussed the procedure including the risks, benefits and alternatives for the proposed anesthesia with the patient or authorized representative who  has indicated his/her understanding and acceptance.     Dental advisory given  Plan Discussed with: CRNA and Anesthesiologist  Anesthesia Plan Comments:       Anesthesia Quick Evaluation
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
## 2021-11-15 NOTE — Plan of Care (Signed)
PMT note:  Consult request noted. Patient is currently off floor at this time and in OR. Will reattempt GOC at another time.

## 2021-11-15 NOTE — Progress Notes (Signed)
[place] at [place] NAME: Laura Foster    MR#:  [id number]  DATE OF BIRTH:  1946/06/19  SUBJECTIVE:  patient got back from cervical biopsy. Packing has been done. No vaginally bleeding reported by RN. No family at bedside.  REVIEW OF SYSTEMS:   Review of Systems  Constitutional:  Negative for chills, fever and weight loss.  HENT:  Negative for ear discharge, ear pain and nosebleeds.   Eyes:  Negative for blurred vision, pain and discharge.  Respiratory:  Negative for sputum production, shortness of breath, wheezing and stridor.   Cardiovascular:  Negative for chest pain, palpitations, orthopnea and PND.  Gastrointestinal:  Negative for abdominal pain, diarrhea, nausea and vomiting.  Genitourinary:  Negative for frequency and urgency.  Musculoskeletal:  Negative for back pain and joint pain.  Neurological:  Negative for sensory change, speech change, focal weakness and weakness.  Psychiatric/Behavioral:  Negative for depression and hallucinations. The patient is not nervous/anxious.   Tolerating Diet:yes Tolerating PT:   DRUG ALLERGIES:   Allergies  Allergen Reactions   Penicillins Rash    VITALS:  Blood pressure (!) 93/55, pulse 64, temperature 98.3 F (36.8 C), temperature source Oral, resp. rate 18, height 5\' 3"  (1.6 m), weight 78.5 kg, SpO2 100 %.  PHYSICAL EXAMINATION:   Physical Exam  GENERAL:  75 y.o.-year-old patient lying in the bed with no acute distress.  HEENT: Head atraumatic, normocephalic. Oropharynx and nasopharynx clear.  LUNGS: Normal breath sounds bilaterally, no wheezing, rales, rhonchi. No use of accessory muscles of respiration.  CARDIOVASCULAR: S1, S2 normal. No murmurs, rubs, or gallops.  ABDOMEN: Soft, nontender, nondistended. Bowel sounds present. No organomegaly or mass.  EXTREMITIES: No cyanosis, clubbing or edema b/l.    NEUROLOGIC: nonfocal PSYCHIATRIC:  patient is alert and awake SKIN: No  obvious rash, lesion, or ulcer.   LABORATORY PANEL:  CBC Recent Labs  Lab 11/15/21 1513  WBC 9.5  HGB 8.8*  HCT 26.8*  PLT 353    Chemistries  Recent Labs  Lab 11/14/21 2143 11/15/21 1137  NA 137 142  K 3.7 4.0  CL 103 102  CO2 29  --   GLUCOSE 143* 94  BUN 16 14  CREATININE 1.00 0.80  CALCIUM 8.4*  --   MG 2.3  --   AST 15  --   ALT 10  --   ALKPHOS 74  --   BILITOT 0.5  --    Cardiac Enzymes No results for input(s): TROPONINI in the last 168 hours. RADIOLOGY:  US PELVIC COMPLETE WITH TRANSVAGINAL  Result Date: 11/15/2021 CLINICAL DATA:  Postmenopausal bleeding x2 days EXAM: TRANSABDOMINAL AND TRANSVAGINAL ULTRASOUND OF PELVIS TECHNIQUE: Both transabdominal and transvaginal ultrasound examinations of the pelvis were performed. Transabdominal technique was performed for global imaging of the pelvis including uterus, ovaries, adnexal regions, and pelvic cul-de-sac. It was necessary to proceed with endovaginal exam following the transabdominal exam to visualize the endometrium. COMPARISON:  None FINDINGS: Uterus Measurements: 8.1 x 4.2 x 4.2 cm = volume: 75 mL. Possible 2.3 x 2.7 x 2.0 cm cervical mass with vascularity, equivocal. Endometrium Thickness: 7 mm.  No focal abnormality visualized. Right ovary Measurements: 6.3 x 4.0 x 5.2 cm = volume: 68 mL. 3.5 x 3.8 x 4.2 cm simple cyst. Left ovary Not discretely visualized.  No adnexal mass is seen. Other findings No abnormal free fluid. IMPRESSION: Endometrial complex measures 7 mm. In the setting of post-menopausal bleeding, endometrial sampling is indicated  to exclude carcinoma. If results are benign, sonohysterogram should be considered for focal lesion work-up. (Ref: Radiological Reasoning: Algorithmic Workup of Abnormal Vaginal Bleeding with Endovaginal Sonography and Sonohysterography. AJR 2008; 825:O03-70) Possible 2.7 cm cervical mass, equivocal. Visual inspection is suggested. 4.2 cm simple ovarian cyst. Recommend  follow-up pelvic US in 3-6 months. Reference: Radiology 2019 Nov;293(2):359-371 Electronically Signed   By: Laura Foster M.D.   On: 11/15/2021 00:10   ASSESSMENT AND PLAN:   Laura Foster is a 75 y.o. female with medical history significant for Hypothyroidism, dementia, GI bleed, prolonged QT, COPD, who presents to the emergency room for the second time from nursing facility with vaginal bleeding.  On her first visit she was noted to have light vaginal bleeding and was hemodynamically stable and was subsequently treated and discharge.  On the return visit she had much heavier bleeding with clots.  History limited due to dementia.  OB/GYN Dr. Ouida Foster was contacted who recommended oral TXA   acute blood loss anemia secondary to vaginally bleeding -- patient is status post one unit blood transfusion -- hemoglobin 8.8 -- patient was seen by GYN Dr. Ouida Foster underwent cervical biopsy is today. -- Per GYN worrisome for cervical cancer -- ultrasound consistent with 2.7 cm cervical mass  COPD -- stable -- continue inhalers  Hypothyroidism -- continue Synthroid  Dementia -- continue memantine  TOC for discharge planning to [facility] memory unit. Per TOC her facility wants palliative care to see patient and PT  Procedures: cervical biopsy Family communication :none today Consults :GYN CODE STATUS: FULL DVT Prophylaxis : Level of care: Telemetry Medical Status is: Inpatient  Remains inpatient appropriate because: vaginal bleeding        TOTAL TIME TAKING CARE OF THIS PATIENT: 25 minutes.  >50% time spent on counselling and coordination of care  Note: This dictation was prepared with Dragon dictation along with smaller phrase technology. Any transcriptional errors that result from this process are unintentional.  Laura Foster M.D    [facility]   CC: Primary care physician; Laura Squibb, MD Patient ID: Laura Foster, female   DOB: 22-Sep-1946, 75 y.o.    MRN: [id number]

## 2021-11-15 NOTE — Brief Op Note (Signed)
11/14/2021 - 11/15/2021  12:41 PM  PATIENT:  Laura Foster  75 y.o. female  PRE-OPERATIVE DIAGNOSIS:  vaginal bleeding  Cervical cancer  POST-OPERATIVE DIAGNOSIS:  same as above  PROCEDURE:  Cervical biopsies  EUA SURGEON:  Surgeon(s) and Role:    * Fredonia Casalino, Gwen Her, MD - Primary    * [place], [place], MD - Assisting  PHYSICIAN ASSISTANT:   ASSISTANTS: none   ANESTHESIA:   general  EBL:  25 mL IOF 500 cc UO :150 cc  BLOOD ADMINISTERED:none  DRAINS: Urinary Catheter (Foley)   LOCAL MEDICATIONS USED:  NONE  SPECIMEN:  Source of Specimen:  cervical biopsies , one frozen section   DISPOSITION OF SPECIMEN:  PATHOLOGY  COUNTS:  YES  TOURNIQUET:  * No tourniquets in log *  DICTATION: .Other Dictation: Dictation Number verbal  PLAN OF CARE:  remain as inpt  PATIENT DISPOSITION:  PACU - hemodynamically stable.   Delay start of Pharmacological VTE agent (>24hrs) due to surgical blood loss or risk of bleeding: not applicable

## 2021-11-15 NOTE — Telephone Encounter (Signed)
PET, medical oncology and radiation oncology consults ordered per Dr. Theora Gianotti. Asked Dr. Baruch Gouty to see inpatient. PET Per [facility] Auth# [id number] valid 11/15/21-12/30/21 for [id number].
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
## 2021-11-16 ENCOUNTER — Ambulatory Visit: Payer: Medicare Other | Admitting: Radiation Oncology

## 2021-11-16 ENCOUNTER — Inpatient Hospital Stay: Payer: Medicare Other

## 2021-11-16 ENCOUNTER — Ambulatory Visit: Payer: Medicare Other

## 2021-11-16 ENCOUNTER — Encounter: Payer: Self-pay | Admitting: Obstetrics and Gynecology

## 2021-11-16 DIAGNOSIS — C539 Malignant neoplasm of cervix uteri, unspecified: Secondary | ICD-10-CM | POA: Insufficient documentation

## 2021-11-16 DIAGNOSIS — Z510 Encounter for antineoplastic radiation therapy: Secondary | ICD-10-CM | POA: Insufficient documentation

## 2021-11-16 LAB — HEMOGLOBIN: Hemoglobin: 7.7 g/dL — ABNORMAL LOW (ref 12.0–15.0)

## 2021-11-16 LAB — HEMOGLOBIN AND HEMATOCRIT, BLOOD
HCT: 27.4 % — ABNORMAL LOW (ref 36.0–46.0)
Hemoglobin: 9.2 g/dL — ABNORMAL LOW (ref 12.0–15.0)

## 2021-11-16 LAB — PREPARE RBC (CROSSMATCH)

## 2021-11-16 LAB — HIV ANTIBODY (ROUTINE TESTING W REFLEX): HIV Screen 4th Generation wRfx: NONREACTIVE

## 2021-11-16 LAB — SURGICAL PATHOLOGY

## 2021-11-16 MED ORDER — ACETAMINOPHEN 500 MG PO TABS
500.0000 mg | ORAL_TABLET | Freq: Three times a day (TID) | ORAL | 0 refills | Status: DC | PRN
Start: 2021-11-16 — End: 2021-12-01

## 2021-11-16 MED ORDER — IOHEXOL 9 MG/ML PO SOLN
500.0000 mL | ORAL | Status: AC
  Administered 2021-11-16 (×2): 500 mL via ORAL

## 2021-11-16 MED ORDER — SODIUM CHLORIDE 0.9% IV SOLUTION: Freq: Once | INTRAVENOUS | Status: AC

## 2021-11-16 MED ORDER — IOHEXOL 300 MG/ML  SOLN
100.0000 mL | Freq: Once | INTRAMUSCULAR | Status: AC | PRN
  Administered 2021-11-16: 100 mL via INTRAVENOUS

## 2021-11-16 NOTE — Progress Notes (Signed)
Patient vaginal packing removed by MD gyn this am.  No bleeding noted with wiping after using restroom.  Foley removed.

## 2021-11-16 NOTE — Discharge Summary (Signed)
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 female with medical history significant for Hypothyroidism, dementia, GI bleed, prolonged QT, COPD, who presents to the emergency room for the second time from nursing facility with vaginal bleeding.  On her first visit she was noted to have light vaginal bleeding and was hemodynamically stable and was subsequently treated and discharge.  On the return visit she had much heavier bleeding with clots.  History limited due to dementia.  OB/GYN Dr. Ouida Sills was contacted who recommended oral TXA    acute blood loss anemia secondary to vaginally bleeding -- patient is status post one unit blood transfusion -- hemoglobin 8.8 -- patient was seen by GYN Dr. Ouida Sills underwent cervical biopsy is today. 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 Patient will follow-up with Dr. Theora Gianotti GYN oncology as outpatient -- patient did get CT scan abdomen pelvis today. PET scan will be done as outpatient per GYN. -- She'll get another unit of  blood transfusion prior to discharge. Hemoglobin today 7.7. -- Foley will be removed prior to discharge per GYN.   COPD -- stable -- continue inhalers   Hypothyroidism -- continue Synthroid   Dementia -- continue memantine   TOC for discharge planning to [facility] memory unit.   Seen by physical therapy. No PT needs. Procedures: cervical biopsy Family communication : sister at bedside and daughter DNI Arnoldo Morale on the phone. Consults :GYN CODE STATUS: FULL DVT Prophylaxis : L Status is: Inpatient   patient will discharged today back to [facility] memory care after blood transfusion  CONSULTS OBTAINED:    DRUG ALLERGIES:   Allergies  Allergen Reactions   Penicillins Rash    DISCHARGE MEDICATIONS:   Allergies as of 11/16/2021       Reactions   Penicillins Rash        Medication List     STOP taking these medications    citalopram 10 MG tablet Commonly known as: CELEXA   donepezil 5 MG tablet Commonly known as: ARICEPT       TAKE these medications    acetaminophen 500 MG tablet Commonly known as: TYLENOL Take 1 tablet (500 mg total) by mouth every 8 (eight) hours as needed (pain or fever >100). Do not exceed 3000mg  in 24 hours What changed: how much to take   albuterol 108 (90 Base) MCG/ACT inhaler Commonly known as: VENTOLIN HFA Inhale 2 puffs into the lungs every 6 (six) hours as needed for wheezing or shortness of breath.   ALPRAZolam 0.5 MG tablet Commonly known as: XANAX Take 1  tablet (0.5 mg total) by mouth every morning.   atorvastatin 10 MG tablet Commonly known as: LIPITOR Take 10 mg by mouth daily with supper.   ipratropium-albuterol 0.5-2.5 (3) MG/3ML Soln Commonly known as: DUONEB Take 3 mLs by nebulization every 4 (four) hours as needed (COPD).   levothyroxine 75 MCG tablet Commonly known as: SYNTHROID Take 1 tablet (75 mcg total) by mouth daily before breakfast.   melatonin 5 MG Tabs Take 5 mg by mouth at bedtime.    memantine 10 MG tablet Commonly known as: NAMENDA Take 10 mg by mouth 2 (two) times daily.   multivitamin with minerals tablet Take 1 tablet by mouth daily.   pantoprazole 40 MG tablet Commonly known as: Protonix Take 1 tablet (40 mg total) by mouth 2 (two) times daily.   Preparation H 0.25-14-74.9 % rectal ointment Generic drug: phenylephrine-shark liver oil-mineral oil-petrolatum Place 1 application rectally See admin instructions. Apply topically and sparingly 4 times a day as needed.   Spiriva Respimat 2.5 MCG/ACT Aers Generic drug: Tiotropium Bromide Monohydrate Inhale 2 puffs into the lungs daily.   umeclidinium-vilanterol 62.5-25 MCG/INH Aepb Commonly known as: ANORO ELLIPTA Inhale 1 puff into the lungs at bedtime.   Vitamin D3 25 MCG (1000 UT) Caps Take 1,000 Units by mouth daily.        If you experience worsening of your admission symptoms, develop shortness of breath, life threatening emergency, suicidal or homicidal thoughts you must seek medical attention immediately by calling 911 or calling your MD immediately  if symptoms less severe.  You Must read complete instructions/literature along with all the possible adverse reactions/side effects for all the Medicines you take and that have been prescribed to you. Take any new Medicines after you have completely understood and accept all the possible adverse reactions/side effects.   Please note  You were cared for by a hospitalist during your hospital stay. If you have any questions about your discharge medications or the care you received while you were in the hospital after you are discharged, you can call the unit and asked to speak with the hospitalist on call if the hospitalist that took care of you is not available. Once you are discharged, your primary care physician will handle any further medical issues. Please note that NO REFILLS for any discharge medications will be authorized once you are discharged, as it  is imperative that you return to your primary care physician (or establish a relationship with a primary care physician if you do not have one) for your aftercare needs so that they can reassess your need for medications and monitor your lab values. Today   SUBJECTIVE  no new complaints. Sister at bedside. Patient denies abdominal pain. No vegetal bleeding reported   VITAL SIGNS:  Blood pressure 103/73, pulse 70, temperature 98 F (36.7 C), temperature source Oral, resp. rate 18, height 5\' 3"  (1.6 m), weight 78.5 kg, SpO2 100 %.  I/O:   Intake/Output Summary (Last 24 hours) at 11/16/2021 1252 Last data filed at 11/16/2021 1000 Gross per 24 hour  Intake 630 ml  Output 800 ml  Net -170 ml    PHYSICAL EXAMINATION:  GENERAL:  75 y.o.-year-old patient lying in the bed with no acute distress.  LUNGS: Normal breath sounds bilaterally, no wheezing, rales,rhonchi or crepitation. No use of accessory muscles of respiration.  CARDIOVASCULAR: S1, S2 normal. No murmurs, rubs, or gallops.  ABDOMEN: Soft, non-tender, non-distended. Bowel sounds present. No organomegaly or mass.  EXTREMITIES: No pedal edema,  cyanosis, or clubbing.  NEUROLOGIC: non-focal PSYCHIATRIC:  patient is alert and oriented x 3.  SKIN: No obvious rash, lesion, or ulcer.   DATA REVIEW:   CBC  Recent Labs  Lab 11/15/21 1513 11/16/21 1150  WBC 9.5  --   HGB 8.8* 7.7*  HCT 26.8*  --   PLT 353  --     Chemistries  Recent Labs  Lab 11/14/21 2143 11/15/21 1137  NA 137 142  K 3.7 4.0  CL 103 102  CO2 29  --   GLUCOSE 143* 94  BUN 16 14  CREATININE 1.00 0.80  CALCIUM 8.4*  --   MG 2.3  --   AST 15  --   ALT 10  --   ALKPHOS 74  --   BILITOT 0.5  --     Microbiology Results   Recent Results (from the past 240 hour(s))  Resp Panel by RT-PCR (Flu A&B, Covid) Nasopharyngeal Swab     Status: None   Collection Time: 11/14/21  9:14 PM   Specimen: Nasopharyngeal Swab; Nasopharyngeal(NP) swabs in vial  transport medium  Result Value Ref Range Status   SARS Coronavirus 2 by RT PCR NEGATIVE NEGATIVE Final    Comment: (NOTE) SARS-CoV-2 target nucleic acids are NOT DETECTED.  The SARS-CoV-2 RNA is generally detectable in upper respiratory specimens during the acute phase of infection. The lowest concentration of SARS-CoV-2 viral copies this assay can detect is 138 copies/mL. A negative result does not preclude SARS-Cov-2 infection and should not be used as the sole basis for treatment or other patient management decisions. A negative result may occur with  improper specimen collection/handling, submission of specimen other than nasopharyngeal swab, presence of viral mutation(s) within the areas targeted by this assay, and inadequate number of viral copies(<138 copies/mL). A negative result must be combined with clinical observations, patient history, and epidemiological information. The expected result is Negative.  Fact Sheet for Patients:  EntrepreneurPulse.com.au  Fact Sheet for Healthcare Providers:  IncredibleEmployment.be  This test is no t yet approved or cleared by the [place] FDA and  has been authorized for detection and/or diagnosis of SARS-CoV-2 by FDA under an Emergency Use Authorization (EUA). This EUA will remain  in effect (meaning this test can be used) for the duration of the COVID-19 declaration under Section 564(b)(1) of the Act, 21 U.S.C.section 360bbb-3(b)(1), unless the authorization is terminated  or revoked sooner.       Influenza A by PCR NEGATIVE NEGATIVE Final   Influenza B by PCR NEGATIVE NEGATIVE Final    Comment: (NOTE) The Xpert Xpress SARS-CoV-2/FLU/RSV plus assay is intended as an aid in the diagnosis of influenza from Nasopharyngeal swab specimens and should not be used as a sole basis for treatment. Nasal washings and aspirates are unacceptable for Xpert Xpress SARS-CoV-2/FLU/RSV testing.  Fact  Sheet for Patients: EntrepreneurPulse.com.au  Fact Sheet for Healthcare Providers: IncredibleEmployment.be  This test is not yet approved or cleared by the [place] FDA and has been authorized for detection and/or diagnosis of SARS-CoV-2 by FDA under an Emergency Use Authorization (EUA). This EUA will remain in effect (meaning this test can be used) for the duration of the COVID-19 declaration under Section 564(b)(1) of the Act, 21 U.S.C. section 360bbb-3(b)(1), unless the authorization is terminated or revoked.  Performed at [facility], [address] [place], [place] [address]     RADIOLOGY:  CT CHEST ABDOMEN PELVIS W CONTRAST  Result Date: 11/16/2021 CLINICAL DATA:  75 year old female  with history of uterine/cervical cancer. Staging examination. Vaginal bleeding. EXAM: CT CHEST, ABDOMEN, AND PELVIS WITH CONTRAST TECHNIQUE: Multidetector CT imaging of the chest, abdomen and pelvis was performed following the standard protocol during bolus administration of intravenous contrast. CONTRAST:  167mL OMNIPAQUE IOHEXOL 300 MG/ML  SOLN COMPARISON:  No prior.  Pelvic ultrasound 11/15/2021. FINDINGS: CT CHEST FINDINGS Cardiovascular: Heart size is normal. There is no significant pericardial fluid, thickening or pericardial calcification. There is aortic atherosclerosis, as well as atherosclerosis of the great vessels of the mediastinum and the coronary arteries, including calcified atherosclerotic plaque in the left main, left anterior descending, left circumflex and right coronary arteries. There is also severe narrowing of the ostial and proximal portions of the left subclavian artery. Calcifications of the aortic valve. Calcifications of the mitral annulus. Lipomatous hypertrophy of the interatrial septum (normal anatomical variant) incidentally noted. Mediastinum/Nodes: No pathologically enlarged mediastinal or hilar lymph nodes. Esophagus is  unremarkable in appearance. No axillary lymphadenopathy. Lungs/Pleura: Numerous tiny 2-4 mm pulmonary nodules scattered throughout the lungs bilaterally. These are nonspecific. No larger more suspicious appearing pulmonary nodules or masses are noted. Scattered areas of linear scarring are noted throughout the lungs bilaterally. No acute consolidative airspace disease. No pleural effusions. Mild diffuse bronchial wall thickening with mild centrilobular and paraseptal emphysema. Musculoskeletal: There are no aggressive appearing lytic or blastic lesions noted in the visualized portions of the skeleton. CT ABDOMEN PELVIS FINDINGS Hepatobiliary: Several scattered small low-attenuation lesions are noted throughout the liver, many of which are too small to definitively characterize, but favored to represent tiny cysts. The largest of these low-attenuation lesions are compatible with simple cysts, measuring up to 1.8 cm in segment 2. In addition, there is an ill-defined hypovascular area in segment 4A/4B of the liver adjacent to the falciform ligament, which is incompletely characterized, but potentially an area of focal fatty infiltration or perfusion anomaly. No other larger more suspicious appearing hepatic lesions are noted. Irregularity of the fundus of the gallbladder could suggest adenomyomatosis. Calcified gallstone lying dependently in the gallbladder measuring 1.8 cm in diameter. No pericholecystic fluid or inflammatory changes to suggest an acute cholecystitis at this time. Pancreas: No pancreatic mass. No pancreatic ductal dilatation. No pancreatic or peripancreatic fluid collections or inflammatory changes. Spleen: Unremarkable. Adrenals/Urinary Tract: Mild bilateral hydroureteronephrosis. The appearance of the kidneys and bilateral adrenal glands is otherwise normal. Foley balloon catheter present within the lumen of the urinary bladder which is decompressed. However, there is some asymmetric mass-like  thickening of the posterior wall of the urinary bladder which is estimated to measure approximately 7.2 x 2.1 x 4.2 cm (axial image 110 of series 2 and sagittal image 96 of series 6). This thickening of the urinary bladder wall encompasses the ureterovesicular junctions bilaterally. Some of this abnormal soft tissue thickening and enhancement appears to extend proximally into the distal third of the left ureter as well best appreciated on axial images 111 and 110 of series 2. Stomach/Bowel: The appearance of the stomach is normal. There is no pathologic dilatation of small bowel or colon. Numerous colonic diverticulae are noted, particularly in the sigmoid colon, without surrounding inflammatory changes to clearly indicate an acute diverticulitis at this time. Normal appendix. Vascular/Lymphatic: Aortic atherosclerosis, without evidence of aneurysm or dissection in the abdominal or pelvic vasculature. Reproductive: Mass-like thickening of the cervix, upper vagina, lower uterine segment and portions of the uterine body best appreciated on axial image 107 of series 2 and sagittal image 96 of series 6 estimated to measure  approximately 3.7 x 7.3 x 4.6 cm, concerning for neoplasm. This is intimately associated with the abnormal soft tissue along the posterior wall of the urinary bladder. Left ovary is unremarkable in appearance. In the right ovary there is a 4.1 x 3.6 x 4.2 cm low-intermediate attenuation lesion (axial image 103 of series 2 and coronal image 91 of series 5), previously characterized as a simple cyst on recent pelvic ultrasound. Other: Small bilateral inguinal hernias containing fat on the right and a short segment of mid to distal small bowel on the left. No significant volume of ascites. No pneumoperitoneum. Musculoskeletal: There are no aggressive appearing lytic or blastic lesions noted in the visualized portions of the skeleton. IMPRESSION: 1. Findings are highly concerning for locally advanced  cervical cancer with potential invasion into the upper vagina, lower uterus and posterior aspect of the urinary bladder. This is associated with mild bilateral hydroureteronephrosis, suggesting involvement of the ureterovesicular junctions bilaterally, as well as the distal third of the left ureter (discussed above). 2. Multiple tiny pulmonary nodules scattered throughout the lungs bilaterally, nonspecific. Metastatic disease is not excluded, and accordingly, close attention on follow-up studies is recommended. 3. Indeterminate lesion in the liver adjacent to the falciform ligament. This is favored to represent a focal area of fatty infiltration or a benign perfusion anomaly, however, further evaluation with nonemergent abdominal MRI with and without IV gadolinium could be considered to provide definitive characterization and exclude metastatic disease. 4. Small bilateral inguinal hernias containing fat on the right and a short segment of mid to distal small bowel on the left (without associated bowel incarceration or obstruction at this time). 5. Colonic diverticulosis without evidence of acute diverticulitis at this time. 6. Cholelithiasis. Irregularity of the fundus of the gallbladder suggestive of adenomyomatosis. 7. Aortic atherosclerosis, in addition to left main and 3 vessel coronary artery disease. Assessment for potential risk factor modification, dietary therapy or pharmacologic therapy may be warranted, if clinically indicated. 8. There are calcifications of the aortic valve and mitral annulus. Echocardiographic correlation for evaluation of potential valvular dysfunction may be warranted if clinically indicated. Electronically Signed   By: Vinnie Langton M.D.   On: 11/16/2021 12:43   US PELVIC COMPLETE WITH TRANSVAGINAL  Result Date: 11/15/2021 CLINICAL DATA:  Postmenopausal bleeding x2 days EXAM: TRANSABDOMINAL AND TRANSVAGINAL ULTRASOUND OF PELVIS TECHNIQUE: Both transabdominal and transvaginal  ultrasound examinations of the pelvis were performed. Transabdominal technique was performed for global imaging of the pelvis including uterus, ovaries, adnexal regions, and pelvic cul-de-sac. It was necessary to proceed with endovaginal exam following the transabdominal exam to visualize the endometrium. COMPARISON:  None FINDINGS: Uterus Measurements: 8.1 x 4.2 x 4.2 cm = volume: 75 mL. Possible 2.3 x 2.7 x 2.0 cm cervical mass with vascularity, equivocal. Endometrium Thickness: 7 mm.  No focal abnormality visualized. Right ovary Measurements: 6.3 x 4.0 x 5.2 cm = volume: 68 mL. 3.5 x 3.8 x 4.2 cm simple cyst. Left ovary Not discretely visualized.  No adnexal mass is seen. Other findings No abnormal free fluid. IMPRESSION: Endometrial complex measures 7 mm. In the setting of post-menopausal bleeding, endometrial sampling is indicated to exclude carcinoma. If results are benign, sonohysterogram should be considered for focal lesion work-up. (Ref: Radiological Reasoning: Algorithmic Workup of Abnormal Vaginal Bleeding with Endovaginal Sonography and Sonohysterography. AJR 2008; 403:K74-25) Possible 2.7 cm cervical mass, equivocal. Visual inspection is suggested. 4.2 cm simple ovarian cyst. Recommend follow-up pelvic US in 3-6 months. Reference: Radiology 2019 Nov;293(2):359-371 Electronically Signed   By:  Julian Hy M.D.   On: 11/15/2021 00:10     CODE STATUS:     Code Status Orders  (From admission, onward)           Start     Ordered   11/15/21 0217  Full code  Continuous        11/15/21 0217           Code Status History     Date Active Date Inactive Code Status Order ID Comments User Context   06/03/2021 0221 06/09/2021 2216 Full Code [id number]  Bernadette Hoit, DO Inpatient   05/21/2021 1826 05/24/2021 2149 Full Code [id number]  Roxan Hockey, MD ED   05/21/2021 1209 05/21/2021 1826 Full Code [id number]  Roxan Hockey, MD ED   05/15/2021 1940 05/18/2021 2300 Full Code [id number]   Bethena Roys, MD Inpatient        TOTAL TIME TAKING CARE OF THIS PATIENT: 40 minutes.    [name] M.D  [facility]    CC: Primary care physician; [name], MD
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
## 2021-11-16 NOTE — Evaluation (Addendum)
Physical Therapy Evaluation Patient Details Name: EMORI MUMME MRN: [id number] DOB: Dec 11, 1945 Today's Date: 11/16/2021  History of Present Illness  Patient is a 75 year old female who presents to the emergency room for the second time from nursing facility with vaginal bleeding. Patient is s/p cervical biposy. Past medical history significant for Hypothyroidism, dementia, GI bleed, prolonged QT, COPD.  Clinical Impression  Patient agreeable to PT evaluation. She is very cooperative and pleasant throughout session. She is from ALF and resides in the memory care unit. At baseline, she is ambulatory with rolling walker.  Patient is currently Mod I for all functional activity. She was able to get out of bed, stand, and walk with the rolling walker without physical assistance. No shortness of breath is noted with activity. Sp02 98% after walking and heart rate 93bpm. Patient and sister declined need for PT services at ALF. The patient appears to be at her baseline level of functional mobility. No anticipated PT needs at this time and recommend to return to ALF.     Recommendations for follow up therapy are one component of a multi-disciplinary discharge planning process, led by the attending physician.  Recommendations may be updated based on patient status, additional functional criteria and insurance authorization.  Follow Up Recommendations No PT follow up    Assistance Recommended at Discharge Set up Supervision/Assistance  Functional Status Assessment Patient has not had a recent decline in their functional status  Equipment Recommendations  None recommended by PT    Recommendations for Other Services       Precautions / Restrictions Precautions Precautions: Fall Precaution Comments: spoke with Dr. Posey Pronto who requested for PT evaluation to see for possible d/c back to facility Restrictions Weight Bearing Restrictions: No      Mobility  Bed Mobility Overal bed mobility: Modified  Independent             General bed mobility comments: no physical assistance required. extra time needed. cues for sequencing and task initiation    Transfers Overall transfer level: Modified independent Equipment used: Rolling walker (2 wheels)               General transfer comment: patient able to stand from bed without physical assistance. no loss of balance and no dizziness is reported with upright activity    Ambulation/Gait Ambulation/Gait assistance: Modified independent (Device/Increase time) Gait Distance (Feet): 45 Feet Assistive device: Rolling walker (2 wheels) Gait Pattern/deviations: Step-through pattern;Trunk flexed Gait velocity: decreased     General Gait Details: patient ambulated without difficulty around room on 3L02 using rolling walker for support. no shortness of breath noted with standing activity, Sp02 98% after walking and heart rate 93bpm  Stairs            Wheelchair Mobility    Modified Rankin (Stroke Patients Only)       Balance Overall balance assessment: Needs assistance Sitting-balance support: Feet supported Sitting balance-Leahy Scale: Normal     Standing balance support: Bilateral upper extremity supported;Reliant on assistive device for balance Standing balance-Leahy Scale: Fair                               Pertinent Vitals/Pain Pain Assessment: No/denies pain    Home Living Family/patient expects to be discharged to:: Assisted living (memory care) Living Arrangements:  (no roommate at ALF) Available Help at Discharge: Available PRN/intermittently;Family Type of Home: Assisted living Home Access: Level entry  Home Layout: One level Home Equipment: Conservation officer, nature (2 wheels) (oxygen)      Prior Function Prior Level of Function : Needs assist       Physical Assist : ADLs (physical)   ADLs (physical): Bathing Mobility Comments: patient is Mod I with ambulation using rolling walker at  ALF. she can routinely ambulate to the dining room ADLs Comments: sister in room reports patient gets assistance with bathing as needed at ALF     Hand Dominance        Extremity/Trunk Assessment   Upper Extremity Assessment Upper Extremity Assessment: Overall WFL for tasks assessed    Lower Extremity Assessment Lower Extremity Assessment: Overall WFL for tasks assessed       Communication   Communication: No difficulties  Cognition Arousal/Alertness: Awake/alert Behavior During Therapy: WFL for tasks assessed/performed Overall Cognitive Status: History of cognitive impairments at baseline                                 General Comments: patient very cooperative and pleasant throughout session. she is able to follow all commands without difficulty. mild short term memory impariments noted.        General Comments      Exercises     Assessment/Plan    PT Assessment Patient does not need any further PT services  PT Problem List         PT Treatment Interventions      PT Goals (Current goals can be found in the Care Plan section)  Acute Rehab PT Goals Patient Stated Goal: to return home PT Goal Formulation: With patient/family Time For Goal Achievement: 11/16/21 Potential to Achieve Goals: Good    Frequency     Barriers to discharge        Co-evaluation               AM-PAC PT "6 Clicks" Mobility  Outcome Measure Help needed turning from your back to your side while in a flat bed without using bedrails?: None Help needed moving from lying on your back to sitting on the side of a flat bed without using bedrails?: None Help needed moving to and from a bed to a chair (including a wheelchair)?: None Help needed standing up from a chair using your arms (e.g., wheelchair or bedside chair)?: None Help needed to walk in hospital room?: None Help needed climbing 3-5 steps with a railing? : None 6 Click Score: 24    End of Session  Equipment Utilized During Treatment: Oxygen Activity Tolerance: Patient tolerated treatment well Patient left: in bed;with call bell/phone within reach;with bed alarm set;with family/visitor present Nurse Communication: Mobility status PT Visit Diagnosis: Muscle weakness (generalized) (M62.81)    Time: 3149-7026 PT Time Calculation (min) (ACUTE ONLY): 17 min   Charges:   PT Evaluation $PT Eval Low Complexity: 1 Low PT Treatments $Therapeutic Activity: 8-22 mins        Minna Merritts, PT, MPT   Percell Locus 11/16/2021, 11:57 AM

## 2021-11-16 NOTE — Progress Notes (Signed)
Obstetric and Gynecology  Subjective  Laura Foster is a 75 y.o. female s/p Cx bx with SCCA of the cervix . Packing in place     Objective   Vitals:   11/16/21 0701 11/16/21 0807  BP: 107/61 103/73  Pulse:  70  Resp: 20 18  Temp: 97.8 F (36.6 C) 98 F (36.7 C)  SpO2: 100% 100%     Intake/Output Summary (Last 24 hours) at 11/16/2021 0848 Last data filed at 11/15/2021 1845 Gross per 24 hour  Intake 1470 ml  Output 25 ml  Net 1445 ml    General: NAD Pelvic :  Vaginal packing removed .  small old blood  Labs: Results for orders placed or performed during the hospital encounter of 11/14/21 (from the past 24 hour(s))  I-STAT, chem 8     Status: Abnormal   Collection Time: 11/15/21 11:37 AM  Result Value Ref Range   Sodium 142 135 - 145 mmol/L   Potassium 4.0 3.5 - 5.1 mmol/L   Chloride 102 98 - 111 mmol/L   BUN 14 8 - 23 mg/dL   Creatinine, Ser 0.80 0.44 - 1.00 mg/dL   Glucose, Bld 94 70 - 99 mg/dL   Calcium, Ion 1.16 1.15 - 1.40 mmol/L   TCO2 26 22 - 32 mmol/L   Hemoglobin 7.8 (L) 12.0 - 15.0 g/dL   HCT 23.0 (L) 36.0 - 46.0 %  CBC with Differential/Platelet     Status: Abnormal   Collection Time: 11/15/21  3:13 PM  Result Value Ref Range   WBC 9.5 4.0 - 10.5 K/uL   RBC 2.89 (L) 3.87 - 5.11 MIL/uL   Hemoglobin 8.8 (L) 12.0 - 15.0 g/dL   HCT 26.8 (L) 36.0 - 46.0 %   MCV 92.7 80.0 - 100.0 fL   MCH 30.4 26.0 - 34.0 pg   MCHC 32.8 30.0 - 36.0 g/dL   RDW 13.1 11.5 - 15.5 %   Platelets 353 150 - 400 K/uL   nRBC 0.0 0.0 - 0.2 %   Neutrophils Relative % 76 %   Neutro Abs 7.2 1.7 - 7.7 K/uL   Lymphocytes Relative 15 %   Lymphs Abs 1.4 0.7 - 4.0 K/uL   Monocytes Relative 8 %   Monocytes Absolute 0.7 0.1 - 1.0 K/uL   Eosinophils Relative 1 %   Eosinophils Absolute 0.1 0.0 - 0.5 K/uL   Basophils Relative 0 %   Basophils Absolute 0.0 0.0 - 0.1 K/uL   Immature Granulocytes 0 %   Abs Immature Granulocytes 0.03 0.00 - 0.07 K/uL    Cultures: Results for orders  placed or performed during the hospital encounter of 11/14/21  Resp Panel by RT-PCR (Flu A&B, Covid) Nasopharyngeal Swab     Status: None   Collection Time: 11/14/21  9:14 PM   Specimen: Nasopharyngeal Swab; Nasopharyngeal(NP) swabs in vial transport medium  Result Value Ref Range Status   SARS Coronavirus 2 by RT PCR NEGATIVE NEGATIVE Final    Comment: (NOTE) SARS-CoV-2 target nucleic acids are NOT DETECTED.  The SARS-CoV-2 RNA is generally detectable in upper respiratory specimens during the acute phase of infection. The lowest concentration of SARS-CoV-2 viral copies this assay can detect is 138 copies/mL. A negative result does not preclude SARS-Cov-2 infection and should not be used as the sole basis for treatment or other patient management decisions. A negative result may occur with  improper specimen collection/handling, submission of specimen other than nasopharyngeal swab, presence of viral mutation(s) within the areas  targeted by this assay, and inadequate number of viral copies(<138 copies/mL). A negative result must be combined with clinical observations, patient history, and epidemiological information. The expected result is Negative.  Fact Sheet for Patients:  EntrepreneurPulse.com.au  Fact Sheet for Healthcare Providers:  IncredibleEmployment.be  This test is no t yet approved or cleared by the [place] FDA and  has been authorized for detection and/or diagnosis of SARS-CoV-2 by FDA under an Emergency Use Authorization (EUA). This EUA will remain  in effect (meaning this test can be used) for the duration of the COVID-19 declaration under Section 564(b)(1) of the Act, 21 U.S.C.section 360bbb-3(b)(1), unless the authorization is terminated  or revoked sooner.       Influenza A by PCR NEGATIVE NEGATIVE Final   Influenza B by PCR NEGATIVE NEGATIVE Final    Comment: (NOTE) The Xpert Xpress SARS-CoV-2/FLU/RSV plus assay is  intended as an aid in the diagnosis of influenza from Nasopharyngeal swab specimens and should not be used as a sole basis for treatment. Nasal washings and aspirates are unacceptable for Xpert Xpress SARS-CoV-2/FLU/RSV testing.  Fact Sheet for Patients: EntrepreneurPulse.com.au  Fact Sheet for Healthcare Providers: IncredibleEmployment.be  This test is not yet approved or cleared by the [place] FDA and has been authorized for detection and/or diagnosis of SARS-CoV-2 by FDA under an Emergency Use Authorization (EUA). This EUA will remain in effect (meaning this test can be used) for the duration of the COVID-19 declaration under Section 564(b)(1) of the Act, 21 U.S.C. section 360bbb-3(b)(1), unless the authorization is terminated or revoked.  Performed at [facility], [address] [place], [place] [address]     Imaging: US PELVIC COMPLETE WITH TRANSVAGINAL  Result Date: 11/15/2021 CLINICAL DATA:  Postmenopausal bleeding x2 days EXAM: TRANSABDOMINAL AND TRANSVAGINAL ULTRASOUND OF PELVIS TECHNIQUE: Both transabdominal and transvaginal ultrasound examinations of the pelvis were performed. Transabdominal technique was performed for global imaging of the pelvis including uterus, ovaries, adnexal regions, and pelvic cul-de-sac. It was necessary to proceed with endovaginal exam following the transabdominal exam to visualize the endometrium. COMPARISON:  None FINDINGS: Uterus Measurements: 8.1 x 4.2 x 4.2 cm = volume: 75 mL. Possible 2.3 x 2.7 x 2.0 cm cervical mass with vascularity, equivocal. Endometrium Thickness: 7 mm.  No focal abnormality visualized. Right ovary Measurements: 6.3 x 4.0 x 5.2 cm = volume: 68 mL. 3.5 x 3.8 x 4.2 cm simple cyst. Left ovary Not discretely visualized.  No adnexal mass is seen. Other findings No abnormal free fluid. IMPRESSION: Endometrial complex measures 7 mm. In the setting of post-menopausal bleeding,  endometrial sampling is indicated to exclude carcinoma. If results are benign, sonohysterogram should be considered for focal lesion work-up. (Ref: Radiological Reasoning: Algorithmic Workup of Abnormal Vaginal Bleeding with Endovaginal Sonography and Sonohysterography. AJR 2008; 188:C16-60) Possible 2.7 cm cervical mass, equivocal. Visual inspection is suggested. 4.2 cm simple ovarian cyst. Recommend follow-up pelvic US in 3-6 months. Reference: Radiology 2019 Nov;293(2):359-371 Electronically Signed   By: Julian Hy M.D.   On: 11/15/2021 00:10     Assessment   75 y.o. No obstetric history on file. Hospital Day: 3 . Cervical carcinoma  Anemia from cervical bleeding - h/h stable currently  Plan  Spoke to the patient regarding her diagnosis today  1. Ptscan + chest abd / pelvic Ctscan today   2. MedOnc consult in place  RadOnc consult   Dr Theora Gianotti , [place] / Onc in [place] for continued care

## 2021-11-16 NOTE — TOC Transition Note (Signed)
Transition of Care [facility]) - CM/SW Discharge Note   Patient Details  Name: Laura Foster MRN: [id number] Date of Birth: 11/03/1946  [facility] CM/SW Contact:  Beverly Sessions, RN Phone Number: 11/16/2021, 2:28 PM   Clinical Narrative:     Patient to discharge to [facility] today Family updated DC summary, fl2, and PT note faxed to [facility] [place].   Family and facility unable to transport.  EMS arranged due to patients O2 need.   EMS packet printed Bedside RN to call report  If patient is not ready for pick up at 5 then bedside Rn to call and change time     Barriers to Discharge: Continued Medical Work up   Patient Goals and CMS Choice        Discharge Placement                       Discharge Plan and Services                                     Social Determinants of Health (SDOH) Interventions     Readmission Risk Interventions Readmission Risk Prevention Plan 11/15/2021  Transportation Screening Complete  Social Work Consult for [place] Planning/Counseling [place] Screening Complete  Medication Review Press photographer) Complete  Some recent data might be hidden

## 2021-11-16 NOTE — NC FL2 (Signed)
[place] LEVEL OF CARE SCREENING TOOL     IDENTIFICATION  Patient Name: Laura Foster Birthdate: 1946/04/19 Sex: female Admission Date (Current Location): 11/14/2021  [facility] and [place] Number:  Engineering geologist and Address:         Provider Number: [id number]  Attending Physician Name and Address:  [name], MD  Relative Name and Phone Number:       Current Level of Care: Hospital Recommended Level of Care: [place], Memory Care Prior Approval Number:    Date Approved/Denied:   PASRR Number:    Discharge Plan: Other (Comment) (ALF memory Care)    Current Diagnoses: Patient Active Problem List   Diagnosis Date Noted   History of GI bleed 11/15/2021   Vaginal bleeding 11/15/2021   Hyponatremia 06/03/2021   Hypoalbuminemia 06/03/2021   Macrocytic anemia 06/03/2021   Thrombocytosis 06/03/2021   Dementia ([place]) 06/03/2021   Hyperlipidemia 06/03/2021   Anxiety 06/03/2021   Acute blood loss anemia    Duodenal ulcer    Acute GI bleeding 05/21/2021   Acute respiratory failure with hypoxia ([place]) 05/18/2021   COPD with acute exacerbation ([place]) 05/18/2021   Closed right hip fracture, with routine healing, subsequent encounter 05/15/2021   Prolonged QT interval 05/15/2021   COPD (chronic obstructive pulmonary disease) ([place]) 05/15/2021   Depression 05/15/2021   Memory disorder 04/24/2018   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   Hypothyroidism 06/17/2012    Orientation RESPIRATION BLADDER Height & Weight     Self, Time, Situation  O2 (3L) Continent Weight: 78.5 kg Height:  5\' 3"  (160 cm)  BEHAVIORAL SYMPTOMS/MOOD NEUROLOGICAL BOWEL NUTRITION STATUS      Continent Diet (Regular)  AMBULATORY STATUS COMMUNICATION OF NEEDS Skin   Limited Assist Verbally Normal                       Personal Care Assistance Level of Assistance              Functional Limitations Info             SPECIAL  CARE FACTORS FREQUENCY                       Contractures Contractures Info: Not present    Additional Factors Info  Code Status, Allergies Code Status Info: Full Allergies Info: Penicillin           TAKE these medications     acetaminophen 500 MG tablet Commonly known as: TYLENOL Take 1 tablet (500 mg total) by mouth every 8 (eight) hours as needed (pain or fever >100). Do not exceed 3000mg  in 24 hours What changed: how much to take    albuterol 108 (90 Base) MCG/ACT inhaler Commonly known as: VENTOLIN HFA Inhale 2 puffs into the lungs every 6 (six) hours as needed for wheezing or shortness of breath.    ALPRAZolam 0.5 MG tablet Commonly known as: XANAX Take 1 tablet (0.5 mg total) by mouth every morning.    atorvastatin 10 MG tablet Commonly known as: LIPITOR Take 10 mg by mouth daily with supper.    ipratropium-albuterol 0.5-2.5 (3) MG/3ML Soln Commonly known as: DUONEB Take 3 mLs by nebulization every 4 (four) hours as needed (COPD).    levothyroxine 75 MCG tablet Commonly known as: SYNTHROID Take 1 tablet (75 mcg total) by mouth daily before breakfast.    melatonin 5 MG Tabs Take 5 mg by mouth at bedtime.  memantine 10 MG tablet Commonly known as: NAMENDA Take 10 mg by mouth 2 (two) times daily.    multivitamin with minerals tablet Take 1 tablet by mouth daily.    pantoprazole 40 MG tablet Commonly known as: Protonix Take 1 tablet (40 mg total) by mouth 2 (two) times daily.    Preparation H 0.25-14-74.9 % rectal ointment Generic drug: phenylephrine-shark liver oil-mineral oil-petrolatum Place 1 application rectally See admin instructions. Apply topically and sparingly 4 times a day as needed.    Spiriva Respimat 2.5 MCG/ACT Aers Generic drug: Tiotropium Bromide Monohydrate Inhale 2 puffs into the lungs daily.    umeclidinium-vilanterol 62.5-25 MCG/INH Aepb Commonly known as: ANORO ELLIPTA Inhale 1 puff into the lungs at bedtime.     Vitamin D3 25 MCG (1000 UT) Caps Take 1,000 Units by mouth daily.   Relevant Imaging Results:  Relevant Lab Results:   Additional Information SSN: [id number].  Beverly Sessions, RN

## 2021-11-16 NOTE — Plan of Care (Signed)
Patient discharged to [facility].  Report called to [facility] who verbalized understanding and acceptance.  Discharge packet with radiation card, appointment reminders, care instructions and medication changes given to EMS for facility.Daughter [name] called and aware of patient discharge to facility. PIV removed prior to discharge.  Patient voided post foley removed prior to discharge.   EMS transported to facility.

## 2021-11-16 NOTE — Plan of Care (Signed)
PMT note:  In to bedside to speak with patient as a consult for [place] has been placed. Patient is not in the room at this time. Will reattempt tomorrow.

## 2021-11-16 NOTE — Op Note (Signed)
NAME: Laura Foster, Laura Foster MEDICAL RECORD NO: [id number] ACCOUNT NO: [id number] DATE OF BIRTH: 1946-04-05 FACILITY: [facility] LOCATION: [facility] PHYSICIAN: Boykin Nearing, MD  Operative Report   DATE OF PROCEDURE: 11/15/2021  PREOPERATIVE DIAGNOSES:   1.  Vaginal bleeding. 2.  Anemia. 3.  Presumed cervical cancer.  POSTOPERATIVE DIAGNOSES: 1.  Vaginal bleeding. 2.  Anemia. 3.  Invasive Squamous cell cervical carcinoma  PROCEDURE: 1.  Cervical biopsies. 2.  Examination under anesthesia.  SURGEON:  Boykin Nearing, MD  FIRST ASSISTANT:  Dr. Santiago Glad.  ANESTHESIA:  General LMA.  INDICATIONS:  A 75 year old female who was admitted a day prior to the procedure with vaginal bleeding and anemia.  My examination on the day of the procedure demonstrated a firm hard cervix consistent with cervical cancer.  The patient was taken to the  operating room to confirm diagnosis.  DESCRIPTION OF PROCEDURE:  After adequate general LMA, the patient's legs were placed in the candy cane stirrups.  Lower abdomen, perineum and vagina were prepped and draped in normal sterile fashion.  Straight catheterization of the bladder yielded 150  mL concentrated urine.  Timeout was performed.  A stenotic cervix was noted with firm cervix and parametrial involvement with the examination.  Three separate cervical biopsies were done at 10 o'clock, 12 o'clock and 2 o'clock.  AstrinGyn was then used  for hemostasis.  Dr. Theora Gianotti performed an examination with me as well as a rectal exam and thought that she had significant parametrial involvement and her exam was consistent with cervical cancer as well.  Frozen section was sent to pathology that  confirmed invasive squamous cell carcinoma and two separate specimens will be sent for permanent section.  Of note, endometrial biopsy could not be performed due to the stenotic vagina and the cervical mass.  A Foley catheter was placed after packing the  vagina  with a Kerlix roll to be used for hemostatic purposes.  The patient tolerated the procedure well.  There were no complications.  ESTIMATED BLOOD LOSS:  25 mL.  INTRAOPERATIVE FLUIDS:  500 mL  URINE OUTPUT:  150 mL  The patient was taken to recovery room in good condition.   VAI D: 11/15/2021 12:55:54 pm T: 11/16/2021 2:46:00 am  JOB: [id number]/ [id number]

## 2021-11-17 LAB — TYPE AND SCREEN
ABO/RH(D): O POS
Antibody Screen: NEGATIVE
Unit division: 0
Unit division: 0

## 2021-11-17 LAB — BPAM RBC
Blood Product Expiration Date: 202301092359
Blood Product Expiration Date: 202301102359
ISSUE DATE / TIME: 202212140532
ISSUE DATE / TIME: 202212151334
Unit Type and Rh: 5100
Unit Type and Rh: 5100

## 2021-11-17 LAB — PREPARE RBC (CROSSMATCH)

## 2021-11-20 ENCOUNTER — Inpatient Hospital Stay: Payer: Medicare Other | Attending: Oncology | Admitting: Oncology

## 2021-11-20 ENCOUNTER — Ambulatory Visit: Payer: Medicare Other | Admitting: Oncology

## 2021-11-20 ENCOUNTER — Inpatient Hospital Stay: Payer: Medicare Other

## 2021-11-20 ENCOUNTER — Encounter: Payer: Self-pay | Admitting: Oncology

## 2021-11-20 ENCOUNTER — Other Ambulatory Visit: Payer: Self-pay

## 2021-11-20 VITALS — BP 121/78 | HR 80 | Temp 97.6°F | Resp 18 | Wt 171.9 lb

## 2021-11-20 DIAGNOSIS — Z510 Encounter for antineoplastic radiation therapy: Secondary | ICD-10-CM | POA: Diagnosis not present

## 2021-11-20 DIAGNOSIS — C539 Malignant neoplasm of cervix uteri, unspecified: Secondary | ICD-10-CM | POA: Insufficient documentation

## 2021-11-20 DIAGNOSIS — D649 Anemia, unspecified: Secondary | ICD-10-CM | POA: Insufficient documentation

## 2021-11-20 DIAGNOSIS — F0390 Unspecified dementia without behavioral disturbance: Secondary | ICD-10-CM | POA: Diagnosis not present

## 2021-11-20 DIAGNOSIS — K769 Liver disease, unspecified: Secondary | ICD-10-CM | POA: Insufficient documentation

## 2021-11-20 DIAGNOSIS — R918 Other nonspecific abnormal finding of lung field: Secondary | ICD-10-CM | POA: Diagnosis not present

## 2021-11-20 DIAGNOSIS — J9611 Chronic respiratory failure with hypoxia: Secondary | ICD-10-CM | POA: Insufficient documentation

## 2021-11-20 DIAGNOSIS — N888 Other specified noninflammatory disorders of cervix uteri: Secondary | ICD-10-CM

## 2021-11-20 DIAGNOSIS — Z7189 Other specified counseling: Secondary | ICD-10-CM | POA: Diagnosis not present

## 2021-11-20 LAB — COMPREHENSIVE METABOLIC PANEL
ALT: 11 U/L (ref 0–44)
AST: 16 U/L (ref 15–41)
Albumin: 3.3 g/dL — ABNORMAL LOW (ref 3.5–5.0)
Alkaline Phosphatase: 81 U/L (ref 38–126)
Anion gap: 7 (ref 5–15)
BUN: 15 mg/dL (ref 8–23)
CO2: 30 mmol/L (ref 22–32)
Calcium: 8.4 mg/dL — ABNORMAL LOW (ref 8.9–10.3)
Chloride: 101 mmol/L (ref 98–111)
Creatinine, Ser: 1.1 mg/dL — ABNORMAL HIGH (ref 0.44–1.00)
GFR, Estimated: 52 mL/min — ABNORMAL LOW (ref >60)
Glucose, Bld: 100 mg/dL — ABNORMAL HIGH (ref 70–99)
Potassium: 3.6 mmol/L (ref 3.5–5.1)
Sodium: 138 mmol/L (ref 135–145)
Total Bilirubin: 0.6 mg/dL (ref 0.3–1.2)
Total Protein: 6.5 g/dL (ref 6.5–8.1)

## 2021-11-20 LAB — CBC WITH DIFFERENTIAL/PLATELET
Abs Immature Granulocytes: 0.02 10*3/uL (ref 0.00–0.07)
Basophils Absolute: 0 10*3/uL (ref 0.0–0.1)
Basophils Relative: 0 %
Eosinophils Absolute: 0.1 10*3/uL (ref 0.0–0.5)
Eosinophils Relative: 1 %
HCT: 30.1 % — ABNORMAL LOW (ref 36.0–46.0)
Hemoglobin: 9.5 g/dL — ABNORMAL LOW (ref 12.0–15.0)
Immature Granulocytes: 0 %
Lymphocytes Relative: 8 %
Lymphs Abs: 0.7 10*3/uL (ref 0.7–4.0)
MCH: 29.8 pg (ref 26.0–34.0)
MCHC: 31.6 g/dL (ref 30.0–36.0)
MCV: 94.4 fL (ref 80.0–100.0)
Monocytes Absolute: 0.7 10*3/uL (ref 0.1–1.0)
Monocytes Relative: 8 %
Neutro Abs: 7 10*3/uL (ref 1.7–7.7)
Neutrophils Relative %: 83 %
Platelets: 335 10*3/uL (ref 150–400)
RBC: 3.19 MIL/uL — ABNORMAL LOW (ref 3.87–5.11)
RDW: 12.9 % (ref 11.5–15.5)
WBC: 8.5 10*3/uL (ref 4.0–10.5)
nRBC: 0 % (ref 0.0–0.2)

## 2021-11-20 LAB — IRON AND TIBC
Iron: 23 ug/dL — ABNORMAL LOW (ref 28–170)
Saturation Ratios: 11 % (ref 10.4–31.8)
TIBC: 204 ug/dL — ABNORMAL LOW (ref 250–450)
UIBC: 181 ug/dL

## 2021-11-20 LAB — FERRITIN: Ferritin: 77 ng/mL (ref 11–307)

## 2021-11-20 NOTE — Progress Notes (Signed)
Pt has dementia. Per Daughter(healthcare power of attorney) pt has had increased anxiety since being discharged from hospital.
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
## 2021-11-21 ENCOUNTER — Ambulatory Visit
Admission: RE | Admit: 2021-11-21 | Discharge: 2021-11-21 | Disposition: A | Discharge status: Home / Self Care | Payer: Medicare Other | Source: Ambulatory Visit | Attending: Radiation Oncology | Admitting: Radiation Oncology

## 2021-11-21 DIAGNOSIS — Z510 Encounter for antineoplastic radiation therapy: Secondary | ICD-10-CM | POA: Diagnosis not present

## 2021-11-21 DIAGNOSIS — D500 Iron deficiency anemia secondary to blood loss (chronic): Secondary | ICD-10-CM | POA: Diagnosis not present

## 2021-11-22 ENCOUNTER — Encounter: Payer: Medicare Other | Admitting: Hospice and Palliative Medicine

## 2021-11-22 ENCOUNTER — Ambulatory Visit
Admission: RE | Admit: 2021-11-22 | Discharge: 2021-11-22 | Disposition: A | Discharge status: Home / Self Care | Payer: Medicare Other | Source: Ambulatory Visit | Attending: Radiation Oncology | Admitting: Radiation Oncology

## 2021-11-22 DIAGNOSIS — Z510 Encounter for antineoplastic radiation therapy: Secondary | ICD-10-CM | POA: Diagnosis not present

## 2021-11-23 ENCOUNTER — Ambulatory Visit
Admission: RE | Admit: 2021-11-23 | Discharge: 2021-11-23 | Disposition: A | Discharge status: Home / Self Care | Payer: Medicare Other | Source: Ambulatory Visit | Attending: Radiation Oncology | Admitting: Radiation Oncology

## 2021-11-23 DIAGNOSIS — R112 Nausea with vomiting, unspecified: Secondary | ICD-10-CM | POA: Diagnosis not present

## 2021-11-23 DIAGNOSIS — D500 Iron deficiency anemia secondary to blood loss (chronic): Secondary | ICD-10-CM | POA: Diagnosis not present

## 2021-11-23 DIAGNOSIS — G301 Alzheimer's disease with late onset: Secondary | ICD-10-CM | POA: Diagnosis not present

## 2021-11-23 DIAGNOSIS — F02B0 Dementia in other diseases classified elsewhere, moderate, without behavioral disturbance, psychotic disturbance, mood disturbance, and anxiety: Secondary | ICD-10-CM | POA: Diagnosis not present

## 2021-11-23 DIAGNOSIS — J441 Chronic obstructive pulmonary disease with (acute) exacerbation: Secondary | ICD-10-CM | POA: Diagnosis not present

## 2021-11-23 DIAGNOSIS — Z510 Encounter for antineoplastic radiation therapy: Secondary | ICD-10-CM | POA: Diagnosis not present

## 2021-11-24 ENCOUNTER — Ambulatory Visit
Admission: RE | Admit: 2021-11-24 | Discharge: 2021-11-24 | Disposition: A | Discharge status: Home / Self Care | Payer: Medicare Other | Source: Ambulatory Visit | Attending: Radiation Oncology | Admitting: Radiation Oncology

## 2021-11-24 ENCOUNTER — Non-Acute Institutional Stay: Payer: Self-pay | Admitting: Student

## 2021-11-24 ENCOUNTER — Other Ambulatory Visit: Payer: Self-pay

## 2021-11-24 DIAGNOSIS — R110 Nausea: Secondary | ICD-10-CM

## 2021-11-24 DIAGNOSIS — R630 Anorexia: Secondary | ICD-10-CM

## 2021-11-24 DIAGNOSIS — Z515 Encounter for palliative care: Secondary | ICD-10-CM | POA: Diagnosis not present

## 2021-11-24 DIAGNOSIS — Z510 Encounter for antineoplastic radiation therapy: Secondary | ICD-10-CM | POA: Diagnosis not present

## 2021-11-24 DIAGNOSIS — F0390 Unspecified dementia without behavioral disturbance: Secondary | ICD-10-CM

## 2021-11-24 DIAGNOSIS — F419 Anxiety disorder, unspecified: Secondary | ICD-10-CM

## 2021-11-24 DIAGNOSIS — R0602 Shortness of breath: Secondary | ICD-10-CM | POA: Diagnosis not present

## 2021-11-24 DIAGNOSIS — C539 Malignant neoplasm of cervix uteri, unspecified: Secondary | ICD-10-CM

## 2021-11-24 NOTE — Progress Notes (Signed)
[facility] Consult Note Telephone: [phone]  Fax: [phone]   Date of encounter: 11/24/21 2:34 PM PATIENT NAME: Laura Foster [place] [id number]   [phone] (home)  DOB: 1946-05-28 MRN: [id number] PRIMARY CARE PROVIDER:    [facility]/ Virgie Dad, NP  REFERRING PROVIDER:   Virgie Dad, NP  RESPONSIBLE PARTY:    Contact Information     Name Relation Home Work [place] Daughter [place]   Meta Hatchet   [phone]        I met face to face with patient in the facility. Palliative Care was asked to follow this patient by consultation request of  Virgie Dad, NP to address advance care planning and complex medical decision making. This is the initial visit.   Discussed with daughter Laura Foster via telephone.                                     ASSESSMENT AND PLAN / RECOMMENDATIONS:   Advance Care Planning/Goals of Care: Goals include to maximize quality of life and symptom management. Patient/health care surrogate gave his/her permission to discuss.Our advance care planning conversation included a discussion about:    The value and importance of advance care planning  Experiences with loved ones who have been seriously ill or have died  Exploration of personal, cultural or spiritual beliefs that might influence medical decisions  Exploration of goals of care in the event of a sudden injury or illness  Identification and preparation of a healthcare agent  Review and updating or creation of an  advance directive document . Decision not to resuscitate or to de-escalate disease focused treatments due to poor prognosis. CODE STATUS: DNR  Education provided on Palliative Medicine vs. Hospice services. Discussed aggressive treatments vs. Comfort path. Patient will continue radiation as able to tolerate. Due to her dementia, she has very limited  understanding of her disease. Palliative medicine will continue to provide support. We did discuss transitioning to hospice when she is no longer receiving treatments, should she decline further. Discussed code status. Family is in agreement with DNR.   I spent 25 minutes providing this consultation. More than 50% of the time in this consultation was spent in counseling and care coordination.  --------------------------------------------------------------------------------------------------------- Symptom Management/Plan:  Cervical squamous cell carcinoma-Stage III.Patient currently receiving radiation for symptom management. No chemotherapy given her dementia and COPD. Follow up with oncology, radiation oncology as scheduled.   Nausea-patient receiving ondansetron PRN; little relief per staff. Discussed options, given she is ambulatory, at risk for falls. Will start Zyprexa $RemoveBefor'5mg'vxGXOTcMgXls$  QD at 6pm; continue PRN ondansetron.  Alzheimer's dementia-patient with anxiety. Staff to continue providing supportive care. Reorient and redirect as needed. Continue Namenda, Aricept, sertraline, alprazolam as directed. Monitor for falls/safety.   Appetite-patient not eating as well due to her nausea, occasional vomiting. Will start Zyprexa in hopes of managing her nausea. Staff to continue offering foods patient enjoys, continue Mighty shakes BID. Routine weights recommended.    Shortness of breath-due to her COPD. Continue oxygen continuously as directed; continue inhalers, nebulizer as scheduled.   Follow up Palliative Care Visit: Palliative care will continue to follow for complex medical decision making, advance care planning, and clarification of goals. Return 4-6 weeks or prn.    This visit was coded based on medical decision making (MDM).  PPS: 50%  HOSPICE  ELIGIBILITY/DIAGNOSIS: TBD  Chief Complaint: Palliative Medicine initial consult.   HISTORY OF PRESENT ILLNESS:  Laura Foster is a 75 y.o. year  old female  with primary cervical squamous cell carcinoma, Alzheimer's dementia, COPD, chronic respiratory failure, acute on chronic anemia.   Patient currently resides at [facility]. She reports doing okay. She does not have a clear understanding of her cancer diagnosis and does not remember that she is receiving radiation. She denies pain. Staff report patient will occasionally yell out but denies pain when she is asked. She does have shortness of breath with exertion; wears oxygen continuously. Staff report nausea, occasional vomiting. She has received the ondansetron PRN, but with little relief. No vaginal bleeding reported. She is sleeping well at night. She requires some assistance with adl's. She uses walker for ambulation. She is sleeping well at night. Fair appetite reported. HPI and ROS primarily obtained from staff and daughter to her her dementia.  History obtained from review of EMR, discussion with primary team, and interview with family, facility staff/caregiver and/or Laura Foster.  I reviewed available labs, medications, imaging, studies and related documents from the EMR.  Records reviewed and summarized above.   ROS  Unable to obtain due to patient's dementia.   Physical Exam: Pulse 72, resp 20, b/p 140/72, sats 98% Constitutional: NAD General: frail appearing EYES: anicteric sclera, lids intact, no discharge  ENMT: intact hearing, oral mucous membranes moist, dentition intact CV: S1S2, RRR, no LE edema Pulmonary: Lungs clear, diminished, no increased work of breathing, no cough,  Abdomen: normo-active BS + 4 quadrants, soft and non tender, no ascites GU: deferred MSK: moves all extremities, ambulatory Skin: warm and dry, no rashes or wounds on visible skin Neuro:  no generalized weakness, A & O x 2, forgetful Psych: non-anxious affect, pleasant Hem/lymph/immuno: no widespread bruising CURRENT PROBLEM LIST:  Patient Active Problem List    Diagnosis Date Noted   Primary cervical squamous cell carcinoma (HCC) 11/20/2021   Goals of care, counseling/discussion 11/20/2021   Chronic respiratory failure with hypoxia (HCC) 11/20/2021   History of GI bleed 11/15/2021   Vaginal bleeding 11/15/2021   Hyponatremia 06/03/2021   Hypoalbuminemia 06/03/2021   Macrocytic anemia 06/03/2021   Thrombocytosis 06/03/2021   Dementia (HCC) 06/03/2021   Hyperlipidemia 06/03/2021   Anxiety 06/03/2021   Acute blood loss anemia    Duodenal ulcer    Acute GI bleeding 05/21/2021   Acute respiratory failure with hypoxia (HCC) 05/18/2021   COPD with acute exacerbation (HCC) 05/18/2021   Closed right hip fracture, with routine healing, subsequent encounter 05/15/2021   Prolonged QT interval 05/15/2021   COPD (chronic obstructive pulmonary disease) (HCC) 05/15/2021   Depression 05/15/2021   Memory disorder 04/24/2018   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   Hypothyroidism 06/17/2012   PAST MEDICAL HISTORY:  Active Ambulatory Problems    Diagnosis Date Noted   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   Memory disorder 04/24/2018   Closed right hip fracture, with routine healing, subsequent encounter 05/15/2021   Prolonged QT interval 05/15/2021   COPD (chronic obstructive pulmonary disease) (HCC) 05/15/2021   Depression 05/15/2021   Acute respiratory failure with hypoxia (HCC) 05/18/2021   COPD with acute exacerbation (HCC) 05/18/2021   Acute GI bleeding 05/21/2021   Duodenal ulcer    Acute blood loss anemia    Hypothyroidism 06/17/2012   Hyponatremia 06/03/2021   Hypoalbuminemia 06/03/2021   Macrocytic anemia 06/03/2021   Thrombocytosis 06/03/2021   Dementia (HCC) 06/03/2021  Hyperlipidemia 06/03/2021   Anxiety 06/03/2021   History of GI bleed 11/15/2021   Vaginal bleeding 11/15/2021   Primary cervical squamous cell carcinoma ([place]) 11/20/2021   Goals of care, counseling/discussion 11/20/2021    Chronic respiratory failure with hypoxia ([place]) 11/20/2021   Resolved Ambulatory Problems    Diagnosis Date Noted   No Resolved Ambulatory Problems   Past Medical History:  Diagnosis Date   Dyspnea    Hypercholesteremia    Memory loss    Renal insufficiency    SOCIAL HX:  Social History   Tobacco Use   Smoking status: Former    Packs/day: 0.50    Types: Cigarettes    Quit date: 04/02/2021    Years since quitting: 0.6   Smokeless tobacco: Never  Substance Use Topics   Alcohol use: No   FAMILY HX:  Family History  Problem Relation Age of Onset   Diabetes Mother    Heart attack Father    Heart attack Brother    Cancer Brother       ALLERGIES:  Allergies  Allergen Reactions   Penicillins Rash     PERTINENT MEDICATIONS:  Outpatient Encounter Medications as of 11/24/2021  Medication Sig   acetaminophen (TYLENOL) 500 MG tablet Take 1 tablet (500 mg total) by mouth every 8 (eight) hours as needed (pain or fever >100). Do not exceed $RemoveBe'3000mg'aavIIjZmB$  in 24 hours   albuterol (VENTOLIN HFA) 108 (90 Base) MCG/ACT inhaler Inhale 2 puffs into the lungs every 6 (six) hours as needed for wheezing or shortness of breath.   ALPRAZolam (XANAX) 0.5 MG tablet Take 1 tablet (0.5 mg total) by mouth every morning.   atorvastatin (LIPITOR) 10 MG tablet Take 10 mg by mouth daily with supper.   Cholecalciferol (VITAMIN D3) 1000 units CAPS Take 1,000 Units by mouth daily.   donepezil (ARICEPT) 5 MG tablet Take 5 mg by mouth at bedtime.   ipratropium-albuterol (DUONEB) 0.5-2.5 (3) MG/3ML SOLN Take 3 mLs by nebulization every 4 (four) hours as needed (COPD).   levothyroxine (SYNTHROID, LEVOTHROID) 75 MCG tablet Take 1 tablet (75 mcg total) by mouth daily before breakfast.   melatonin 5 MG TABS Take 5 mg by mouth at bedtime.   memantine (NAMENDA) 10 MG tablet Take 10 mg by mouth 2 (two) times daily.   Multiple Vitamins-Minerals (MULTIVITAMIN WITH MINERALS) tablet Take 1 tablet by mouth daily.   pantoprazole  (PROTONIX) 40 MG tablet Take 1 tablet (40 mg total) by mouth 2 (two) times daily.   PREPARATION H 0.25-14-74.9 % rectal ointment Place 1 application rectally See admin instructions. Apply topically and sparingly 4 times a day as needed.   sucralfate (CARAFATE) 1 g tablet Take 1 g by mouth 4 (four) times daily.   Tiotropium Bromide Monohydrate (SPIRIVA RESPIMAT) 2.5 MCG/ACT AERS Inhale 2 puffs into the lungs daily.   umeclidinium-vilanterol (ANORO ELLIPTA) 62.5-25 MCG/INH AEPB Inhale 1 puff into the lungs at bedtime.   No facility-administered encounter medications on file as of 11/24/2021.   Thank you for the opportunity to participate in the care of Ms. Abram.  The palliative care team will continue to follow. Please call our office at [phone] if we can be of additional assistance.   Ezekiel Slocumb, NP ,   COVID-19 PATIENT SCREENING TOOL Asked and negative response unless otherwise noted:  Have you had symptoms of covid, tested positive or been in contact with someone with symptoms/positive test in the past 5-10 days? No

## 2021-11-28 ENCOUNTER — Ambulatory Visit
Admission: RE | Admit: 2021-11-28 | Discharge: 2021-11-28 | Disposition: A | Discharge status: Home / Self Care | Payer: Medicare Other | Source: Ambulatory Visit | Attending: Radiation Oncology | Admitting: Radiation Oncology

## 2021-11-28 DIAGNOSIS — Z510 Encounter for antineoplastic radiation therapy: Secondary | ICD-10-CM | POA: Diagnosis not present

## 2021-11-29 ENCOUNTER — Ambulatory Visit
Admission: RE | Admit: 2021-11-29 | Discharge: 2021-11-29 | Disposition: A | Discharge status: Home / Self Care | Payer: Medicare Other | Source: Ambulatory Visit | Attending: Radiation Oncology | Admitting: Radiation Oncology

## 2021-11-29 ENCOUNTER — Ambulatory Visit
Admission: RE | Admit: 2021-11-29 | Discharge: 2021-11-29 | Disposition: A | Discharge status: Home / Self Care | Payer: Medicare Other | Source: Ambulatory Visit | Attending: Obstetrics and Gynecology | Admitting: Obstetrics and Gynecology

## 2021-11-29 DIAGNOSIS — R5381 Other malaise: Secondary | ICD-10-CM | POA: Diagnosis not present

## 2021-11-29 DIAGNOSIS — Z510 Encounter for antineoplastic radiation therapy: Secondary | ICD-10-CM | POA: Diagnosis not present

## 2021-11-29 DIAGNOSIS — Z66 Do not resuscitate: Secondary | ICD-10-CM | POA: Diagnosis not present

## 2021-11-29 DIAGNOSIS — F0394 Unspecified dementia, unspecified severity, with anxiety: Secondary | ICD-10-CM | POA: Diagnosis not present

## 2021-11-29 DIAGNOSIS — D494 Neoplasm of unspecified behavior of bladder: Secondary | ICD-10-CM | POA: Diagnosis not present

## 2021-11-29 DIAGNOSIS — Z8249 Family history of ischemic heart disease and other diseases of the circulatory system: Secondary | ICD-10-CM | POA: Diagnosis not present

## 2021-11-29 DIAGNOSIS — D62 Acute posthemorrhagic anemia: Secondary | ICD-10-CM | POA: Diagnosis not present

## 2021-11-29 DIAGNOSIS — Z515 Encounter for palliative care: Secondary | ICD-10-CM | POA: Diagnosis not present

## 2021-11-29 DIAGNOSIS — E7800 Pure hypercholesterolemia, unspecified: Secondary | ICD-10-CM | POA: Diagnosis not present

## 2021-11-29 DIAGNOSIS — I700 Atherosclerosis of aorta: Secondary | ICD-10-CM | POA: Diagnosis not present

## 2021-11-29 DIAGNOSIS — N888 Other specified noninflammatory disorders of cervix uteri: Secondary | ICD-10-CM

## 2021-11-29 DIAGNOSIS — Z79899 Other long term (current) drug therapy: Secondary | ICD-10-CM | POA: Diagnosis not present

## 2021-11-29 DIAGNOSIS — N135 Crossing vessel and stricture of ureter without hydronephrosis: Secondary | ICD-10-CM | POA: Diagnosis not present

## 2021-11-29 DIAGNOSIS — E876 Hypokalemia: Secondary | ICD-10-CM | POA: Diagnosis not present

## 2021-11-29 DIAGNOSIS — Z923 Personal history of irradiation: Secondary | ICD-10-CM | POA: Diagnosis not present

## 2021-11-29 DIAGNOSIS — N131 Hydronephrosis with ureteral stricture, not elsewhere classified: Secondary | ICD-10-CM | POA: Diagnosis not present

## 2021-11-29 DIAGNOSIS — N179 Acute kidney failure, unspecified: Secondary | ICD-10-CM | POA: Diagnosis not present

## 2021-11-29 DIAGNOSIS — N139 Obstructive and reflux uropathy, unspecified: Secondary | ICD-10-CM | POA: Diagnosis not present

## 2021-11-29 DIAGNOSIS — Z20822 Contact with and (suspected) exposure to covid-19: Secondary | ICD-10-CM | POA: Diagnosis not present

## 2021-11-29 DIAGNOSIS — Z743 Need for continuous supervision: Secondary | ICD-10-CM | POA: Diagnosis not present

## 2021-11-29 DIAGNOSIS — Z833 Family history of diabetes mellitus: Secondary | ICD-10-CM | POA: Diagnosis not present

## 2021-11-29 DIAGNOSIS — Z7951 Long term (current) use of inhaled steroids: Secondary | ICD-10-CM | POA: Diagnosis not present

## 2021-11-29 DIAGNOSIS — R531 Weakness: Secondary | ICD-10-CM | POA: Diagnosis not present

## 2021-11-29 DIAGNOSIS — Z87891 Personal history of nicotine dependence: Secondary | ICD-10-CM | POA: Diagnosis not present

## 2021-11-29 DIAGNOSIS — E039 Hypothyroidism, unspecified: Secondary | ICD-10-CM | POA: Diagnosis not present

## 2021-11-29 DIAGNOSIS — F0393 Unspecified dementia, unspecified severity, with mood disturbance: Secondary | ICD-10-CM | POA: Diagnosis not present

## 2021-11-29 DIAGNOSIS — Z9981 Dependence on supplemental oxygen: Secondary | ICD-10-CM | POA: Diagnosis not present

## 2021-11-29 LAB — GLUCOSE, CAPILLARY: Glucose-Capillary: 83 mg/dL (ref 70–99)

## 2021-11-29 MED ORDER — FLUDEOXYGLUCOSE F - 18 (FDG) INJECTION
8.9000 | Freq: Once | INTRAVENOUS | Status: AC | PRN
  Administered 2021-11-29: 12:00:00 9.54 via INTRAVENOUS

## 2021-11-30 ENCOUNTER — Telehealth: Payer: Self-pay | Admitting: Nurse Practitioner

## 2021-11-30 ENCOUNTER — Telehealth: Payer: Self-pay | Admitting: *Deleted

## 2021-11-30 ENCOUNTER — Other Ambulatory Visit: Payer: Self-pay

## 2021-11-30 ENCOUNTER — Telehealth: Payer: Self-pay

## 2021-11-30 ENCOUNTER — Ambulatory Visit
Admission: RE | Admit: 2021-11-30 | Discharge: 2021-11-30 | Disposition: A | Discharge status: Home / Self Care | Payer: Medicare Other | Source: Ambulatory Visit | Attending: Radiation Oncology | Admitting: Radiation Oncology

## 2021-11-30 DIAGNOSIS — Z7951 Long term (current) use of inhaled steroids: Secondary | ICD-10-CM

## 2021-11-30 DIAGNOSIS — Z515 Encounter for palliative care: Principal | ICD-10-CM

## 2021-11-30 DIAGNOSIS — E039 Hypothyroidism, unspecified: Secondary | ICD-10-CM | POA: Diagnosis present

## 2021-11-30 DIAGNOSIS — R5381 Other malaise: Secondary | ICD-10-CM | POA: Diagnosis not present

## 2021-11-30 DIAGNOSIS — N939 Abnormal uterine and vaginal bleeding, unspecified: Secondary | ICD-10-CM | POA: Diagnosis present

## 2021-11-30 DIAGNOSIS — Z20822 Contact with and (suspected) exposure to covid-19: Secondary | ICD-10-CM | POA: Diagnosis present

## 2021-11-30 DIAGNOSIS — Z79890 Hormone replacement therapy (postmenopausal): Secondary | ICD-10-CM

## 2021-11-30 DIAGNOSIS — Z833 Family history of diabetes mellitus: Secondary | ICD-10-CM

## 2021-11-30 DIAGNOSIS — Z79899 Other long term (current) drug therapy: Secondary | ICD-10-CM

## 2021-11-30 DIAGNOSIS — Z87891 Personal history of nicotine dependence: Secondary | ICD-10-CM

## 2021-11-30 DIAGNOSIS — N179 Acute kidney failure, unspecified: Secondary | ICD-10-CM | POA: Diagnosis present

## 2021-11-30 DIAGNOSIS — E7800 Pure hypercholesterolemia, unspecified: Secondary | ICD-10-CM | POA: Diagnosis present

## 2021-11-30 DIAGNOSIS — F0393 Unspecified dementia, unspecified severity, with mood disturbance: Secondary | ICD-10-CM | POA: Diagnosis present

## 2021-11-30 DIAGNOSIS — N131 Hydronephrosis with ureteral stricture, not elsewhere classified: Secondary | ICD-10-CM | POA: Diagnosis present

## 2021-11-30 DIAGNOSIS — Z510 Encounter for antineoplastic radiation therapy: Secondary | ICD-10-CM | POA: Diagnosis not present

## 2021-11-30 DIAGNOSIS — I700 Atherosclerosis of aorta: Secondary | ICD-10-CM | POA: Diagnosis present

## 2021-11-30 DIAGNOSIS — Z66 Do not resuscitate: Secondary | ICD-10-CM | POA: Diagnosis present

## 2021-11-30 DIAGNOSIS — Z743 Need for continuous supervision: Secondary | ICD-10-CM | POA: Diagnosis not present

## 2021-11-30 DIAGNOSIS — F0394 Unspecified dementia, unspecified severity, with anxiety: Secondary | ICD-10-CM | POA: Diagnosis present

## 2021-11-30 DIAGNOSIS — R531 Weakness: Secondary | ICD-10-CM | POA: Diagnosis not present

## 2021-11-30 DIAGNOSIS — C539 Malignant neoplasm of cervix uteri, unspecified: Secondary | ICD-10-CM

## 2021-11-30 DIAGNOSIS — E876 Hypokalemia: Secondary | ICD-10-CM | POA: Diagnosis present

## 2021-11-30 DIAGNOSIS — N139 Obstructive and reflux uropathy, unspecified: Secondary | ICD-10-CM

## 2021-11-30 DIAGNOSIS — D62 Acute posthemorrhagic anemia: Secondary | ICD-10-CM | POA: Diagnosis present

## 2021-11-30 DIAGNOSIS — Z8249 Family history of ischemic heart disease and other diseases of the circulatory system: Secondary | ICD-10-CM

## 2021-11-30 DIAGNOSIS — Z923 Personal history of irradiation: Secondary | ICD-10-CM

## 2021-11-30 DIAGNOSIS — Z9981 Dependence on supplemental oxygen: Secondary | ICD-10-CM

## 2021-11-30 NOTE — ED Triage Notes (Signed)
Pt comes via EMS with c/o abnormal CT results EMS reports no complaints per pt and VSS. Pt is from [place].  CT scan results showed:Findings of locally advanced cervical neoplasm with involvement of the bladder base, parametrium and uterus leading to bilateral ureteral obstruction.

## 2021-11-30 NOTE — Telephone Encounter (Signed)
PET scan results showed rupture of collecting systems and/or renal pelvis on left with urine in anterior pararenal space. Associated urinary obstruction. Additionally, known locally advanced cervical cancer with involvement in bladder base, parametrium and uterus which is likely cause of urinary findings as previously mentioned.   Findings discussed with Dr Tasia Catchings and Dr. Baruch Gouty. Dr Tasia Catchings recommends ER for probable admission and urology consultation vs hospice. Family had previously declined chemotherapy and patient is receiving radiation only. I discussed with patient's POA who agrees with ER and will notify memory care center where patient resides. Recommend urology and palliative care consult.

## 2021-11-30 NOTE — Telephone Encounter (Signed)
Called and left voicemail with daughter, Ernestina Penna, to return call to discuss PET results and need for referral to Urology.

## 2021-11-30 NOTE — Telephone Encounter (Signed)
Called report  IMPRESSION: Findings of locally advanced cervical neoplasm with involvement of the bladder base, parametrium and uterus leading to bilateral ureteral obstruction.   Rupture of collecting systems and or renal pelvis on the LEFT associated with urine in the anterior pararenal space. Urologic consultation is suggested.   Limited excretion of FDG into the urinary bladder even at greater than 1 hour. This further supports the substantial degree of urinary tract obstruction.   No signs of distant metastatic disease at this time   Aortic Atherosclerosis (ICD10-I70.0).   These results will be called to the ordering clinician or representative by the Radiologist Assistant, and communication documented in the PACS or [employer].     Electronically Signed   By: Zetta Bills M.D.   On: 11/30/2021 14:06

## 2021-12-01 ENCOUNTER — Ambulatory Visit: Payer: Medicare Other

## 2021-12-01 ENCOUNTER — Inpatient Hospital Stay
Admission: EM | Admit: 2021-12-01 | Discharge: 2021-12-01 | DRG: 951 | Disposition: A | Discharge status: Hospice | Payer: Medicare Other | Attending: Internal Medicine | Admitting: Internal Medicine

## 2021-12-01 ENCOUNTER — Encounter: Payer: Self-pay | Admitting: Internal Medicine

## 2021-12-01 DIAGNOSIS — Z87891 Personal history of nicotine dependence: Secondary | ICD-10-CM | POA: Diagnosis not present

## 2021-12-01 DIAGNOSIS — Z8249 Family history of ischemic heart disease and other diseases of the circulatory system: Secondary | ICD-10-CM | POA: Diagnosis not present

## 2021-12-01 DIAGNOSIS — E039 Hypothyroidism, unspecified: Secondary | ICD-10-CM | POA: Diagnosis not present

## 2021-12-01 DIAGNOSIS — N179 Acute kidney failure, unspecified: Secondary | ICD-10-CM

## 2021-12-01 DIAGNOSIS — E876 Hypokalemia: Secondary | ICD-10-CM | POA: Diagnosis present

## 2021-12-01 DIAGNOSIS — Z833 Family history of diabetes mellitus: Secondary | ICD-10-CM | POA: Diagnosis not present

## 2021-12-01 DIAGNOSIS — D62 Acute posthemorrhagic anemia: Secondary | ICD-10-CM

## 2021-12-01 DIAGNOSIS — Z515 Encounter for palliative care: Secondary | ICD-10-CM | POA: Diagnosis not present

## 2021-12-01 DIAGNOSIS — F0393 Unspecified dementia, unspecified severity, with mood disturbance: Secondary | ICD-10-CM | POA: Diagnosis present

## 2021-12-01 DIAGNOSIS — Z923 Personal history of irradiation: Secondary | ICD-10-CM | POA: Diagnosis not present

## 2021-12-01 DIAGNOSIS — F418 Other specified anxiety disorders: Secondary | ICD-10-CM | POA: Diagnosis not present

## 2021-12-01 DIAGNOSIS — R5381 Other malaise: Secondary | ICD-10-CM | POA: Diagnosis not present

## 2021-12-01 DIAGNOSIS — F0390 Unspecified dementia without behavioral disturbance: Secondary | ICD-10-CM | POA: Diagnosis present

## 2021-12-01 DIAGNOSIS — N131 Hydronephrosis with ureteral stricture, not elsewhere classified: Secondary | ICD-10-CM | POA: Diagnosis present

## 2021-12-01 DIAGNOSIS — J449 Chronic obstructive pulmonary disease, unspecified: Secondary | ICD-10-CM | POA: Diagnosis not present

## 2021-12-01 DIAGNOSIS — I700 Atherosclerosis of aorta: Secondary | ICD-10-CM | POA: Diagnosis present

## 2021-12-01 DIAGNOSIS — C539 Malignant neoplasm of cervix uteri, unspecified: Secondary | ICD-10-CM

## 2021-12-01 DIAGNOSIS — R6889 Other general symptoms and signs: Secondary | ICD-10-CM | POA: Diagnosis not present

## 2021-12-01 DIAGNOSIS — F0394 Unspecified dementia, unspecified severity, with anxiety: Secondary | ICD-10-CM | POA: Diagnosis present

## 2021-12-01 DIAGNOSIS — Z66 Do not resuscitate: Secondary | ICD-10-CM | POA: Diagnosis not present

## 2021-12-01 DIAGNOSIS — Z7951 Long term (current) use of inhaled steroids: Secondary | ICD-10-CM | POA: Diagnosis not present

## 2021-12-01 DIAGNOSIS — N135 Crossing vessel and stricture of ureter without hydronephrosis: Secondary | ICD-10-CM | POA: Diagnosis present

## 2021-12-01 DIAGNOSIS — E7800 Pure hypercholesterolemia, unspecified: Secondary | ICD-10-CM | POA: Diagnosis present

## 2021-12-01 DIAGNOSIS — Z7401 Bed confinement status: Secondary | ICD-10-CM | POA: Diagnosis not present

## 2021-12-01 DIAGNOSIS — R413 Other amnesia: Secondary | ICD-10-CM | POA: Insufficient documentation

## 2021-12-01 DIAGNOSIS — Z79890 Hormone replacement therapy (postmenopausal): Secondary | ICD-10-CM | POA: Diagnosis not present

## 2021-12-01 DIAGNOSIS — Z9981 Dependence on supplemental oxygen: Secondary | ICD-10-CM | POA: Diagnosis not present

## 2021-12-01 DIAGNOSIS — N939 Abnormal uterine and vaginal bleeding, unspecified: Secondary | ICD-10-CM

## 2021-12-01 DIAGNOSIS — Z20822 Contact with and (suspected) exposure to covid-19: Secondary | ICD-10-CM | POA: Diagnosis present

## 2021-12-01 DIAGNOSIS — Z79899 Other long term (current) drug therapy: Secondary | ICD-10-CM | POA: Diagnosis not present

## 2021-12-01 HISTORY — DX: Malignant (primary) neoplasm, unspecified: C80.1

## 2021-12-01 LAB — CBC WITH DIFFERENTIAL/PLATELET
Abs Immature Granulocytes: 0.07 10*3/uL (ref 0.00–0.07)
Basophils Absolute: 0 10*3/uL (ref 0.0–0.1)
Basophils Relative: 0 %
Eosinophils Absolute: 0.1 10*3/uL (ref 0.0–0.5)
Eosinophils Relative: 1 %
HCT: 28.1 % — ABNORMAL LOW (ref 36.0–46.0)
Hemoglobin: 9 g/dL — ABNORMAL LOW (ref 12.0–15.0)
Immature Granulocytes: 1 %
Lymphocytes Relative: 4 %
Lymphs Abs: 0.4 10*3/uL — ABNORMAL LOW (ref 0.7–4.0)
MCH: 30.4 pg (ref 26.0–34.0)
MCHC: 32 g/dL (ref 30.0–36.0)
MCV: 94.9 fL (ref 80.0–100.0)
Monocytes Absolute: 0.8 10*3/uL (ref 0.1–1.0)
Monocytes Relative: 7 %
Neutro Abs: 9.2 10*3/uL — ABNORMAL HIGH (ref 1.7–7.7)
Neutrophils Relative %: 87 %
Platelets: 362 10*3/uL (ref 150–400)
RBC: 2.96 MIL/uL — ABNORMAL LOW (ref 3.87–5.11)
RDW: 13 % (ref 11.5–15.5)
WBC: 10.5 10*3/uL (ref 4.0–10.5)
nRBC: 0 % (ref 0.0–0.2)

## 2021-12-01 LAB — PROTIME-INR
INR: 1.3 — ABNORMAL HIGH (ref 0.8–1.2)
Prothrombin Time: 15.7 seconds — ABNORMAL HIGH (ref 11.4–15.2)

## 2021-12-01 LAB — COMPREHENSIVE METABOLIC PANEL
ALT: 11 U/L (ref 0–44)
AST: 16 U/L (ref 15–41)
Albumin: 2.7 g/dL — ABNORMAL LOW (ref 3.5–5.0)
Alkaline Phosphatase: 79 U/L (ref 38–126)
Anion gap: 7 (ref 5–15)
BUN: 21 mg/dL (ref 8–23)
CO2: 28 mmol/L (ref 22–32)
Calcium: 8.2 mg/dL — ABNORMAL LOW (ref 8.9–10.3)
Chloride: 103 mmol/L (ref 98–111)
Creatinine, Ser: 2.4 mg/dL — ABNORMAL HIGH (ref 0.44–1.00)
GFR, Estimated: 20 mL/min — ABNORMAL LOW (ref >60)
Glucose, Bld: 132 mg/dL — ABNORMAL HIGH (ref 70–99)
Potassium: 2.7 mmol/L — CL (ref 3.5–5.1)
Sodium: 138 mmol/L (ref 135–145)
Total Bilirubin: 0.6 mg/dL (ref 0.3–1.2)
Total Protein: 6.3 g/dL — ABNORMAL LOW (ref 6.5–8.1)

## 2021-12-01 LAB — RESP PANEL BY RT-PCR (FLU A&B, COVID) ARPGX2
Influenza A by PCR: NEGATIVE
Influenza B by PCR: NEGATIVE
SARS Coronavirus 2 by RT PCR: NEGATIVE

## 2021-12-01 LAB — MAGNESIUM: Magnesium: 2.2 mg/dL (ref 1.7–2.4)

## 2021-12-01 MED ORDER — MORPHINE SULFATE (PF) 2 MG/ML IV SOLN
2.0000 mg | INTRAVENOUS | Status: DC | PRN
  Administered 2021-12-01 (×3): 2 mg via INTRAVENOUS
  Filled 2021-12-01 (×3): qty 1

## 2021-12-01 MED ORDER — BIOTENE DRY MOUTH MT LIQD
15.0000 mL | OROMUCOSAL | Status: DC | PRN
  Filled 2021-12-01: qty 15

## 2021-12-01 MED ORDER — LORAZEPAM 2 MG/ML IJ SOLN: 1.0000 mg | INTRAMUSCULAR | Status: DC | PRN

## 2021-12-01 MED ORDER — GLYCOPYRROLATE 1 MG PO TABS
1.0000 mg | ORAL_TABLET | ORAL | Status: DC | PRN
  Filled 2021-12-01: qty 1

## 2021-12-01 MED ORDER — ONDANSETRON HCL 4 MG/2ML IJ SOLN: 4.0000 mg | Freq: Three times a day (TID) | INTRAMUSCULAR | Status: DC | PRN

## 2021-12-01 MED ORDER — GLYCOPYRROLATE 0.2 MG/ML IJ SOLN
0.2000 mg | INTRAMUSCULAR | Status: DC | PRN
  Filled 2021-12-01: qty 1

## 2021-12-01 MED ORDER — ACETAMINOPHEN 325 MG PO TABS: 650.0000 mg | ORAL_TABLET | Freq: Four times a day (QID) | ORAL | Status: DC | PRN

## 2021-12-01 MED ORDER — NAPHAZOLINE-GLYCERIN 0.012-0.25 % OP SOLN
1.0000 [drp] | Freq: Four times a day (QID) | OPHTHALMIC | Status: DC | PRN
Start: 2021-12-01 — End: 2021-12-02
  Filled 2021-12-01: qty 15

## 2021-12-01 MED ORDER — POLYVINYL ALCOHOL 1.4 % OP SOLN
1.0000 [drp] | Freq: Four times a day (QID) | OPHTHALMIC | Status: DC | PRN
  Filled 2021-12-01: qty 15

## 2021-12-01 MED ORDER — ACETAMINOPHEN 650 MG RE SUPP: 650.0000 mg | Freq: Four times a day (QID) | RECTAL | Status: DC | PRN

## 2021-12-01 MED ORDER — SODIUM CHLORIDE 0.9 % IV SOLN: INTRAVENOUS | Status: DC

## 2021-12-01 MED ORDER — SCOPOLAMINE 1 MG/3DAYS TD PT72
1.0000 | MEDICATED_PATCH | TRANSDERMAL | Status: DC
  Administered 2021-12-01: 10:00:00 1.5 mg via TRANSDERMAL
  Filled 2021-12-01: qty 1
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
## 2021-12-01 NOTE — ED Notes (Signed)
Breakfast tray given to patient.

## 2021-12-01 NOTE — TOC Initial Note (Signed)
Transition of Care [facility]) - Initial/Assessment Note    Patient Details  Name: Laura Foster MRN: [id number] Date of Birth: 09/19/1946  Transition of Care [facility]) CM/SW Contact:    Shelbie Hutching, RN Phone Number: 12/01/2021, 12:49 PM  Clinical Narrative:                 Patient admitted to the hospital with ureteral obstruction, history of extensive metastasized cervical cancer, no chemo.  Oncology recommended patient come to the emergency department for urology consultation and or hospice discussion.  Family is with the patient at the bedside and they and the patient have decided to go with hospice.  They are interested in residential hospice, they [facility].  RNCM reached out to [place] with [facility] - referral given and Sonia Baller will evaluate for appropriateness of hospice home.   Patient is from [place] and they did say they could take her back with hospice but only if she did not require significant care/ symptom management.  The family would like to limit the number of times the patient has to be moved from facility to facility.   Sonia Baller will be coming down to discuss with family.    Expected Discharge Plan: Hospice Medical Facility Barriers to Discharge: Other (must enter comment), Continued Medical Work up (hospice referral given to [facility])   Patient Goals and CMS Choice Patient states their goals for this hospitalization and ongoing recovery are:: Patient and family would like for patient to be hospice care.  They choose [facility] and would like to see if she qualifies for hospice home CMS Medicare.gov Compare Post Acute Care list provided to:: Patient Represenative (must comment) Choice offered to / list presented to : Adult Children  Expected Discharge Plan and Services Expected Discharge Plan: [place]   Discharge Planning Services: CM Consult Post Acute Care Choice: Residential Hospice Bed Living arrangements for the past  2 months: [place] ([place])                 DME Arranged: N/A DME Agency: NA       HH Arranged: NA [place] Agency: NA        Prior Living Arrangements/Services Living arrangements for the past 2 months: [place] ([place]) Lives with:: Facility Resident Patient language and need for interpreter reviewed:: Yes Do you feel safe going back to the place where you live?: Yes      Need for Family Participation in Patient Care: Yes (Comment) Care giver support system in place?: Yes (comment) (children) Current home services: DME (walker, oxygen) Criminal Activity/Legal Involvement Pertinent to Current Situation/Hospitalization: No - Comment as needed  Activities of Daily Living      Permission Sought/Granted Permission sought to share information with : Case Manager, Customer service manager, Family Supports Permission granted to share information with : Yes, Verbal Permission Granted  Share Information with NAME: Renita Papa  Permission granted to share info w AGENCY: [place] granted to share info w Relationship: daughter  Permission granted to share info w Contact Information: [phone]  Emotional Assessment Appearance:: Appears stated age Attitude/Demeanor/Rapport: Engaged Affect (typically observed): Accepting Orientation: : Oriented to Self, Oriented to Place Alcohol / Substance Use: Not Applicable Psych Involvement: No (comment)  Admission diagnosis:  ARF (acute renal failure) ([place]) [N17.9] Comfort measures only status [Z51.5] Patient Active Problem List   Diagnosis Date Noted   ARF (acute renal failure) ([place])  12/01/2021   Hypercholesteremia    Memory loss    Comfort measures only status    Ureteral obstruction    Hypokalemia    AKI (acute kidney injury) ([place])    Bilateral ureteral obstruction    Primary cervical squamous cell carcinoma ([place]) 11/20/2021   Goals of care,  counseling/discussion 11/20/2021   Chronic respiratory failure with hypoxia ([place]) 11/20/2021   History of GI bleed 11/15/2021   Vaginal bleeding 11/15/2021   Hyponatremia 06/03/2021   Hypoalbuminemia 06/03/2021   Macrocytic anemia 06/03/2021   Thrombocytosis 06/03/2021   Dementia ([place]) 06/03/2021   Hyperlipidemia 06/03/2021   Depression with anxiety 06/03/2021   Acute blood loss anemia    Duodenal ulcer    Acute GI bleeding 05/21/2021   Acute respiratory failure with hypoxia ([place]) 05/18/2021   COPD with acute exacerbation ([place]) 05/18/2021   Closed right hip fracture, with routine healing, subsequent encounter 05/15/2021   Prolonged QT interval 05/15/2021   COPD (chronic obstructive pulmonary disease) ([place]) 05/15/2021   Depression 05/15/2021   Memory disorder 04/24/2018   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 [place] [address] Phone: [phone] Fax: [place], [address] [place] [address] Phone: [phone] Fax: [phone]     Social Determinants of Health ([place]) Interventions    Readmission Risk Interventions Readmission Risk Prevention Plan 11/15/2021  Transportation Screening Complete  Social Work Consult for [place] Planning/Counseling Complete  Palliative Care Screening Complete  Medication Review Press photographer) Complete  Some recent data might be hidden

## 2021-12-01 NOTE — TOC Progression Note (Signed)
Transition of Care [facility]) - Progression Note    Patient Details  Name: SUMIYE HIRTH MRN: [id number] Date of Birth: [birth year]  Transition of Care [facility]) CM/SW Contact  Shelbie Hutching, RN Phone Number: 12/01/2021, 2:45 PM  Clinical Narrative:    Patient has been approved for [facility] home in [place].  They will have a bed tonight, family will need to sign consents before patient can go.     Expected Discharge Plan: Hospice Medical Facility Barriers to Discharge: Other (must enter comment), Continued Medical Work up (hospice referral given to [facility])  Expected Discharge Plan and Services Expected Discharge Plan: [place]   Discharge Planning Services: CM Consult Post Acute Care Choice: Residential Hospice Bed Living arrangements for the past 2 months: [place] ([place])                 DME Arranged: N/A DME Agency: NA       HH Arranged: NA HH Agency: NA         Social Determinants of Health (SDOH) Interventions    Readmission Risk Interventions Readmission Risk Prevention Plan 11/15/2021  Transportation Screening Complete  Social Work Consult for [place] Planning/Counseling [place] Screening Complete  Medication Review Press photographer) Complete  Some recent data might be hidden

## 2021-12-01 NOTE — ED Notes (Signed)
Patient ambulated to restroom with walker and stand by assist.
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
## 2021-12-01 NOTE — Progress Notes (Signed)
[facility]) Hospital Liaison Note  Received request from Transitions of Care Manager Doran Clay, RN for family interest in [facility]. Visited patient at bedside and spoke with daughters to confirm interest and explain services.  Approval for Hospice Home is determined by [facility] MD. Once [facility] MD has determined Hospice Home eligibility, [place] will update hospital staff and family.  Please do not hesitate to call with any hospice related questions.    Thank you for the opportunity to participate in this patient's care.   Bobbie "Loren Racer, RN, BSN [facility] Liaison [phone]

## 2021-12-01 NOTE — Discharge Instructions (Signed)
You were cared for by a hospitalist during your hospital stay. If you have any questions about your discharge medications or the care you received while you were in the hospital after you are discharged, you can call the unit and ask to speak with the hospitalist on call if the hospitalist that took care of you is not available. You will be discharged to hospice facility

## 2021-12-01 NOTE — ED Notes (Signed)
Informed RN bed assigned 

## 2021-12-01 NOTE — Progress Notes (Signed)
[place] ED[facility] [employer] [facility]) Hospital Liaison Note   Patient chart and information reviewed by [facility] physician. Hospice Home eligibility confirmed.    Hospice Home is able to offer a bed today with request for transport at 8 pm. Family agreeable to transfer today. Doran Clay, RN [facility] Manager aware.   RN please call report to [place] at [phone] prior to patient leaving the unit.  Please send signed and completed DNR with patient at discharge.   Please do not hesitate to call with any hospice related questions.    Thank you for the opportunity to participate in this patient's care.   Bobbie "Loren Racer, RN, BSN [facility] Liaison [phone]

## 2021-12-01 NOTE — Progress Notes (Signed)
Discharged to [facility] via EMS, pt calm.

## 2021-12-01 NOTE — Consult Note (Addendum)
Consultation Note Date: 12/01/2021   Patient Name: Laura Foster  DOB: Mar 18, 1946  MRN: [id number]  Age / Sex: 75 y.o., female  PCP: System, Provider Not In Referring Physician: Ivor Costa, MD  Reason for Consultation: Establishing goals of care  HPI/Patient Profile: 75 y.o. female  with past medical history of HLD, COPD, hypothyroidism, depression, anxiety, GI bleed, duodenal ulcer, vaginal bleeding, dementia, and most recently significant metastasis of cervical squamous cell cancer admitted on 12/01/2021 with abdominal discomfort and presented on advice of oncology due to abnormal PET scan.  Patient is being followed by Dr. Tasia Catchings of oncology and Dr. Baruch Gouty and radiation oncology.  Dr. Tasia Catchings recommended admission to the hospital for urology consult versus discussion of hospice.    To date patient has declined chemotherapy but has received radiation in hopes of keeping bleeding and need for blood transfusion at [place].  PET scan on 12/28 revealed rupture of collecting system to and/or renal pelvis on the left with urine and anterior pararenal space with associated urinary obstruction.  Palliative medicine team was consulted to discuss goals of care.  As per Dr. Blaine Hamper, patient's daughter/HCPOA has indicated they would like to move forward with comfort measures only.  Clinical Assessment and Goals of Care: I have reviewed medical records including EPIC notes, labs and imaging, assessed the patient and then met with patient and her daughter [name] (goes by [facility]) to discuss diagnosis prognosis, GOC, EOL wishes, disposition and options.  I introduced Palliative Medicine as specialized medical care for people living with serious illness. It focuses on providing relief from the symptoms and stress of a serious illness. The goal is to improve quality of life for both the patient and the family.  We discussed a brief life  review of the patient.  Patient worked for over 30 years and a female.  She was married up until her husband passed in February of this year.  She has 2 daughters, 1 that lives in [place] and 1 that lives in [place].  Deanne is the [employer] and a nurse.  Therapeutic space and silence as well as active listening provided to DN to share thoughts and emotions regarding her mother's current health status.  As far as functional and nutritional status patient was independent until July. As per Deanna, pt fell and broke her hip in July and has had significant cognitive and functional decline since that time.  DN recalls that patient was likely hypoxic and subsequently fell at home.  Once they recognize that she needed home oxygen and there was likely early signs of dementia she was placed in a memory care unit at [facility].  We discussed patient's current illness and what it means in the larger context of patient's on-going co-morbidities.  Natural disease trajectory and expectations at EOL were discussed. I attempted to elicit values and goals of care important to the patient.  Laura Foster has a healthy understanding the patient is in kidney failure and that her cancer would require much more aggressive treatments and surgeries.  She  does not want to put her mother through this and instead would like to focus on quality of life.  The difference between aggressive medical intervention and comfort care was considered in light of the patient's goals of care. Education offered regarding concept specific to human mortality.  Linna Hoff would like to have patient evaluated for hospice.  We reviewed hospice inpatient facility versus having patient return to [facility] with hospice as an extra layer of support.  Deanne would prefer to have her mother placed in a hospice inpatient unit rather than transfer her to [facility] only to have her again transferred to the hospice home.   TOC social worker made aware of family's wishes to  be evaluated for hospice inpatient unit.  Full comfort measures placed in chart.  Discussed with patient/family the importance of continued conversation with family and the medical providers regarding overall plan of care and treatment options, ensuring decisions are within the context of the patients values and GOCs.    Questions and concerns were addressed. The family was encouraged to call with questions or concerns.   Primary Decision Maker HCPOA  Code Status/Advance Care Planning: DNR Unrestricted visitor access Full comfort measures Discontinue any and all medications and medical interventions not focused on comfort Do not titrate supplemental oxygen above 2 L-for comfort only  Prognosis:   < 2 weeks  Discharge Planning: Hospice facility  Primary Diagnoses: Present on Admission:  ARF (acute renal failure) ([place])  Acute blood loss anemia  Depression with anxiety  COPD (chronic obstructive pulmonary disease) ([place])  Hypothyroidism  Primary cervical squamous cell carcinoma (HCC)  Vaginal bleeding  Hypercholesteremia  Ureteral obstruction  Hypokalemia  Dementia ([place])   Physical Exam Vitals and nursing note reviewed.  Constitutional:      General: She is not in acute distress.    Appearance: Normal appearance. She is normal weight. She is not toxic-appearing.  HENT:     Head: Normocephalic and atraumatic.     Mouth/Throat:     Mouth: Mucous membranes are moist.  Eyes:     Pupils: Pupils are equal, round, and reactive to light.  Cardiovascular:     Rate and Rhythm: Normal rate.     Pulses: Normal pulses.  Pulmonary:     Effort: Pulmonary effort is normal.  Abdominal:     Palpations: Abdomen is soft.  Musculoskeletal:     Comments: Generalized weakness  Skin:    General: Skin is warm and dry.  Neurological:     Mental Status: She is alert. Mental status is at baseline.  Psychiatric:        Mood and Affect: Mood normal.        Behavior: Behavior normal.     Vital Signs: BP 109/69 (BP Location: Left Arm)    Pulse 72    Temp 97.8 F (36.6 C)    Resp 16    Ht $R'5\' 3"'Qa$  (1.6 m)    Wt 77.6 kg    SpO2 93%    BMI 30.29 kg/m  Pain Scale: 0-10   Pain Score: 0-No pain SpO2: SpO2: 93 % O2 Device:SpO2: 93 % O2 Flow Rate: .O2 Flow Rate (L/min): 4 L/min  Palliative Assessment/Data: 20%     I discussed this patient's plan of care with Dr. Blaine Hamper, [facility] [place], RN Curt Bears, patient, patient's daughter.  Thank you for this consult. Palliative medicine will continue to follow and assist holistically.   Time Total: 70 minutes Greater than 50%  of this time was spent  counseling and coordinating care related to the above assessment and plan.  Signed by: Jordan Hawks, DNP, FNP-BC Palliative Medicine    Please contact Palliative Medicine Team phone at [phone] for questions and concerns.  For individual provider: See Shea Evans

## 2021-12-01 NOTE — ED Notes (Signed)
Patient moved from 18Hall to 30Hall. New oxygen tubing and oxygen tank replaced for patient. Daughter at bedside. Water given to patient.

## 2021-12-01 NOTE — TOC Transition Note (Signed)
Transition of Care [facility]) - CM/SW Discharge Note   Patient Details  Name: Laura Foster MRN: [id number] Date of Birth: 1946/07/30  [facility] CM/SW Contact:  Shelbie Hutching, RN Phone Number: 12/01/2021, 3:41 PM   Clinical Narrative:    Patient will discharge to [facility] home in [place].  [facility] transport has been called for 8 pm.  Family will be going to sign consents over at the hospice home.  Bedside RN was to call report.    Final next level of care: [place] Barriers to Discharge: Barriers Resolved   Patient Goals and CMS Choice Patient states their goals for this hospitalization and ongoing recovery are:: Patient and family would like for patient to be hospice care.  They choose [facility] and would like to see if she qualifies for hospice home CMS Medicare.gov Compare Post Acute Care list provided to:: Patient Represenative (must comment) Choice offered to / list presented to : Adult Children  Discharge Placement              Patient chooses bed at: Other - please specify in the comment section below: [facility]) Patient to be transferred to facility by: [facility] Name of family member notified: family is aware Patient and family notified of of transfer: 12/01/21  Discharge Plan and Services   Discharge Planning Services: CM Consult Post Acute Care Choice: Residential Hospice Bed          DME Arranged: N/A DME Agency: NA       HH Arranged: NA HH Agency: NA        Social Determinants of Health (SDOH) Interventions     Readmission Risk Interventions Readmission Risk Prevention Plan 11/15/2021  Transportation Screening Complete  Social Work Consult for [place] Planning/Counseling Complete  Palliative Care Screening Complete  Medication Review Press photographer) Complete  Some recent data might be hidden

## 2021-12-01 NOTE — Discharge Summary (Addendum)
Physician Discharge Summary  Laura Foster KDT:[id number] DOB: December 18, 1945 DOA: 12/01/2021  PCP: System, Provider Not In  Admit date: 12/01/2021 Discharge date: 12/01/2021  Recommendations for Outpatient Follow-up:  -pt will be discharged to [place]: None Equipment/Devices: None  Discharge Condition: No significant change, need hospice care CODE STATUS: DNR Diet recommendation: Regular diet  Brief/Interim Summary (HPI)   Laura Foster is a 75 y.o. female with medical history significant of metastasized cervical cancer, hyperlipidemia, COPD, hypothyroidism, depression, anxiety, dementia, GI bleeding, duodenal ulcer, vaginal bleeding, who presents with abnormal PET scan, abdominal discomfort.   Patient was recently diagnosed with metastasized cervical squamous cell cancer.  Patient is followed up with oncologist, Dr. Tasia Catchings and Dr. Baruch Gouty with radiation oncology.  Patient declined chemotherapy. She is getting radiation therapy.  She was recently hospitalized due to vaginal bleeding and required blood transfusion.  She states she has some diffuse abdominal discomfort, no significant pain.  Denies nausea, vomiting or diarrhea.  No fever or chills.  Patient does not have chest pain, cough, shortness breath.  No symptoms of UTI.  Patient had PET scan done on 11/29/2021, which showed rupture of the collecting systems and/or renal pelvis with associated urinary obstruction due to advanced cervical cancer. Oncologist recommended patient be evaluated in the emergency department for admission and urology consultation versus hospice.     PET scan on 11/29/2021: Findings of locally advanced cervical neoplasm with involvement of the bladder base, parametrium and uterus leading to bilateral ureteral obstruction.   Rupture of collecting systems and or renal pelvis on the LEFT associated with urine in the anterior pararenal space. Urologic consultation is suggested.    Limited excretion of FDG into the urinary bladder even at greater than 1 hour. This further supports the substantial degree of urinary tract obstruction.   No signs of distant metastatic disease at this time   Aortic Atherosclerosis (ICD10-I70.0).   ED Course: pt was found to have hemoglobin 9.0 (9.5 on 11/20/2021), negative COVID PCR, AKI with creatinine 2.40, BUN 21 (baseline creatinine 0.8 on 11/14/2021), potassium 2.7, magnesium 2.2, INR 1.3, temperature normal, blood pressure 109/69, heart rate 87, RR 22, oxygen saturation 93% on room air.  Patient is admitted to [facility] for comfort care.    Subjective  - has abdominal discomfort  Discharge Diagnoses and Hospital Course:   Principal Problem:   Comfort measures only status Active Problems:   COPD (chronic obstructive pulmonary disease) (HCC)   Acute blood loss anemia   Hypothyroidism   Dementia (HCC)   Depression with anxiety   Vaginal bleeding   Primary cervical squamous cell carcinoma (HCC)   ARF (acute renal failure) (HCC)   Hypercholesteremia   Ureteral obstruction   Hypokalemia   Comfort measures only status: Patient has multiple chronic comorbidities, including advanced end-stage of cervical cancer. Her prognosis is extremely poor. We have had extensive discussion with her daughter who is the power of attorney and her sister. Patient's family members were very supportive. They want to have comfort care now, which is reasonable. Pt is DNR. Pt was seen by hospice team, and bed is available in hospice facility tonight.  Patient will be transported to hospice facility for hospice care tonight   -pt was treated in hospital as follows:  -Stopped drawing labs and IVF -prn LORazepam for agitation - prn morphine for pain -Naphazoline 0.1 % ophthalmic solution -Robinul -scopolamine patch -Psycho/Social: emotional support offered to patient and family   Other active medical issues  are listed as below: will focus on  comfort care now.  Will not continue home medications.   COPD (chronic obstructive pulmonary disease) (HCC)   Acute blood loss anemia   Hypothyroidism   Dementia (HCC)   Depression with anxiety   Vaginal bleeding   Primary cervical squamous cell carcinoma (HCC)   ARF (acute renal failure) (HCC)   Hypercholesteremia   Ureteral obstruction   Hypokalemia    Discharge Instructions -Pt will be transferred to hospice facility tonight Discharge Instructions     Diet general   Complete by: As directed       Allergies as of 12/01/2021       Reactions   Penicillins Rash        Medication List     STOP taking these medications    acetaminophen 500 MG tablet Commonly known as: TYLENOL   albuterol 108 (90 Base) MCG/ACT inhaler Commonly known as: VENTOLIN HFA   ALPRAZolam 0.5 MG tablet Commonly known as: XANAX   atorvastatin 10 MG tablet Commonly known as: LIPITOR   donepezil 5 MG tablet Commonly known as: ARICEPT   ipratropium-albuterol 0.5-2.5 (3) MG/3ML Soln Commonly known as: DUONEB   levothyroxine 75 MCG tablet Commonly known as: SYNTHROID   melatonin 5 MG Tabs   memantine 10 MG tablet Commonly known as: NAMENDA   multivitamin with minerals tablet   pantoprazole 40 MG tablet Commonly known as: Protonix   Preparation H 0.25-14-74.9 % rectal ointment Generic drug: phenylephrine-shark liver oil-mineral oil-petrolatum   Spiriva Respimat 2.5 MCG/ACT Aers Generic drug: Tiotropium Bromide Monohydrate   sucralfate 1 g tablet Commonly known as: CARAFATE   umeclidinium-vilanterol 62.5-25 MCG/INH Aepb Commonly known as: ANORO ELLIPTA   Vitamin D3 25 MCG (1000 UT) Caps        Allergies  Allergen Reactions   Penicillins Rash    Consultations: Palliative care and hospice care   Procedures/Studies: CT CHEST ABDOMEN PELVIS W CONTRAST  Result Date: 11/16/2021 CLINICAL DATA:  75 year old female with history of uterine/cervical cancer. Staging  examination. Vaginal bleeding. EXAM: CT CHEST, ABDOMEN, AND PELVIS WITH CONTRAST TECHNIQUE: Multidetector CT imaging of the chest, abdomen and pelvis was performed following the standard protocol during bolus administration of intravenous contrast. CONTRAST:  155mL OMNIPAQUE IOHEXOL 300 MG/ML  SOLN COMPARISON:  No prior.  Pelvic ultrasound 11/15/2021. FINDINGS: CT CHEST FINDINGS Cardiovascular: Heart size is normal. There is no significant pericardial fluid, thickening or pericardial calcification. There is aortic atherosclerosis, as well as atherosclerosis of the great vessels of the mediastinum and the coronary arteries, including calcified atherosclerotic plaque in the left main, left anterior descending, left circumflex and right coronary arteries. There is also severe narrowing of the ostial and proximal portions of the left subclavian artery. Calcifications of the aortic valve. Calcifications of the mitral annulus. Lipomatous hypertrophy of the interatrial septum (normal anatomical variant) incidentally noted. Mediastinum/Nodes: No pathologically enlarged mediastinal or hilar lymph nodes. Esophagus is unremarkable in appearance. No axillary lymphadenopathy. Lungs/Pleura: Numerous tiny 2-4 mm pulmonary nodules scattered throughout the lungs bilaterally. These are nonspecific. No larger more suspicious appearing pulmonary nodules or masses are noted. Scattered areas of linear scarring are noted throughout the lungs bilaterally. No acute consolidative airspace disease. No pleural effusions. Mild diffuse bronchial wall thickening with mild centrilobular and paraseptal emphysema. Musculoskeletal: There are no aggressive appearing lytic or blastic lesions noted in the visualized portions of the skeleton. CT ABDOMEN PELVIS FINDINGS Hepatobiliary: Several scattered small low-attenuation lesions are noted throughout the liver, many of which  are too small to definitively characterize, but favored to represent tiny  cysts. The largest of these low-attenuation lesions are compatible with simple cysts, measuring up to 1.8 cm in segment 2. In addition, there is an ill-defined hypovascular area in segment 4A/4B of the liver adjacent to the falciform ligament, which is incompletely characterized, but potentially an area of focal fatty infiltration or perfusion anomaly. No other larger more suspicious appearing hepatic lesions are noted. Irregularity of the fundus of the gallbladder could suggest adenomyomatosis. Calcified gallstone lying dependently in the gallbladder measuring 1.8 cm in diameter. No pericholecystic fluid or inflammatory changes to suggest an acute cholecystitis at this time. Pancreas: No pancreatic mass. No pancreatic ductal dilatation. No pancreatic or peripancreatic fluid collections or inflammatory changes. Spleen: Unremarkable. Adrenals/Urinary Tract: Mild bilateral hydroureteronephrosis. The appearance of the kidneys and bilateral adrenal glands is otherwise normal. Foley balloon catheter present within the lumen of the urinary bladder which is decompressed. However, there is some asymmetric mass-like thickening of the posterior wall of the urinary bladder which is estimated to measure approximately 7.2 x 2.1 x 4.2 cm (axial image 110 of series 2 and sagittal image 96 of series 6). This thickening of the urinary bladder wall encompasses the ureterovesicular junctions bilaterally. Some of this abnormal soft tissue thickening and enhancement appears to extend proximally into the distal third of the left ureter as well best appreciated on axial images 111 and 110 of series 2. Stomach/Bowel: The appearance of the stomach is normal. There is no pathologic dilatation of small bowel or colon. Numerous colonic diverticulae are noted, particularly in the sigmoid colon, without surrounding inflammatory changes to clearly indicate an acute diverticulitis at this time. Normal appendix. Vascular/Lymphatic: Aortic  atherosclerosis, without evidence of aneurysm or dissection in the abdominal or pelvic vasculature. Reproductive: Mass-like thickening of the cervix, upper vagina, lower uterine segment and portions of the uterine body best appreciated on axial image 107 of series 2 and sagittal image 96 of series 6 estimated to measure approximately 3.7 x 7.3 x 4.6 cm, concerning for neoplasm. This is intimately associated with the abnormal soft tissue along the posterior wall of the urinary bladder. Left ovary is unremarkable in appearance. In the right ovary there is a 4.1 x 3.6 x 4.2 cm low-intermediate attenuation lesion (axial image 103 of series 2 and coronal image 91 of series 5), previously characterized as a simple cyst on recent pelvic ultrasound. Other: Small bilateral inguinal hernias containing fat on the right and a short segment of mid to distal small bowel on the left. No significant volume of ascites. No pneumoperitoneum. Musculoskeletal: There are no aggressive appearing lytic or blastic lesions noted in the visualized portions of the skeleton. IMPRESSION: 1. Findings are highly concerning for locally advanced cervical cancer with potential invasion into the upper vagina, lower uterus and posterior aspect of the urinary bladder. This is associated with mild bilateral hydroureteronephrosis, suggesting involvement of the ureterovesicular junctions bilaterally, as well as the distal third of the left ureter (discussed above). 2. Multiple tiny pulmonary nodules scattered throughout the lungs bilaterally, nonspecific. Metastatic disease is not excluded, and accordingly, close attention on follow-up studies is recommended. 3. Indeterminate lesion in the liver adjacent to the falciform ligament. This is favored to represent a focal area of fatty infiltration or a benign perfusion anomaly, however, further evaluation with nonemergent abdominal MRI with and without IV gadolinium could be considered to provide definitive  characterization and exclude metastatic disease. 4. Small bilateral inguinal hernias containing fat on  the right and a short segment of mid to distal small bowel on the left (without associated bowel incarceration or obstruction at this time). 5. Colonic diverticulosis without evidence of acute diverticulitis at this time. 6. Cholelithiasis. Irregularity of the fundus of the gallbladder suggestive of adenomyomatosis. 7. Aortic atherosclerosis, in addition to left main and 3 vessel coronary artery disease. Assessment for potential risk factor modification, dietary therapy or pharmacologic therapy may be warranted, if clinically indicated. 8. There are calcifications of the aortic valve and mitral annulus. Echocardiographic correlation for evaluation of potential valvular dysfunction may be warranted if clinically indicated. Electronically Signed   By: Vinnie Langton M.D.   On: 11/16/2021 12:43   NM PET Image Initial (PI) Skull Base To Thigh  Result Date: 11/30/2021 CLINICAL DATA:  Initial treatment strategy for uterine/cervical cancer. EXAM: NUCLEAR MEDICINE PET SKULL BASE TO THIGH TECHNIQUE: 9.54 mCi F-18 FDG was injected intravenously. Full-ring PET imaging was performed from the skull base to thigh after the radiotracer. CT data was obtained and used for attenuation correction and anatomic localization. Fasting blood glucose: 83 mg/dl COMPARISON:  CT of the chest, abdomen and pelvis of December 15 of 2022. FINDINGS: Mediastinal blood pool activity: SUV max 2.64 Liver activity: SUV max NA NECK: No hypermetabolic lymph nodes in the neck. Incidental CT findings: none CHEST: No hypermetabolic mediastinal or hilar nodes. No suspicious pulmonary nodules on the CT scan. Incidental CT findings: Atherosclerotic changes of the thoracic aorta with calcification. Normal heart size. No pericardial effusion. Three-vessel coronary artery disease. Normal non-contrast appearance of the central pulmonary vasculature. Limited  assessment of cardiovascular structures given lack of intravenous contrast. Esophagus grossly normal. No adenopathy by size criteria in the chest. Basilar atelectasis. Airways are patent. Areas of parenchymal scarring in the chest with similar appearance to prior imaging in the mid chest bilaterally ABDOMEN/PELVIS: Interval development of fluid anterior to the LEFT kidney along the anterior pararenal space with stranding about the LEFT renal pelvis in the setting of urinary tract obstruction. This fluid is hypermetabolic and shows similar FDG concentration as adjacent urine. No signs of nodal or visceral metastases in the abdomen. Extensive increased metabolic activity involving the cervix, bladder base and parametrium. Increased metabolic activity extends into the uterus maximum SUV in the region of the cervical mass (image 225/3) 8.0. Thickening of the posterior urinary bladder with increased metabolic activity tracking along the RIGHT and LEFT urinary bladder RIGHT greater than LEFT (image 221/3) maximum SUV of 11.1, it is difficult to determine whether some of this could be related to urine though there is no sign of urine filling the urinary bladder. Concentrated FDG is noted within the adjacent distal RIGHT ureter. FDG excretion only to the level of the proximal ureter on the LEFT. Incidental CT findings: Hepatic cyst in the LEFT hepatic lobe. Cholelithiasis without acute gallbladder process. Pancreas, spleen and adrenal glands are unremarkable. Bilateral hydronephrosis with suspected rupture of the LEFT renal pelvis or intrarenal collecting systems in the adjacent pararenal space. No acute gastrointestinal process. Normal appendix. Stool throughout the colon. SKELETON: No focal hypermetabolic activity to suggest skeletal metastasis. Incidental CT findings: No acute bone finding. No destructive bone process. Spinal degenerative changes. Signs of bilateral femoral ORIF, partially imaged. IMPRESSION: Findings of  locally advanced cervical neoplasm with involvement of the bladder base, parametrium and uterus leading to bilateral ureteral obstruction. Rupture of collecting systems and or renal pelvis on the LEFT associated with urine in the anterior pararenal space. Urologic consultation is suggested. Limited  excretion of FDG into the urinary bladder even at greater than 1 hour. This further supports the substantial degree of urinary tract obstruction. No signs of distant metastatic disease at this time Aortic Atherosclerosis (ICD10-I70.0). These results will be called to the ordering clinician or representative by the Radiologist Assistant, and communication documented in the PACS or [employer]. Electronically Signed   By: Zetta Bills M.D.   On: 11/30/2021 14:06   US PELVIC COMPLETE WITH TRANSVAGINAL  Result Date: 11/15/2021 CLINICAL DATA:  Postmenopausal bleeding x2 days EXAM: TRANSABDOMINAL AND TRANSVAGINAL ULTRASOUND OF PELVIS TECHNIQUE: Both transabdominal and transvaginal ultrasound examinations of the pelvis were performed. Transabdominal technique was performed for global imaging of the pelvis including uterus, ovaries, adnexal regions, and pelvic cul-de-sac. It was necessary to proceed with endovaginal exam following the transabdominal exam to visualize the endometrium. COMPARISON:  None FINDINGS: Uterus Measurements: 8.1 x 4.2 x 4.2 cm = volume: 75 mL. Possible 2.3 x 2.7 x 2.0 cm cervical mass with vascularity, equivocal. Endometrium Thickness: 7 mm.  No focal abnormality visualized. Right ovary Measurements: 6.3 x 4.0 x 5.2 cm = volume: 68 mL. 3.5 x 3.8 x 4.2 cm simple cyst. Left ovary Not discretely visualized.  No adnexal mass is seen. Other findings No abnormal free fluid. IMPRESSION: Endometrial complex measures 7 mm. In the setting of post-menopausal bleeding, endometrial sampling is indicated to exclude carcinoma. If results are benign, sonohysterogram should be considered for focal lesion  work-up. (Ref: Radiological Reasoning: Algorithmic Workup of Abnormal Vaginal Bleeding with Endovaginal Sonography and Sonohysterography. AJR 2008; 449:Q75-91) Possible 2.7 cm cervical mass, equivocal. Visual inspection is suggested. 4.2 cm simple ovarian cyst. Recommend follow-up pelvic US in 3-6 months. Reference: Radiology 2019 Nov;293(2):359-371 Electronically Signed   By: Julian Hy M.D.   On: 11/15/2021 00:10      Discharge Exam: Vitals:   12/01/21 0643 12/01/21 1533  BP: 109/69 (!) 108/57  Pulse: 72 67  Resp: 16 18  Temp:  98.6 F (37 C)  SpO2: 93% 100%   Vitals:   12/01/21 0519 12/01/21 0643 12/01/21 0824 12/01/21 1533  BP:  109/69  (!) 108/57  Pulse:  72  67  Resp:  16  18  Temp:    98.6 F (37 C)  TempSrc:    Oral  SpO2: 93% 93%  100%  Weight:   77.6 kg   Height:   5\' 3"  (1.6 m) 5\' 3"  (1.6 m)   General: Not in acute distress HEENT:       Eyes: PERRL, EOMI, no scleral icterus.       ENT: No discharge from the ears and nose, no pharynx injection, no tonsillar enlargement.        Neck: No JVD, no bruit, no mass felt. Heme: No neck lymph node enlargement. Cardiac: S1/S2, RRR, No murmurs, No gallops or rubs. Respiratory: No rales, wheezing, rhonchi or rubs. GI: Soft, distended, has mild diffuse tenderness, no rebound pain, no organomegaly, BS present. GU: No hematuria Ext: No pitting leg edema bilaterally. 1+DP/PT pulse bilaterally. Musculoskeletal: No joint deformities, No joint redness or warmth, no limitation of ROM in spin. Skin: No rashes.  Neuro: Alert, oriented X3, cranial nerves II-XII grossly intact, moves all extremities normally.  Psych: Patient is not psychotic, no suicidal or hemocidal ideation.     The results of significant diagnostics from this hospitalization (including imaging, microbiology, ancillary and laboratory) are listed below for reference.     Microbiology: Recent Results (from the past 240 hour(s))  Resp Panel by RT-PCR (Flu  A&B, Covid) Nasopharyngeal Swab     Status: None   Collection Time: 12/01/21  2:14 AM   Specimen: Nasopharyngeal Swab; Nasopharyngeal(NP) swabs in vial transport medium  Result Value Ref Range Status   SARS Coronavirus 2 by RT PCR NEGATIVE NEGATIVE Final    Comment: (NOTE) SARS-CoV-2 target nucleic acids are NOT DETECTED.  The SARS-CoV-2 RNA is generally detectable in upper respiratory specimens during the acute phase of infection. The lowest concentration of SARS-CoV-2 viral copies this assay can detect is 138 copies/mL. A negative result does not preclude SARS-Cov-2 infection and should not be used as the sole basis for treatment or other patient management decisions. A negative result may occur with  improper specimen collection/handling, submission of specimen other than nasopharyngeal swab, presence of viral mutation(s) within the areas targeted by this assay, and inadequate number of viral copies(<138 copies/mL). A negative result must be combined with clinical observations, patient history, and epidemiological information. The expected result is Negative.  Fact Sheet for Patients:  EntrepreneurPulse.com.au  Fact Sheet for Healthcare Providers:  IncredibleEmployment.be  This test is no t yet approved or cleared by the [place] FDA and  has been authorized for detection and/or diagnosis of SARS-CoV-2 by FDA under an Emergency Use Authorization (EUA). This EUA will remain  in effect (meaning this test can be used) for the duration of the COVID-19 declaration under Section 564(b)(1) of the Act, 21 U.S.C.section 360bbb-3(b)(1), unless the authorization is terminated  or revoked sooner.       Influenza A by PCR NEGATIVE NEGATIVE Final   Influenza B by PCR NEGATIVE NEGATIVE Final    Comment: (NOTE) The Xpert Xpress SARS-CoV-2/FLU/RSV plus assay is intended as an aid in the diagnosis of influenza from Nasopharyngeal swab specimens  and should not be used as a sole basis for treatment. Nasal washings and aspirates are unacceptable for Xpert Xpress SARS-CoV-2/FLU/RSV testing.  Fact Sheet for Patients: EntrepreneurPulse.com.au  Fact Sheet for Healthcare Providers: IncredibleEmployment.be  This test is not yet approved or cleared by the [place] FDA and has been authorized for detection and/or diagnosis of SARS-CoV-2 by FDA under an Emergency Use Authorization (EUA). This EUA will remain in effect (meaning this test can be used) for the duration of the COVID-19 declaration under Section 564(b)(1) of the Act, 21 U.S.C. section 360bbb-3(b)(1), unless the authorization is terminated or revoked.  Performed at [facility], [address] [place], [place] [address]      Labs: BNP (last 3 results) Recent Labs    05/21/21 0902  BNP 18.2   Basic Metabolic Panel: Recent Labs  Lab 12/01/21 0214  NA 138  K 2.7*  CL 103  CO2 28  GLUCOSE 132*  BUN 21  CREATININE 2.40*  CALCIUM 8.2*  MG 2.2   Liver Function Tests: Recent Labs  Lab 12/01/21 0214  AST 16  ALT 11  ALKPHOS 79  BILITOT 0.6  PROT 6.3*  ALBUMIN 2.7*   No results for input(s): LIPASE, AMYLASE in the last 168 hours. No results for input(s): AMMONIA in the last 168 hours. CBC: Recent Labs  Lab 12/01/21 0214  WBC 10.5  NEUTROABS 9.2*  HGB 9.0*  HCT 28.1*  MCV 94.9  PLT 362   Cardiac Enzymes: No results for input(s): CKTOTAL, CKMB, CKMBINDEX, TROPONINI in the last 168 hours. BNP: Invalid input(s): POCBNP CBG: Recent Labs  Lab 11/29/21 1159  GLUCAP 83   D-Dimer No results for input(s): DDIMER in the  last 72 hours. Hgb A1c No results for input(s): HGBA1C in the last 72 hours. Lipid Profile No results for input(s): CHOL, HDL, LDLCALC, TRIG, CHOLHDL, LDLDIRECT in the last 72 hours. Thyroid function studies No results for input(s): TSH, T4TOTAL, T3FREE, THYROIDAB in the last  72 hours.  Invalid input(s): FREET3 Anemia work up No results for input(s): VITAMINB12, FOLATE, FERRITIN, TIBC, IRON, RETICCTPCT in the last 72 hours. Urinalysis    Component Value Date/Time   COLORURINE YELLOW 11/14/2021 1250   APPEARANCEUR CLEAR 11/14/2021 1250   LABSPEC 1.020 11/14/2021 1250   PHURINE 5.5 11/14/2021 1250   GLUCOSEU NEGATIVE 11/14/2021 1250   HGBUR NEGATIVE 11/14/2021 1250   BILIRUBINUR NEGATIVE 11/14/2021 1250   KETONESUR NEGATIVE 11/14/2021 1250   PROTEINUR TRACE (A) 11/14/2021 1250   NITRITE NEGATIVE 11/14/2021 1250   LEUKOCYTESUR NEGATIVE 11/14/2021 1250   Sepsis Labs Invalid input(s): PROCALCITONIN,  WBC,  LACTICIDVEN Microbiology Recent Results (from the past 240 hour(s))  Resp Panel by RT-PCR (Flu A&B, Covid) Nasopharyngeal Swab     Status: None   Collection Time: 12/01/21  2:14 AM   Specimen: Nasopharyngeal Swab; Nasopharyngeal(NP) swabs in vial transport medium  Result Value Ref Range Status   SARS Coronavirus 2 by RT PCR NEGATIVE NEGATIVE Final    Comment: (NOTE) SARS-CoV-2 target nucleic acids are NOT DETECTED.  The SARS-CoV-2 RNA is generally detectable in upper respiratory specimens during the acute phase of infection. The lowest concentration of SARS-CoV-2 viral copies this assay can detect is 138 copies/mL. A negative result does not preclude SARS-Cov-2 infection and should not be used as the sole basis for treatment or other patient management decisions. A negative result may occur with  improper specimen collection/handling, submission of specimen other than nasopharyngeal swab, presence of viral mutation(s) within the areas targeted by this assay, and inadequate number of viral copies(<138 copies/mL). A negative result must be combined with clinical observations, patient history, and epidemiological information. The expected result is Negative.  Fact Sheet for Patients:  EntrepreneurPulse.com.au  Fact Sheet for  Healthcare Providers:  IncredibleEmployment.be  This test is no t yet approved or cleared by the [place] FDA and  has been authorized for detection and/or diagnosis of SARS-CoV-2 by FDA under an Emergency Use Authorization (EUA). This EUA will remain  in effect (meaning this test can be used) for the duration of the COVID-19 declaration under Section 564(b)(1) of the Act, 21 U.S.C.section 360bbb-3(b)(1), unless the authorization is terminated  or revoked sooner.       Influenza A by PCR NEGATIVE NEGATIVE Final   Influenza B by PCR NEGATIVE NEGATIVE Final    Comment: (NOTE) The Xpert Xpress SARS-CoV-2/FLU/RSV plus assay is intended as an aid in the diagnosis of influenza from Nasopharyngeal swab specimens and should not be used as a sole basis for treatment. Nasal washings and aspirates are unacceptable for Xpert Xpress SARS-CoV-2/FLU/RSV testing.  Fact Sheet for Patients: EntrepreneurPulse.com.au  Fact Sheet for Healthcare Providers: IncredibleEmployment.be  This test is not yet approved or cleared by the [place] FDA and has been authorized for detection and/or diagnosis of SARS-CoV-2 by FDA under an Emergency Use Authorization (EUA). This EUA will remain in effect (meaning this test can be used) for the duration of the COVID-19 declaration under Section 564(b)(1) of the Act, 21 U.S.C. section 360bbb-3(b)(1), unless the authorization is terminated or revoked.  Performed at [facility], [address], [place], [place] [address]     Time coordinating discharge:  28 minutes.  SIGNED:  Ivor Costa, MD [facility] 12/01/2021, 4:11 PM   If 7PM-7AM, please contact night-coverage [email or web address]

## 2021-12-02 LAB — TYPE AND SCREEN
ABO/RH(D): O POS
Antibody Screen: NEGATIVE

## 2021-12-05 ENCOUNTER — Ambulatory Visit: Payer: Medicare Other

## 2021-12-06 ENCOUNTER — Inpatient Hospital Stay: Payer: Medicare Other

## 2021-12-06 ENCOUNTER — Ambulatory Visit: Payer: Medicare Other

## 2021-12-06 ENCOUNTER — Inpatient Hospital Stay: Payer: Medicare Other | Admitting: Hospice and Palliative Medicine

## 2021-12-07 ENCOUNTER — Ambulatory Visit: Payer: Medicare Other

## 2021-12-08 ENCOUNTER — Ambulatory Visit: Payer: Medicare Other

## 2021-12-11 ENCOUNTER — Ambulatory Visit: Payer: Medicare Other

## 2021-12-12 ENCOUNTER — Ambulatory Visit: Payer: Medicare Other

## 2021-12-12 DIAGNOSIS — J449 Chronic obstructive pulmonary disease, unspecified: Secondary | ICD-10-CM | POA: Diagnosis not present

## 2021-12-13 ENCOUNTER — Ambulatory Visit: Payer: Medicare Other

## 2021-12-14 ENCOUNTER — Ambulatory Visit: Payer: Medicare Other

## 2021-12-15 ENCOUNTER — Ambulatory Visit: Payer: Medicare Other

## 2021-12-18 ENCOUNTER — Ambulatory Visit: Payer: Medicare Other

## 2021-12-19 ENCOUNTER — Ambulatory Visit: Payer: Medicare Other

## 2021-12-20 ENCOUNTER — Ambulatory Visit: Payer: Medicare Other

## 2021-12-21 ENCOUNTER — Ambulatory Visit: Payer: Medicare Other

## 2021-12-22 ENCOUNTER — Ambulatory Visit: Payer: Medicare Other

## 2021-12-25 ENCOUNTER — Ambulatory Visit: Payer: Medicare Other

## 2021-12-26 ENCOUNTER — Ambulatory Visit: Payer: Medicare Other

## 2021-12-27 ENCOUNTER — Ambulatory Visit: Payer: Medicare Other

## 2021-12-28 ENCOUNTER — Ambulatory Visit: Payer: Medicare Other

## 2022-01-03 DEATH — deceased

## 2022-10-30 IMAGING — DX DG PELVIS 1-2V
1 series · 1 of 1 positions shown · non-contrast
Comparison: None.

CLINICAL DATA: Recent fall with pelvic pain on the right, initial
encounter

EXAM:
PELVIS - 1 VIEW

[pelvis ap]
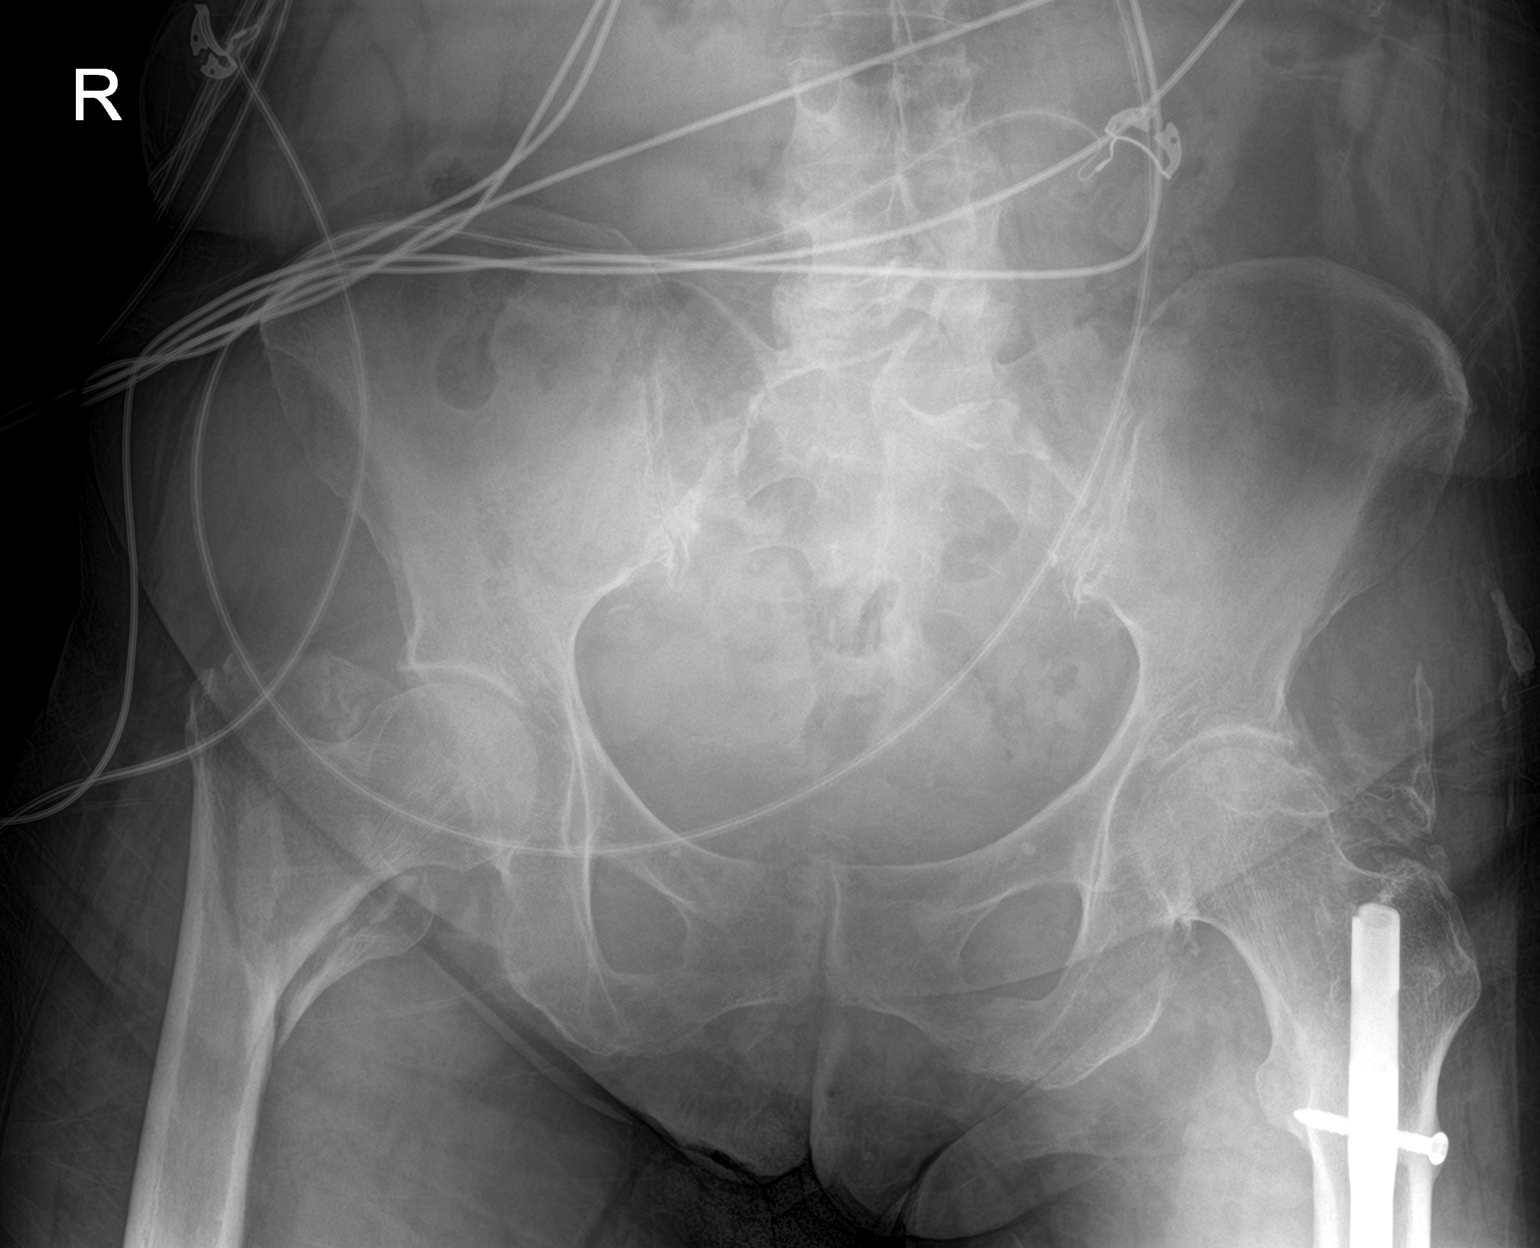

[1 of 1 positions shown; findings below may reference images not displayed]

FINDINGS: Comminuted right intratrochanteric fracture is noted with mild
impaction at the fracture site. Medullary rod is noted within the
proximal left femur. Pelvic ring appears intact. No soft tissue
abnormality is noted.
IMPRESSION: Right intratrochanteric femoral fracture.

## 2022-10-31 IMAGING — DX DG PELVIS 1-2V
1 series · 1 of 1 positions shown · non-contrast
Comparison: Portable exam 5495 hours compared to earlier
intraoperative images of 05/16/2021

CLINICAL DATA: Post IM nail RIGHT femur

EXAM:
PELVIS - 1-2 VIEW

[pelvis ap]
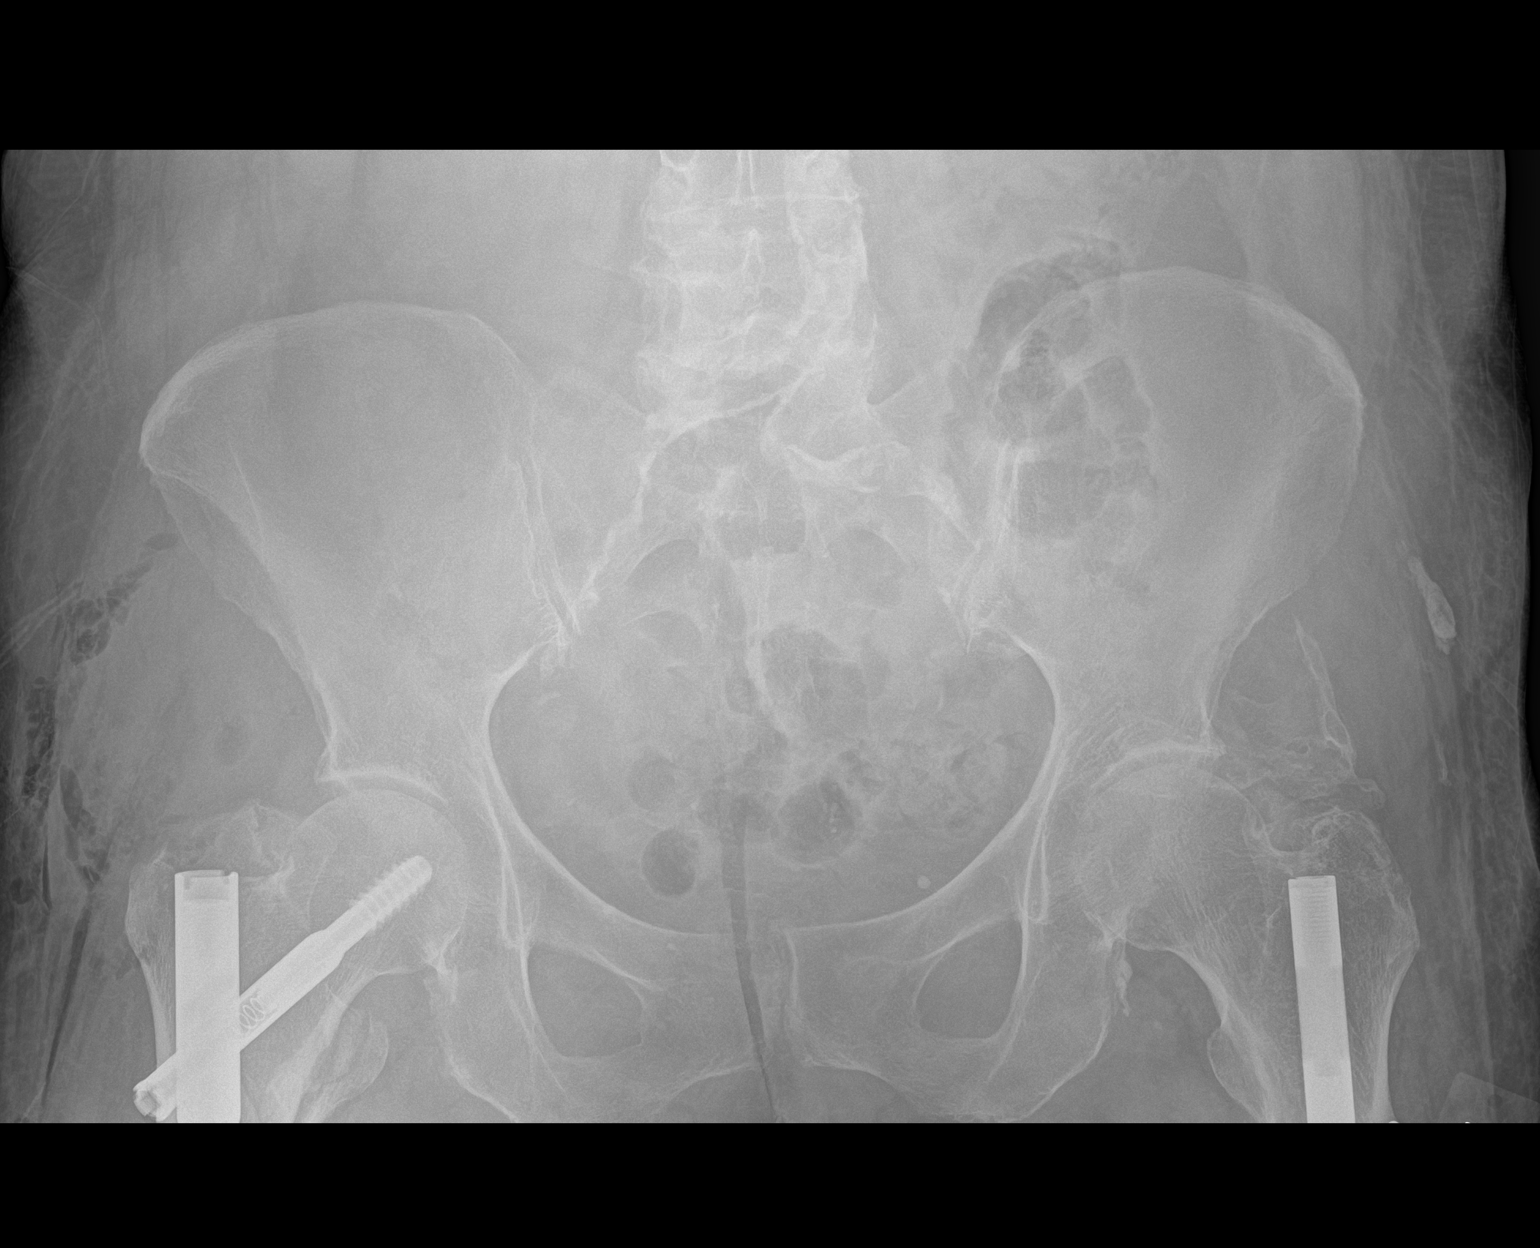

[1 of 1 positions shown; findings below may reference images not displayed]

FINDINGS: IM nail with compression screw at RIGHT femur across a reduced
intertrochanteric fracture.

Old IM nail with locking screw at proximal LEFT femur.

Bones demineralized.

Hip and SI joint spaces preserved.

No additional fracture or dislocation seen.

Atherosclerotic calcifications aorta and iliac arteries.
IMPRESSION: Post nailing of intertrochanteric fracture RIGHT femur.

Aortic Atherosclerosis ([id number]).

## 2022-10-31 IMAGING — DX DG FEMUR 2+V*R*
4 series · 4 of 4 positions shown · non-contrast
Comparison: Portable exam 7351 hours compared intraoperative images
of 05/16/2021

CLINICAL DATA: Post RIGHT femoral nail placement

EXAM:
RIGHT FEMUR 2 VIEWS

[femur ap proximal (1 of 2)]
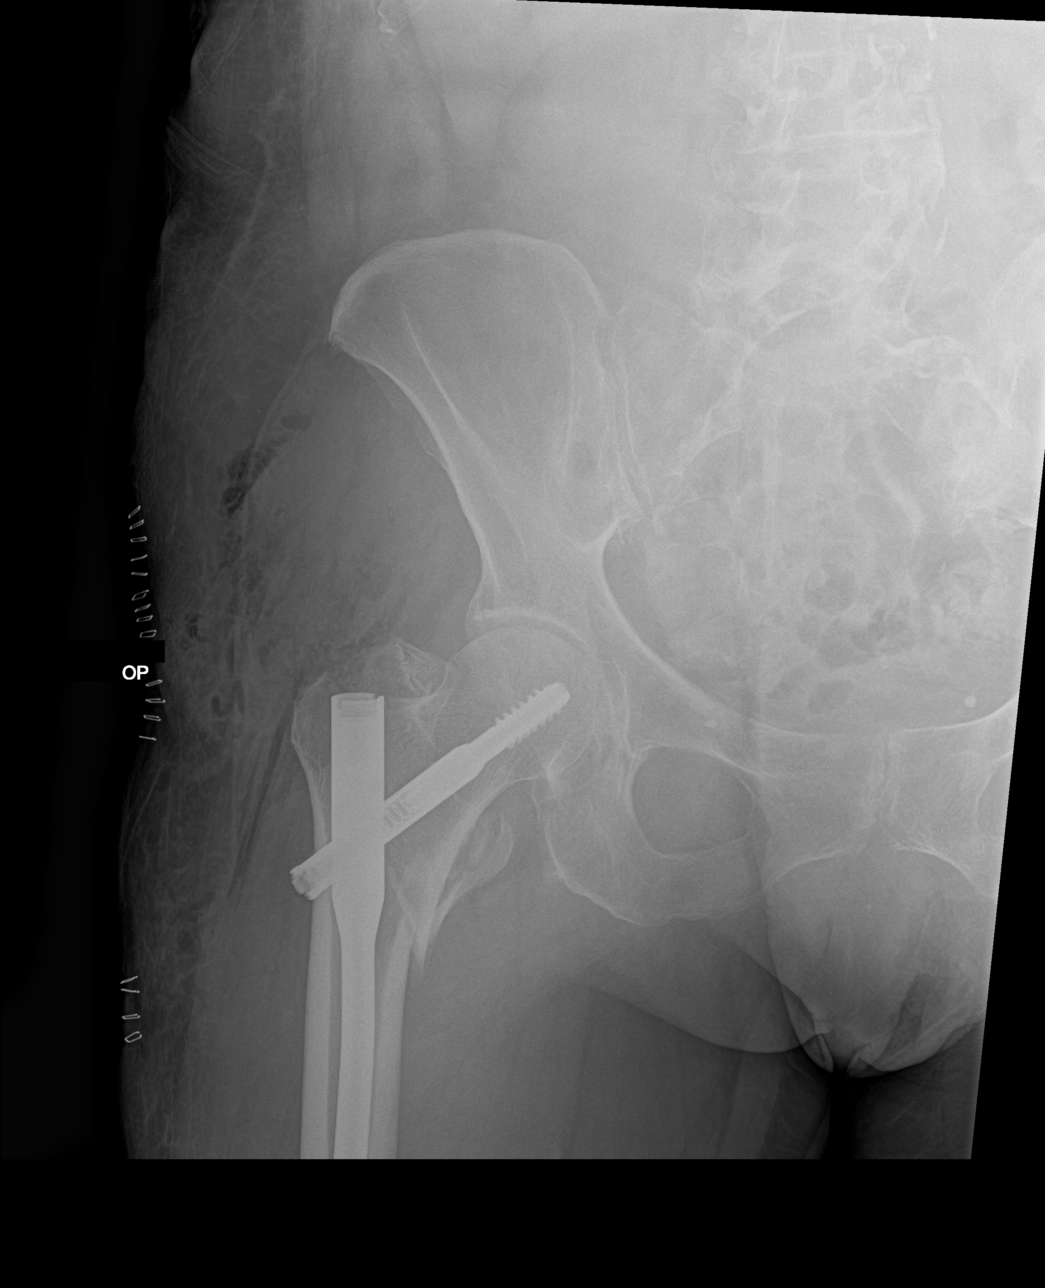

[femur ap proximal (2 of 2)]
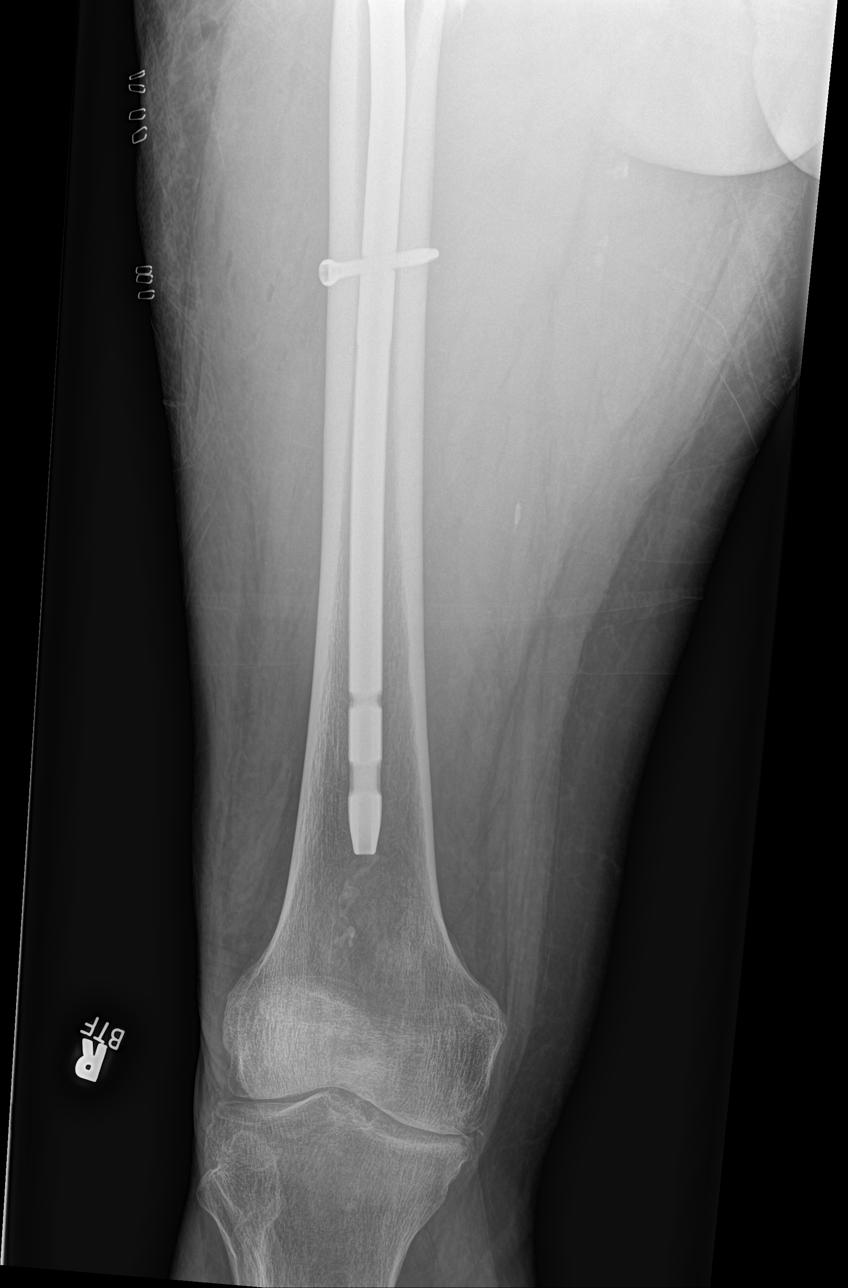

[femur lat distal (1 of 2)]
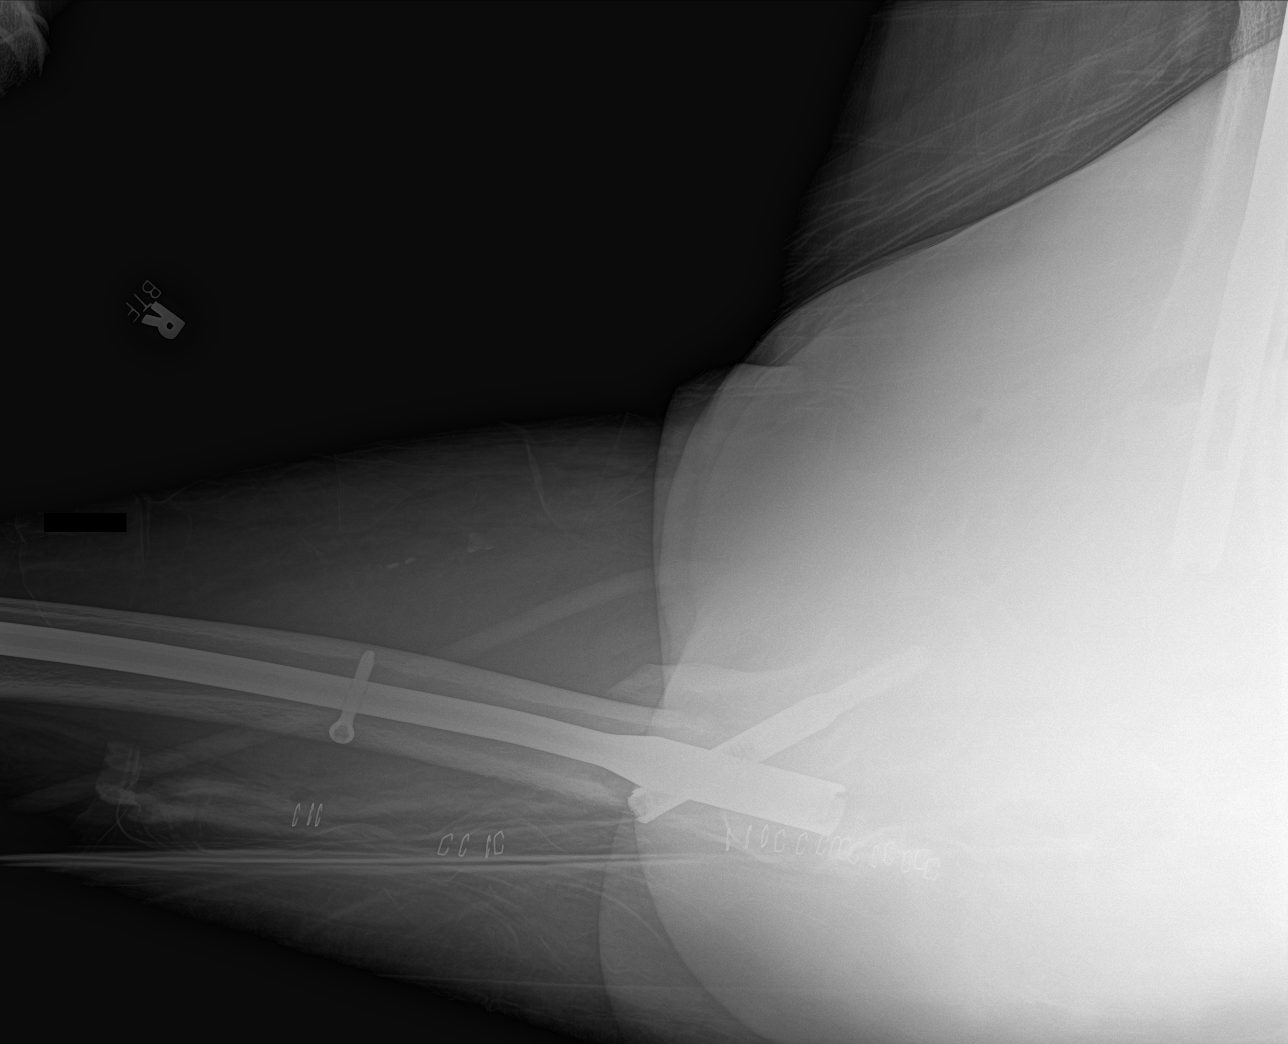

[femur lat distal (2 of 2)]
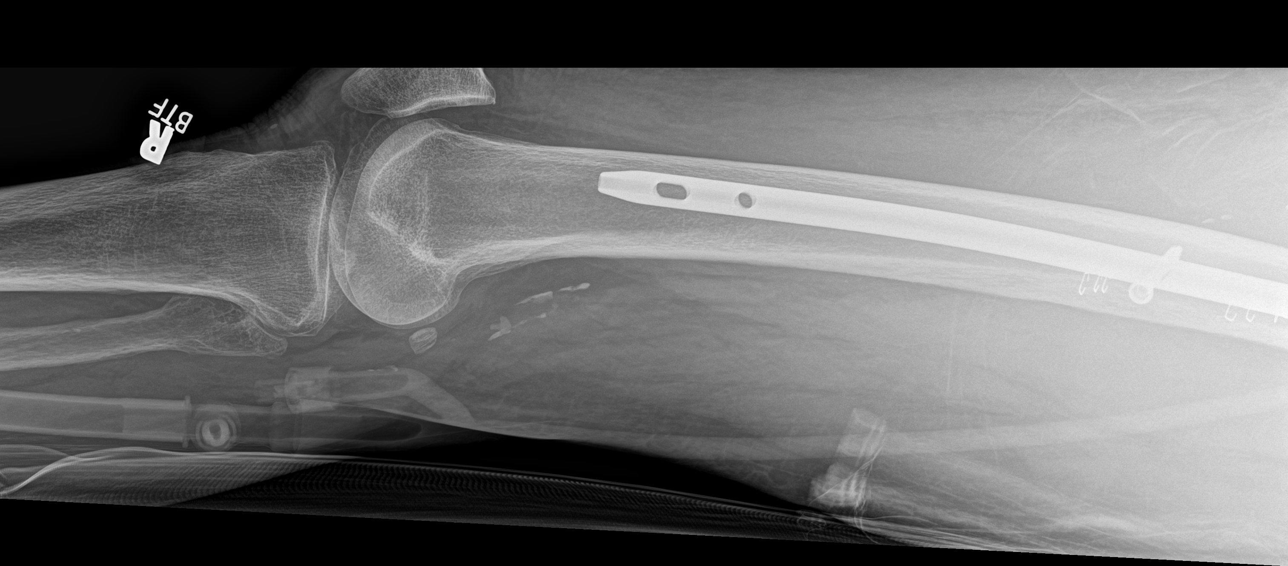

[4 of 4 positions shown; findings below may reference images not displayed]

FINDINGS: IM nail with compression screw at proximal RIGHT femur across a
reduced intertrochanteric fracture.

Locking screw at mid femur.

No additional fracture or dislocation identified.

Joint space narrowing RIGHT knee.

RIGHT hip joint space preserved.
IMPRESSION: Post ORIF intertrochanteric fracture RIGHT femur.

No acute complications.
